# Patient Record
Sex: Female | Born: 1950 | Race: White | Hispanic: No | Marital: Married | Smoking: Never smoker
Health system: Southern US, Community
[De-identification: ages and names within clinical notes are randomized; demographics above are authoritative.]

## PROBLEM LIST (undated history)

## (undated) DIAGNOSIS — Z803 Family history of malignant neoplasm of breast: Secondary | ICD-10-CM

## (undated) DIAGNOSIS — Z8042 Family history of malignant neoplasm of prostate: Secondary | ICD-10-CM

## (undated) DIAGNOSIS — Z8041 Family history of malignant neoplasm of ovary: Secondary | ICD-10-CM

## (undated) DIAGNOSIS — R509 Fever, unspecified: Secondary | ICD-10-CM

## (undated) DIAGNOSIS — R911 Solitary pulmonary nodule: Secondary | ICD-10-CM

## (undated) DIAGNOSIS — C9110 Chronic lymphocytic leukemia of B-cell type not having achieved remission: Secondary | ICD-10-CM

## (undated) DIAGNOSIS — E213 Hyperparathyroidism, unspecified: Secondary | ICD-10-CM

## (undated) DIAGNOSIS — E039 Hypothyroidism, unspecified: Secondary | ICD-10-CM

## (undated) HISTORY — DX: Family history of malignant neoplasm of ovary: Z80.41

## (undated) HISTORY — DX: Family history of malignant neoplasm of breast: Z80.3

## (undated) HISTORY — PX: KNEE ARTHROSCOPY: SHX127

## (undated) HISTORY — PX: BREAST BIOPSY: SHX20

## (undated) HISTORY — PX: THYROIDECTOMY: SHX17

## (undated) HISTORY — DX: Chronic lymphocytic leukemia of B-cell type not having achieved remission: C91.10

## (undated) HISTORY — DX: Hypothyroidism, unspecified: E03.9

## (undated) HISTORY — DX: Hyperparathyroidism, unspecified: E21.3

## (undated) HISTORY — DX: Solitary pulmonary nodule: R91.1

## (undated) HISTORY — DX: Fever, unspecified: R50.9

## (undated) HISTORY — DX: Hypercalcemia: E83.52

## (undated) HISTORY — DX: Family history of malignant neoplasm of prostate: Z80.42

---

## 1998-01-20 ENCOUNTER — Ambulatory Visit (HOSPITAL_COMMUNITY): Admission: RE | Admit: 1998-01-20 | Discharge: 1998-01-20 | Payer: Self-pay | Admitting: Surgery

## 1998-03-02 ENCOUNTER — Ambulatory Visit (HOSPITAL_COMMUNITY): Admission: RE | Admit: 1998-03-02 | Discharge: 1998-03-02 | Payer: Self-pay | Admitting: Obstetrics and Gynecology

## 1998-03-25 ENCOUNTER — Ambulatory Visit (HOSPITAL_COMMUNITY): Admission: RE | Admit: 1998-03-25 | Discharge: 1998-03-25 | Payer: Self-pay | Admitting: Obstetrics and Gynecology

## 1998-03-30 ENCOUNTER — Ambulatory Visit (HOSPITAL_COMMUNITY): Admission: RE | Admit: 1998-03-30 | Discharge: 1998-03-30 | Payer: Self-pay | Admitting: Obstetrics and Gynecology

## 1998-04-01 ENCOUNTER — Other Ambulatory Visit: Admission: RE | Admit: 1998-04-01 | Discharge: 1998-04-01 | Payer: Self-pay | Admitting: Obstetrics and Gynecology

## 1998-12-01 ENCOUNTER — Ambulatory Visit (HOSPITAL_COMMUNITY): Admission: RE | Admit: 1998-12-01 | Discharge: 1998-12-01 | Payer: Self-pay | Admitting: Obstetrics and Gynecology

## 1998-12-01 ENCOUNTER — Encounter: Payer: Self-pay | Admitting: Obstetrics and Gynecology

## 1999-05-23 ENCOUNTER — Other Ambulatory Visit: Admission: RE | Admit: 1999-05-23 | Discharge: 1999-05-23 | Payer: Self-pay | Admitting: Obstetrics and Gynecology

## 1999-06-19 ENCOUNTER — Ambulatory Visit (HOSPITAL_COMMUNITY): Admission: RE | Admit: 1999-06-19 | Discharge: 1999-06-19 | Payer: Self-pay | Admitting: Obstetrics and Gynecology

## 1999-06-19 ENCOUNTER — Encounter: Payer: Self-pay | Admitting: Obstetrics and Gynecology

## 1999-09-05 ENCOUNTER — Other Ambulatory Visit: Admission: RE | Admit: 1999-09-05 | Discharge: 1999-09-05 | Payer: Self-pay | Admitting: Obstetrics and Gynecology

## 1999-09-05 ENCOUNTER — Encounter (INDEPENDENT_AMBULATORY_CARE_PROVIDER_SITE_OTHER): Payer: Self-pay | Admitting: Specialist

## 1999-09-12 ENCOUNTER — Encounter (INDEPENDENT_AMBULATORY_CARE_PROVIDER_SITE_OTHER): Payer: Self-pay

## 1999-09-12 ENCOUNTER — Ambulatory Visit (HOSPITAL_COMMUNITY): Admission: RE | Admit: 1999-09-12 | Discharge: 1999-09-12 | Payer: Self-pay | Admitting: Obstetrics and Gynecology

## 2000-05-23 ENCOUNTER — Other Ambulatory Visit: Admission: RE | Admit: 2000-05-23 | Discharge: 2000-05-23 | Payer: Self-pay | Admitting: Obstetrics and Gynecology

## 2000-09-18 ENCOUNTER — Encounter: Payer: Self-pay | Admitting: Surgery

## 2000-09-18 ENCOUNTER — Observation Stay (HOSPITAL_COMMUNITY): Admission: RE | Admit: 2000-09-18 | Discharge: 2000-09-19 | Payer: Self-pay | Admitting: Surgery

## 2000-09-18 ENCOUNTER — Encounter (INDEPENDENT_AMBULATORY_CARE_PROVIDER_SITE_OTHER): Payer: Self-pay | Admitting: Specialist

## 2000-11-29 ENCOUNTER — Encounter: Payer: Self-pay | Admitting: Obstetrics and Gynecology

## 2000-11-29 ENCOUNTER — Ambulatory Visit (HOSPITAL_COMMUNITY): Admission: RE | Admit: 2000-11-29 | Discharge: 2000-11-29 | Payer: Self-pay | Admitting: Obstetrics and Gynecology

## 2001-08-26 ENCOUNTER — Other Ambulatory Visit: Admission: RE | Admit: 2001-08-26 | Discharge: 2001-08-26 | Payer: Self-pay | Admitting: Obstetrics and Gynecology

## 2002-03-16 ENCOUNTER — Ambulatory Visit (HOSPITAL_COMMUNITY): Admission: RE | Admit: 2002-03-16 | Discharge: 2002-03-16 | Payer: Self-pay | Admitting: Obstetrics and Gynecology

## 2002-03-16 ENCOUNTER — Encounter: Payer: Self-pay | Admitting: Obstetrics and Gynecology

## 2004-02-16 ENCOUNTER — Ambulatory Visit (HOSPITAL_COMMUNITY): Admission: RE | Admit: 2004-02-16 | Discharge: 2004-02-16 | Payer: Self-pay | Admitting: Obstetrics and Gynecology

## 2004-10-16 ENCOUNTER — Ambulatory Visit (HOSPITAL_COMMUNITY): Admission: RE | Admit: 2004-10-16 | Discharge: 2004-10-16 | Payer: Self-pay | Admitting: Gastroenterology

## 2004-10-16 ENCOUNTER — Encounter (INDEPENDENT_AMBULATORY_CARE_PROVIDER_SITE_OTHER): Payer: Self-pay | Admitting: *Deleted

## 2005-04-19 ENCOUNTER — Ambulatory Visit (HOSPITAL_COMMUNITY): Admission: RE | Admit: 2005-04-19 | Discharge: 2005-04-19 | Payer: Self-pay | Admitting: Obstetrics and Gynecology

## 2006-04-23 ENCOUNTER — Ambulatory Visit (HOSPITAL_COMMUNITY): Admission: RE | Admit: 2006-04-23 | Discharge: 2006-04-23 | Payer: Self-pay | Admitting: Obstetrics and Gynecology

## 2006-04-30 ENCOUNTER — Encounter: Admission: RE | Admit: 2006-04-30 | Discharge: 2006-04-30 | Payer: Self-pay | Admitting: Obstetrics and Gynecology

## 2007-05-09 ENCOUNTER — Encounter: Admission: RE | Admit: 2007-05-09 | Discharge: 2007-05-09 | Payer: Self-pay | Admitting: Obstetrics and Gynecology

## 2008-05-10 ENCOUNTER — Encounter: Admission: RE | Admit: 2008-05-10 | Discharge: 2008-05-10 | Payer: Self-pay | Admitting: Obstetrics and Gynecology

## 2008-12-22 ENCOUNTER — Ambulatory Visit: Payer: Self-pay | Admitting: Oncology

## 2008-12-30 ENCOUNTER — Other Ambulatory Visit: Admission: RE | Admit: 2008-12-30 | Discharge: 2008-12-30 | Payer: Self-pay | Admitting: Oncology

## 2008-12-30 ENCOUNTER — Encounter: Payer: Self-pay | Admitting: Oncology

## 2008-12-30 LAB — COMPREHENSIVE METABOLIC PANEL
ALT: 38 U/L — ABNORMAL HIGH (ref 0–35)
CO2: 31 mEq/L (ref 19–32)
Calcium: 9.8 mg/dL (ref 8.4–10.5)
Chloride: 105 mEq/L (ref 96–112)
Creatinine, Ser: 0.6 mg/dL (ref 0.40–1.20)
Glucose, Bld: 102 mg/dL — ABNORMAL HIGH (ref 70–99)
Sodium: 140 mEq/L (ref 135–145)
Total Protein: 6.6 g/dL (ref 6.0–8.3)

## 2008-12-30 LAB — CBC WITH DIFFERENTIAL/PLATELET
Basophils Absolute: 0.1 10*3/uL (ref 0.0–0.1)
EOS%: 2.3 % (ref 0.0–7.0)
HCT: 40.8 % (ref 34.8–46.6)
HGB: 13.7 g/dL (ref 11.6–15.9)
LYMPH%: 56.6 % — ABNORMAL HIGH (ref 14.0–49.7)
MCH: 29.7 pg (ref 25.1–34.0)
MCV: 88.3 fL (ref 79.5–101.0)
MONO%: 3.6 % (ref 0.0–14.0)
NEUT%: 37 % — ABNORMAL LOW (ref 38.4–76.8)
Platelets: 233 10*3/uL (ref 145–400)

## 2008-12-30 LAB — LACTATE DEHYDROGENASE: LDH: 139 U/L (ref 94–250)

## 2008-12-30 LAB — MORPHOLOGY: PLT EST: ADEQUATE

## 2009-01-05 ENCOUNTER — Other Ambulatory Visit: Admission: RE | Admit: 2009-01-05 | Discharge: 2009-01-05 | Payer: Self-pay | Admitting: Oncology

## 2009-01-05 ENCOUNTER — Ambulatory Visit (HOSPITAL_COMMUNITY): Admission: RE | Admit: 2009-01-05 | Discharge: 2009-01-05 | Payer: Self-pay | Admitting: Oncology

## 2009-01-05 ENCOUNTER — Encounter: Payer: Self-pay | Admitting: Oncology

## 2009-01-05 LAB — FLOW CYTOMETRY

## 2009-01-20 LAB — CBC WITH DIFFERENTIAL/PLATELET
BASO%: 0.4 % (ref 0.0–2.0)
EOS%: 1.7 % (ref 0.0–7.0)
MCH: 29.5 pg (ref 25.1–34.0)
MCHC: 33.6 g/dL (ref 31.5–36.0)
RDW: 13.2 % (ref 11.2–14.5)
lymph#: 8.7 10*3/uL — ABNORMAL HIGH (ref 0.9–3.3)

## 2009-01-20 LAB — LACTATE DEHYDROGENASE: LDH: 154 U/L (ref 94–250)

## 2009-02-14 ENCOUNTER — Ambulatory Visit: Payer: Self-pay | Admitting: Oncology

## 2009-02-16 LAB — CBC WITH DIFFERENTIAL/PLATELET
Basophils Absolute: 0 10*3/uL (ref 0.0–0.1)
EOS%: 1.6 % (ref 0.0–7.0)
Eosinophils Absolute: 0.2 10*3/uL (ref 0.0–0.5)
HCT: 39.7 % (ref 34.8–46.6)
HGB: 13.5 g/dL (ref 11.6–15.9)
MCH: 29.6 pg (ref 25.1–34.0)
MONO#: 0.5 10*3/uL (ref 0.1–0.9)
NEUT#: 5.5 10*3/uL (ref 1.5–6.5)
NEUT%: 37.7 % — ABNORMAL LOW (ref 38.4–76.8)
lymph#: 8.4 10*3/uL — ABNORMAL HIGH (ref 0.9–3.3)

## 2009-03-22 ENCOUNTER — Ambulatory Visit: Payer: Self-pay | Admitting: Oncology

## 2009-03-24 LAB — COMPREHENSIVE METABOLIC PANEL
ALT: 32 U/L (ref 0–35)
CO2: 27 mEq/L (ref 19–32)
Creatinine, Ser: 0.65 mg/dL (ref 0.40–1.20)
Total Bilirubin: 0.3 mg/dL (ref 0.3–1.2)

## 2009-03-24 LAB — LACTATE DEHYDROGENASE: LDH: 176 U/L (ref 94–250)

## 2009-03-24 LAB — CBC WITH DIFFERENTIAL/PLATELET
Basophils Absolute: 0 10*3/uL (ref 0.0–0.1)
Eosinophils Absolute: 0.1 10*3/uL (ref 0.0–0.5)
HCT: 41.3 % (ref 34.8–46.6)
HGB: 13.9 g/dL (ref 11.6–15.9)
LYMPH%: 65.8 % — ABNORMAL HIGH (ref 14.0–49.7)
MCV: 87.9 fL (ref 79.5–101.0)
MONO#: 0.3 10*3/uL (ref 0.1–0.9)
MONO%: 2.8 % (ref 0.0–14.0)
NEUT#: 3 10*3/uL (ref 1.5–6.5)
NEUT%: 29.9 % — ABNORMAL LOW (ref 38.4–76.8)
Platelets: 206 10*3/uL (ref 145–400)
WBC: 9.9 10*3/uL (ref 3.9–10.3)

## 2009-03-24 LAB — CHCC SMEAR

## 2009-05-23 ENCOUNTER — Ambulatory Visit: Payer: Self-pay | Admitting: Oncology

## 2009-05-25 LAB — CBC WITH DIFFERENTIAL/PLATELET
Basophils Absolute: 0 10*3/uL (ref 0.0–0.1)
Eosinophils Absolute: 0.1 10*3/uL (ref 0.0–0.5)
HGB: 13.7 g/dL (ref 11.6–15.9)
MCV: 88.5 fL (ref 79.5–101.0)
MONO#: 0.5 10*3/uL (ref 0.1–0.9)
MONO%: 3 % (ref 0.0–14.0)
NEUT#: 5 10*3/uL (ref 1.5–6.5)
RBC: 4.56 10*6/uL (ref 3.70–5.45)
RDW: 13.6 % (ref 11.2–14.5)
WBC: 15.2 10*3/uL — ABNORMAL HIGH (ref 3.9–10.3)
lymph#: 9.6 10*3/uL — ABNORMAL HIGH (ref 0.9–3.3)

## 2009-06-07 ENCOUNTER — Encounter: Admission: RE | Admit: 2009-06-07 | Discharge: 2009-06-07 | Payer: Self-pay | Admitting: Obstetrics and Gynecology

## 2009-08-02 ENCOUNTER — Ambulatory Visit: Payer: Self-pay | Admitting: Oncology

## 2009-08-04 LAB — CBC WITH DIFFERENTIAL/PLATELET
Basophils Absolute: 0 10*3/uL (ref 0.0–0.1)
Eosinophils Absolute: 0.2 10*3/uL (ref 0.0–0.5)
HCT: 42.9 % (ref 34.8–46.6)
HGB: 14.5 g/dL (ref 11.6–15.9)
MCH: 30.4 pg (ref 25.1–34.0)
MONO#: 0.5 10*3/uL (ref 0.1–0.9)
NEUT%: 30.3 % — ABNORMAL LOW (ref 38.4–76.8)
WBC: 16.6 10*3/uL — ABNORMAL HIGH (ref 3.9–10.3)
lymph#: 10.9 10*3/uL — ABNORMAL HIGH (ref 0.9–3.3)

## 2009-08-04 LAB — COMPREHENSIVE METABOLIC PANEL
AST: 26 U/L (ref 0–37)
BUN: 13 mg/dL (ref 6–23)
Calcium: 10.5 mg/dL (ref 8.4–10.5)
Chloride: 105 mEq/L (ref 96–112)
Creatinine, Ser: 0.64 mg/dL (ref 0.40–1.20)
Glucose, Bld: 61 mg/dL — ABNORMAL LOW (ref 70–99)

## 2009-08-04 LAB — MORPHOLOGY: RBC Comments: NORMAL

## 2009-09-22 ENCOUNTER — Ambulatory Visit: Payer: Self-pay | Admitting: Oncology

## 2009-09-27 LAB — CBC WITH DIFFERENTIAL/PLATELET
BASO%: 0.1 % (ref 0.0–2.0)
Basophils Absolute: 0 10*3/uL (ref 0.0–0.1)
LYMPH%: 62.1 % — ABNORMAL HIGH (ref 14.0–49.7)
MCHC: 33.8 g/dL (ref 31.5–36.0)
MONO#: 0.6 10*3/uL (ref 0.1–0.9)
NEUT#: 6.8 10*3/uL — ABNORMAL HIGH (ref 1.5–6.5)
NEUT%: 33.9 % — ABNORMAL LOW (ref 38.4–76.8)
Platelets: 247 10*3/uL (ref 145–400)
WBC: 20.2 10*3/uL — ABNORMAL HIGH (ref 3.9–10.3)
lymph#: 12.5 10*3/uL — ABNORMAL HIGH (ref 0.9–3.3)

## 2009-11-28 ENCOUNTER — Ambulatory Visit: Payer: Self-pay | Admitting: Oncology

## 2009-11-30 LAB — CBC WITH DIFFERENTIAL/PLATELET
Basophils Absolute: 0 10*3/uL (ref 0.0–0.1)
EOS%: 1.3 % (ref 0.0–7.0)
HGB: 13.6 g/dL (ref 11.6–15.9)
LYMPH%: 66 % — ABNORMAL HIGH (ref 14.0–49.7)
MCH: 30.5 pg (ref 25.1–34.0)
MCHC: 33.9 g/dL (ref 31.5–36.0)
MCV: 89.8 fL (ref 79.5–101.0)
MONO#: 0.4 10*3/uL (ref 0.1–0.9)
RBC: 4.45 10*6/uL (ref 3.70–5.45)
lymph#: 12.6 10*3/uL — ABNORMAL HIGH (ref 0.9–3.3)

## 2009-11-30 LAB — COMPREHENSIVE METABOLIC PANEL
ALT: 27 U/L (ref 0–35)
BUN: 14 mg/dL (ref 6–23)
Calcium: 10.1 mg/dL (ref 8.4–10.5)
Chloride: 106 mEq/L (ref 96–112)
Creatinine, Ser: 0.76 mg/dL (ref 0.40–1.20)
Potassium: 4 mEq/L (ref 3.5–5.3)

## 2009-11-30 LAB — LACTATE DEHYDROGENASE: LDH: 149 U/L (ref 94–250)

## 2010-01-27 ENCOUNTER — Ambulatory Visit: Payer: Self-pay | Admitting: Oncology

## 2010-01-30 LAB — CBC WITH DIFFERENTIAL/PLATELET
Eosinophils Absolute: 0.4 10*3/uL (ref 0.0–0.5)
HCT: 41.6 % (ref 34.8–46.6)
MCHC: 33.6 g/dL (ref 31.5–36.0)
MONO#: 0.3 10*3/uL (ref 0.1–0.9)
NEUT%: 26.9 % — ABNORMAL LOW (ref 38.4–76.8)
RBC: 4.61 10*6/uL (ref 3.70–5.45)
RDW: 13.3 % (ref 11.2–14.5)
WBC: 19.1 10*3/uL — ABNORMAL HIGH (ref 3.9–10.3)
lymph#: 13.3 10*3/uL — ABNORMAL HIGH (ref 0.9–3.3)

## 2010-03-24 ENCOUNTER — Ambulatory Visit: Payer: Self-pay | Admitting: Oncology

## 2010-03-29 LAB — CBC WITH DIFFERENTIAL/PLATELET
BASO%: 0.3 % (ref 0.0–2.0)
HCT: 43.3 % (ref 34.8–46.6)
HGB: 14.6 g/dL (ref 11.6–15.9)
LYMPH%: 68.1 % — ABNORMAL HIGH (ref 14.0–49.7)
MCH: 30.3 pg (ref 25.1–34.0)
MCHC: 33.8 g/dL (ref 31.5–36.0)
MONO#: 0.3 10*3/uL (ref 0.1–0.9)
MONO%: 1.4 % (ref 0.0–14.0)
RBC: 4.84 10*6/uL (ref 3.70–5.45)

## 2010-03-29 LAB — COMPREHENSIVE METABOLIC PANEL
ALT: 36 U/L — ABNORMAL HIGH (ref 0–35)
AST: 35 U/L (ref 0–37)
Albumin: 4.3 g/dL (ref 3.5–5.2)
Calcium: 10.6 mg/dL — ABNORMAL HIGH (ref 8.4–10.5)
Creatinine, Ser: 0.71 mg/dL (ref 0.40–1.20)
Glucose, Bld: 87 mg/dL (ref 70–99)
Potassium: 4.3 mEq/L (ref 3.5–5.3)
Sodium: 139 mEq/L (ref 135–145)

## 2010-06-08 ENCOUNTER — Encounter: Admission: RE | Admit: 2010-06-08 | Discharge: 2010-06-08 | Payer: Self-pay | Admitting: Obstetrics and Gynecology

## 2010-06-26 ENCOUNTER — Ambulatory Visit: Payer: Self-pay | Admitting: Oncology

## 2010-06-28 LAB — LACTATE DEHYDROGENASE: LDH: 158 U/L (ref 94–250)

## 2010-06-28 LAB — CBC WITH DIFFERENTIAL/PLATELET
BASO%: 0.3 % (ref 0.0–2.0)
Eosinophils Absolute: 0.1 10*3/uL (ref 0.0–0.5)
MCH: 30.8 pg (ref 25.1–34.0)
MCHC: 34.7 g/dL (ref 31.5–36.0)
MCV: 88.8 fL (ref 79.5–101.0)
MONO%: 2.1 % (ref 0.0–14.0)
NEUT#: 6.9 10*3/uL — ABNORMAL HIGH (ref 1.5–6.5)
NEUT%: 28.1 % — ABNORMAL LOW (ref 38.4–76.8)
Platelets: 278 10*3/uL (ref 145–400)
RBC: 4.78 10*6/uL (ref 3.70–5.45)
RDW: 13.5 % (ref 11.2–14.5)
WBC: 24.6 10*3/uL — ABNORMAL HIGH (ref 3.9–10.3)
lymph#: 17 10*3/uL — ABNORMAL HIGH (ref 0.9–3.3)

## 2010-06-28 LAB — COMPREHENSIVE METABOLIC PANEL
Alkaline Phosphatase: 122 U/L — ABNORMAL HIGH (ref 39–117)
BUN: 11 mg/dL (ref 6–23)
Calcium: 10.5 mg/dL (ref 8.4–10.5)
Chloride: 104 mEq/L (ref 96–112)
Glucose, Bld: 90 mg/dL (ref 70–99)
Potassium: 4.2 mEq/L (ref 3.5–5.3)
Sodium: 139 mEq/L (ref 135–145)
Total Protein: 6.4 g/dL (ref 6.0–8.3)

## 2010-07-06 LAB — VITAMIN D 25 HYDROXY (VIT D DEFICIENCY, FRACTURES): Vit D, 25-Hydroxy: 30 ng/mL (ref 30–89)

## 2010-09-21 ENCOUNTER — Ambulatory Visit: Payer: Self-pay | Admitting: Oncology

## 2010-09-27 LAB — CBC WITH DIFFERENTIAL/PLATELET
BASO%: 0.2 % (ref 0.0–2.0)
Basophils Absolute: 0.1 10*3/uL (ref 0.0–0.1)
EOS%: 0.8 % (ref 0.0–7.0)
Eosinophils Absolute: 0.2 10*3/uL (ref 0.0–0.5)
HCT: 43.7 % (ref 34.8–46.6)
HGB: 14.7 g/dL (ref 11.6–15.9)
LYMPH%: 71.7 % — ABNORMAL HIGH (ref 14.0–49.7)
MCH: 29.8 pg (ref 25.1–34.0)
MCHC: 33.6 g/dL (ref 31.5–36.0)
MCV: 88.6 fL (ref 79.5–101.0)
MONO#: 0.4 10*3/uL (ref 0.1–0.9)
MONO%: 1.7 % (ref 0.0–14.0)
NEUT#: 6.3 10*3/uL (ref 1.5–6.5)
NEUT%: 25.6 % — ABNORMAL LOW (ref 38.4–76.8)
Platelets: 244 10*3/uL (ref 145–400)
RBC: 4.93 10*6/uL (ref 3.70–5.45)
RDW: 13.4 % (ref 11.2–14.5)
WBC: 24.6 10*3/uL — ABNORMAL HIGH (ref 3.9–10.3)
lymph#: 17.6 10*3/uL — ABNORMAL HIGH (ref 0.9–3.3)

## 2010-09-27 LAB — LACTATE DEHYDROGENASE: LDH: 150 U/L (ref 94–250)

## 2010-09-27 LAB — COMPREHENSIVE METABOLIC PANEL
ALT: 42 U/L — ABNORMAL HIGH (ref 0–35)
AST: 27 U/L (ref 0–37)
Albumin: 4.4 g/dL (ref 3.5–5.2)
Alkaline Phosphatase: 108 U/L (ref 39–117)
BUN: 12 mg/dL (ref 6–23)
CO2: 25 mEq/L (ref 19–32)
Calcium: 9.9 mg/dL (ref 8.4–10.5)
Chloride: 103 mEq/L (ref 96–112)
Creatinine, Ser: 0.66 mg/dL (ref 0.40–1.20)
Glucose, Bld: 112 mg/dL — ABNORMAL HIGH (ref 70–99)
Potassium: 4.3 mEq/L (ref 3.5–5.3)
Sodium: 139 mEq/L (ref 135–145)
Total Bilirubin: 0.3 mg/dL (ref 0.3–1.2)
Total Protein: 7 g/dL (ref 6.0–8.3)

## 2010-09-27 LAB — TECHNOLOGIST REVIEW

## 2010-10-15 ENCOUNTER — Encounter: Payer: Self-pay | Admitting: Obstetrics and Gynecology

## 2010-10-16 ENCOUNTER — Encounter: Payer: Self-pay | Admitting: Obstetrics and Gynecology

## 2010-12-27 ENCOUNTER — Encounter (HOSPITAL_BASED_OUTPATIENT_CLINIC_OR_DEPARTMENT_OTHER): Payer: Managed Care, Other (non HMO) | Admitting: Oncology

## 2010-12-27 ENCOUNTER — Other Ambulatory Visit: Payer: Self-pay | Admitting: Oncology

## 2010-12-27 DIAGNOSIS — C9110 Chronic lymphocytic leukemia of B-cell type not having achieved remission: Secondary | ICD-10-CM

## 2010-12-27 LAB — COMPREHENSIVE METABOLIC PANEL
ALT: 26 U/L (ref 0–35)
AST: 24 U/L (ref 0–37)
Albumin: 4.2 g/dL (ref 3.5–5.2)
Chloride: 105 mEq/L (ref 96–112)
Creatinine, Ser: 0.83 mg/dL (ref 0.40–1.20)
Glucose, Bld: 127 mg/dL — ABNORMAL HIGH (ref 70–99)

## 2010-12-27 LAB — CBC WITH DIFFERENTIAL/PLATELET
BASO%: 0.2 % (ref 0.0–2.0)
Basophils Absolute: 0.1 10*3/uL (ref 0.0–0.1)
EOS%: 0.8 % (ref 0.0–7.0)
HCT: 41 % (ref 34.8–46.6)
MCH: 30.1 pg (ref 25.1–34.0)
MONO%: 1.7 % (ref 0.0–14.0)
NEUT#: 6.6 10*3/uL — ABNORMAL HIGH (ref 1.5–6.5)
NEUT%: 27.9 % — ABNORMAL LOW (ref 38.4–76.8)
RDW: 13.5 % (ref 11.2–14.5)

## 2010-12-27 LAB — LACTATE DEHYDROGENASE: LDH: 148 U/L (ref 94–250)

## 2011-01-03 LAB — DIFFERENTIAL
Basophils Relative: 0 % (ref 0–1)
Eosinophils Relative: 1 % (ref 0–5)
Lymphocytes Relative: 53 % — ABNORMAL HIGH (ref 12–46)
Lymphs Abs: 8.1 10*3/uL — ABNORMAL HIGH (ref 0.7–4.0)
Monocytes Relative: 3 % (ref 3–12)
Neutrophils Relative %: 43 % (ref 43–77)

## 2011-01-03 LAB — TISSUE HYBRIDIZATION (BONE MARROW)-NCBH

## 2011-01-03 LAB — CBC: WBC: 15.4 10*3/uL — ABNORMAL HIGH (ref 4.0–10.5)

## 2011-02-09 NOTE — Op Note (Signed)
[name], Kaitlin Barnes                 ACCOUNT NO.:  [id number]   MEDICAL RECORD NO.:  [id number]          PATIENT TYPE:  AMB   LOCATION:  ENDO                         FACILITY:  [facility]   PHYSICIAN:  Petra Kuba, M.D.    DATE OF BIRTH:  March 23, 1951   DATE OF PROCEDURE:  10/16/2004  DATE OF DISCHARGE:                                 OPERATIVE REPORT   PROCEDURE:  Colonoscopy.   INDICATION:  Screening.   Consent was signed after the risks, benefits, methods, and options were  thoroughly discussed in the office.   MEDICINE USED:  Demerol 100 mg, Versed 10 mg.   DESCRIPTION OF PROCEDURE:  Rectal inspection was pertinent for external  hemorrhoids.  Digital exam was negative.  The pediatric video adjustable  colonoscope was inserted and with abdominal pressure and rolling her on her  back was able to be advanced to the cecum.  On insertion in the mid  transverse, a small polyp was seen and was hot biopsied x1.  No other  abnormalities were seen as we advanced to the cecum which was identified by  the appendiceal orifice and the ileocecal valve.  The scope was slowly  withdrawn.  The prep was adequate, there was some liquid stool that required  washing and suctioning.  There was a tiny polyp in the proximal ascending  which was cold biopsied x2 and put in the same container.  The scope was  slowly withdrawn and no other abnormalities were seen.  As we withdrew back  to the polyp that was hot biopsied no obvious residual polypoid tissue was  seen.  The scope was slowly withdrawn and in the mid descending another  tiny, small polyp was seen and was hot biopsied x1 and put in a separate  container.  The scope was further withdrawn, no additional findings were  seen.  As we slowly withdrew back to the rectum, anorectal pull-through and  retroflexion confirmed some small hemorrhoids.  The scope was  straightened  and readvanced to the left side of the colon, air was suctioned and the  scope  removed.  The patient tolerated the procedure well and there were no  obvious immediate complications.   ENDOSCOPIC DIAGNOSES:  1.  Internal and external hemorrhoids.  2.  Three tiny, small polyps, cold biopsied in the ascending, hot biopsied      in the transverse and descending.  3.  Otherwise within normal limits to the rectum.   PLAN:  Await pathology, probably recheck colon in 5 years, happy to see back  p.r.n., otherwise return care to Dr. Tenny Craw with customary healthcare  maintenance to include yearly rectals and guaiacs.                                               ______________________________  Petra Kuba, M.D.    MEM/MEDQ  D:  10/16/2004  T:  10/16/2004  Job:  [id number]   cc:   C.  Duane Lope, M.D.  [address]  [place]  [place] [address]  Fax: [phone]

## 2011-04-11 ENCOUNTER — Other Ambulatory Visit: Payer: Self-pay | Admitting: Oncology

## 2011-04-11 ENCOUNTER — Encounter (HOSPITAL_BASED_OUTPATIENT_CLINIC_OR_DEPARTMENT_OTHER): Payer: BC Managed Care – PPO | Admitting: Oncology

## 2011-04-11 DIAGNOSIS — C9110 Chronic lymphocytic leukemia of B-cell type not having achieved remission: Secondary | ICD-10-CM

## 2011-04-11 LAB — COMPREHENSIVE METABOLIC PANEL
ALT: 29 U/L (ref 0–35)
CO2: 26 mEq/L (ref 19–32)
Creatinine, Ser: 0.68 mg/dL (ref 0.50–1.10)
Total Bilirubin: 0.3 mg/dL (ref 0.3–1.2)

## 2011-04-11 LAB — CBC WITH DIFFERENTIAL/PLATELET
BASO%: 0.2 % (ref 0.0–2.0)
HCT: 40.6 % (ref 34.8–46.6)
LYMPH%: 70.5 % — ABNORMAL HIGH (ref 14.0–49.7)
MCH: 30.6 pg (ref 25.1–34.0)
MCHC: 34 g/dL (ref 31.5–36.0)
MCV: 89.9 fL (ref 79.5–101.0)
MONO#: 0.2 10*3/uL (ref 0.1–0.9)
NEUT%: 27.7 % — ABNORMAL LOW (ref 38.4–76.8)
Platelets: 231 10*3/uL (ref 145–400)
WBC: 24 10*3/uL — ABNORMAL HIGH (ref 3.9–10.3)

## 2011-04-11 LAB — LACTATE DEHYDROGENASE: LDH: 153 U/L (ref 94–250)

## 2011-05-10 ENCOUNTER — Other Ambulatory Visit: Payer: Self-pay | Admitting: Obstetrics and Gynecology

## 2011-05-10 DIAGNOSIS — Z1231 Encounter for screening mammogram for malignant neoplasm of breast: Secondary | ICD-10-CM

## 2011-06-12 ENCOUNTER — Ambulatory Visit: Payer: BC Managed Care – PPO

## 2011-06-15 ENCOUNTER — Ambulatory Visit
Admission: RE | Admit: 2011-06-15 | Discharge: 2011-06-15 | Disposition: A | Discharge status: Home / Self Care | Payer: BC Managed Care – PPO | Source: Ambulatory Visit | Attending: Obstetrics and Gynecology | Admitting: Obstetrics and Gynecology

## 2011-06-15 DIAGNOSIS — Z1231 Encounter for screening mammogram for malignant neoplasm of breast: Secondary | ICD-10-CM

## 2011-07-09 ENCOUNTER — Other Ambulatory Visit: Payer: Self-pay | Admitting: Oncology

## 2011-07-09 ENCOUNTER — Encounter (HOSPITAL_BASED_OUTPATIENT_CLINIC_OR_DEPARTMENT_OTHER): Payer: BC Managed Care – PPO | Admitting: Oncology

## 2011-07-09 DIAGNOSIS — D689 Coagulation defect, unspecified: Secondary | ICD-10-CM

## 2011-07-09 DIAGNOSIS — C9110 Chronic lymphocytic leukemia of B-cell type not having achieved remission: Secondary | ICD-10-CM

## 2011-07-09 LAB — CBC WITH DIFFERENTIAL/PLATELET
BASO%: 0.3 % (ref 0.0–2.0)
EOS%: 0.7 % (ref 0.0–7.0)
Eosinophils Absolute: 0.2 10*3/uL (ref 0.0–0.5)
LYMPH%: 72.5 % — ABNORMAL HIGH (ref 14.0–49.7)
MCH: 30.7 pg (ref 25.1–34.0)
MCHC: 34.1 g/dL (ref 31.5–36.0)
MCV: 90 fL (ref 79.5–101.0)
MONO%: 1.3 % (ref 0.0–14.0)
NEUT#: 7.1 10*3/uL — ABNORMAL HIGH (ref 1.5–6.5)
RBC: 4.67 10*6/uL (ref 3.70–5.45)
RDW: 13.5 % (ref 11.2–14.5)

## 2011-07-26 ENCOUNTER — Other Ambulatory Visit: Payer: Self-pay | Admitting: Obstetrics and Gynecology

## 2011-09-08 ENCOUNTER — Telehealth: Payer: Self-pay | Admitting: Oncology

## 2011-09-08 NOTE — Telephone Encounter (Signed)
lmonvm adviisng the pt of her jan 2013 appts and to pick up the rest of her appts at that time

## 2011-10-06 ENCOUNTER — Encounter: Payer: Self-pay | Admitting: Oncology

## 2011-10-08 ENCOUNTER — Telehealth: Payer: Self-pay | Admitting: Oncology

## 2011-10-08 ENCOUNTER — Other Ambulatory Visit: Payer: Self-pay | Admitting: Oncology

## 2011-10-08 ENCOUNTER — Other Ambulatory Visit: Payer: BC Managed Care – PPO | Admitting: Lab

## 2011-10-08 ENCOUNTER — Ambulatory Visit (HOSPITAL_BASED_OUTPATIENT_CLINIC_OR_DEPARTMENT_OTHER): Payer: BC Managed Care – PPO | Admitting: Oncology

## 2011-10-08 VITALS — BP 129/73 | HR 79 | Temp 97.5°F | Ht 63.0 in | Wt 178.1 lb

## 2011-10-08 DIAGNOSIS — E039 Hypothyroidism, unspecified: Secondary | ICD-10-CM

## 2011-10-08 DIAGNOSIS — C9110 Chronic lymphocytic leukemia of B-cell type not having achieved remission: Secondary | ICD-10-CM

## 2011-10-08 DIAGNOSIS — D689 Coagulation defect, unspecified: Secondary | ICD-10-CM

## 2011-10-08 LAB — CBC WITH DIFFERENTIAL/PLATELET
BASO%: 0.2 % (ref 0.0–2.0)
Eosinophils Absolute: 0.2 10*3/uL (ref 0.0–0.5)
MCHC: 33.8 g/dL (ref 31.5–36.0)
MONO#: 0.6 10*3/uL (ref 0.1–0.9)
NEUT#: 7.9 10*3/uL — ABNORMAL HIGH (ref 1.5–6.5)
RBC: 4.48 10*6/uL (ref 3.70–5.45)
RDW: 13.5 % (ref 11.2–14.5)
WBC: 27 10*3/uL — ABNORMAL HIGH (ref 3.9–10.3)

## 2011-10-08 LAB — COMPREHENSIVE METABOLIC PANEL
ALT: 39 U/L — ABNORMAL HIGH (ref 0–35)
AST: 35 U/L (ref 0–37)
CO2: 29 mEq/L (ref 19–32)
Calcium: 10 mg/dL (ref 8.4–10.5)
Chloride: 107 mEq/L (ref 96–112)
Creatinine, Ser: 0.87 mg/dL (ref 0.50–1.10)
Sodium: 143 mEq/L (ref 135–145)
Total Bilirubin: 0.3 mg/dL (ref 0.3–1.2)
Total Protein: 6.1 g/dL (ref 6.0–8.3)

## 2011-10-08 LAB — CHCC SMEAR

## 2011-10-08 LAB — LACTATE DEHYDROGENASE: LDH: 151 U/L (ref 94–250)

## 2011-10-08 NOTE — Telephone Encounter (Signed)
appt made for 5/13 for lab and 9/13 for lab and md   aom prinred

## 2011-10-09 NOTE — Progress Notes (Signed)
[facility] OFFICE PROGRESS NOTE  Cc:  [name], MD, MD  DIAGNOSIS: Stage 0 chronic lymphocytic leukemia.  CURRENT THERAPY:  watchful observation.  INTERVAL HISTORY: JALISE ZAWISTOWSKI 61 y.o. female returns for regular follow up.  She reports feeling well.  She has been in retirement.  She spends a lot of time volunteering with her church.  She has chronic fatigue that predated the diagnosis of CLL; she is able to be independent of all activities of daily living.  She denies anorexia, weight loss, night sweat, palpable node swelling, CP, SOB, DOE, palpitation, abdominal pain, nausea/vomiting, hematemasis, hemoptysis, melena, hematochezia, hematuria, vaginal bleeding, skin rash, joint pain, joint swelling, diarrhea, constipation, heat/cold intolerance, depression.   MEDICAL HISTORY: Past Medical History  Diagnosis Date  . CLL (chronic lymphoblastic leukemia)   . Hypothyroidism   . Hypercalcemia   . Hyperparathyroidism, unspecified     SURGICAL HISTORY: No past surgical history on file.  MEDICATIONS: Current Outpatient Prescriptions  Medication Sig Dispense Refill  . alendronate (FOSAMAX) 35 MG tablet Take 35 mg by mouth every 7 (seven) days. Take with a full glass of water on an empty stomach.      Marland Kitchen aspirin 81 MG tablet Take 160 mg by mouth daily.      . cholecalciferol (VITAMIN D) 1000 UNITS tablet Take 1,000 Units by mouth daily.      . ferrous sulfate dried (SLOW FE) 160 (50 FE) MG TBCR Take 160 mg by mouth daily.      Marland Kitchen levothyroxine (SYNTHROID, LEVOTHROID) 88 MCG tablet Take 88 mcg by mouth daily.      . Multiple Vitamin (MULTIVITAMIN) tablet Take 1 tablet by mouth daily.        ALLERGIES:   has no known allergies.  REVIEW OF SYSTEMS:  The rest of the 14-point review of system was negative.   Filed Vitals:   10/08/11 1448  BP: 129/73  Pulse: 79  Temp: 97.5 F (36.4 C)   Wt Readings from Last 3 Encounters:  10/08/11 178 lb 1.6 oz (80.786 kg)    ECOG Performance status: 0  PHYSICAL EXAMINATION:  General:  well-nourished in no acute distress.  Eyes:  no scleral icterus.  ENT:  There were no oropharyngeal lesions.  Neck was without thyromegaly.  Lymphatics:  Negative cervical, supraclavicular or axillary adenopathy.  Respiratory: lungs were clear bilaterally without wheezing or crackles.  Cardiovascular:  Regular rate and rhythm, S1/S2, without murmur, rub or gallop.  There was no pedal edema.  GI:  abdomen was soft, flat, nontender, nondistended, without organomegaly.  Muscoloskeletal:  no spinal tenderness of palpation of vertebral spine.  Skin exam was without echymosis, petichae.  Neuro exam was nonfocal.  Patient was able to get on and off exam table without assistance.  Gait was normal.  Patient was alerted and oriented.  Attention was good.   Language was appropriate.  Mood was normal without depression.  Speech was not pressured.  Thought content was not tangential.      LABORATORY/RADIOLOGY DATA:  Lab Results  Component Value Date   WBC 27.0* 10/08/2011   HGB 13.8 10/08/2011   HCT 40.8 10/08/2011   PLT 230 10/08/2011   GLUCOSE 75 10/08/2011   ALT 39* 10/08/2011   AST 35 10/08/2011   NA 143 10/08/2011   K 4.2 10/08/2011   CL 107 10/08/2011   CREATININE 0.87 10/08/2011   BUN 17 10/08/2011   CO2 29 10/08/2011     ASSESSMENT AND PLAN:  1. CLL Stage 0.  I discussed with Ms. Junio that per clinical history, physical exam, laboratory tests, she has still stage 0 CLL.  She does not have  lymphadenopathy, splenomegaly, anemia, thrombocytopenia.  Her fatigue is not B symptom to indicate treatment at this time for CLL.  I recommended again watchful observation.  2. Hypothyroidism.  She is on levothyroxine  per Dr. Sharl Ma her PCP.  Supposedly these are being titrated. 3. History of hyper calcium with elevated PTH.  I deferred to Dr. Sharl Ma for treatment.  She is on Fosamax.  I am unsure whether she has osteoporosis or not.  4. Primary care:   She is one year overdue for her age-appropriate colon cancer screening.  She claims that she is up-to-date with her mammogram and she is overdue for her Pap smear.  Would encourage her to discuss with Dr. Tenny Craw for arrangement.    5. Followup:  She has an appointment with Eunice Blase, PA-C in six months and with me in one year.   She has appointments for lab only in 3 months and 9 months.

## 2012-01-07 ENCOUNTER — Other Ambulatory Visit: Payer: BC Managed Care – PPO | Admitting: Lab

## 2012-02-05 ENCOUNTER — Other Ambulatory Visit (HOSPITAL_BASED_OUTPATIENT_CLINIC_OR_DEPARTMENT_OTHER): Payer: BC Managed Care – PPO | Admitting: Lab

## 2012-02-05 DIAGNOSIS — C9110 Chronic lymphocytic leukemia of B-cell type not having achieved remission: Secondary | ICD-10-CM

## 2012-02-05 LAB — CBC WITH DIFFERENTIAL/PLATELET
BASO%: 0.2 % (ref 0.0–2.0)
Basophils Absolute: 0 10*3/uL (ref 0.0–0.1)
EOS%: 0.8 % (ref 0.0–7.0)
HCT: 41.5 % (ref 34.8–46.6)
HGB: 13.5 g/dL (ref 11.6–15.9)
MCH: 29.8 pg (ref 25.1–34.0)
MCHC: 32.5 g/dL (ref 31.5–36.0)
MCV: 91.9 fL (ref 79.5–101.0)
MONO%: 1.8 % (ref 0.0–14.0)
NEUT%: 22.8 % — ABNORMAL LOW (ref 38.4–76.8)
lymph#: 20.5 10*3/uL — ABNORMAL HIGH (ref 0.9–3.3)

## 2012-02-12 ENCOUNTER — Telehealth: Payer: Self-pay

## 2012-02-12 NOTE — Telephone Encounter (Signed)
Message copied by Kallie Locks on Tue Feb 12, 2012  3:08 PM ------      Message from: Jethro Bolus T      Created: Tue Feb 05, 2012  3:02 PM       Please call pt.  Her leukocytosis is stable (this is from CLL).  As she has had not symptoms, we have been observing her.  Thanks.

## 2012-04-07 ENCOUNTER — Ambulatory Visit: Payer: BC Managed Care – PPO | Admitting: Oncology

## 2012-04-07 ENCOUNTER — Other Ambulatory Visit: Payer: BC Managed Care – PPO

## 2012-05-21 ENCOUNTER — Other Ambulatory Visit: Payer: Self-pay | Admitting: Obstetrics and Gynecology

## 2012-05-21 DIAGNOSIS — Z1231 Encounter for screening mammogram for malignant neoplasm of breast: Secondary | ICD-10-CM

## 2012-06-12 ENCOUNTER — Encounter: Payer: Self-pay | Admitting: Oncology

## 2012-06-12 ENCOUNTER — Ambulatory Visit (HOSPITAL_BASED_OUTPATIENT_CLINIC_OR_DEPARTMENT_OTHER): Payer: BC Managed Care – PPO | Admitting: Oncology

## 2012-06-12 ENCOUNTER — Other Ambulatory Visit (HOSPITAL_BASED_OUTPATIENT_CLINIC_OR_DEPARTMENT_OTHER): Payer: BC Managed Care – PPO | Admitting: Lab

## 2012-06-12 ENCOUNTER — Telehealth: Payer: Self-pay | Admitting: Oncology

## 2012-06-12 VITALS — BP 144/80 | HR 83 | Temp 97.8°F | Resp 20 | Ht 63.0 in | Wt 168.4 lb

## 2012-06-12 DIAGNOSIS — C9110 Chronic lymphocytic leukemia of B-cell type not having achieved remission: Secondary | ICD-10-CM

## 2012-06-12 DIAGNOSIS — R5381 Other malaise: Secondary | ICD-10-CM

## 2012-06-12 HISTORY — DX: Chronic lymphocytic leukemia of B-cell type not having achieved remission: C91.10

## 2012-06-12 LAB — CBC WITH DIFFERENTIAL/PLATELET
BASO%: 0.2 % (ref 0.0–2.0)
EOS%: 0.6 % (ref 0.0–7.0)
HCT: 42 % (ref 34.8–46.6)
LYMPH%: 77.6 % — ABNORMAL HIGH (ref 14.0–49.7)
MCH: 30.3 pg (ref 25.1–34.0)
MCHC: 33.2 g/dL (ref 31.5–36.0)
MCV: 91.4 fL (ref 79.5–101.0)
MONO%: 1.5 % (ref 0.0–14.0)
NEUT%: 20.1 % — ABNORMAL LOW (ref 38.4–76.8)
Platelets: 209 10*3/uL (ref 145–400)
RBC: 4.6 10*6/uL (ref 3.70–5.45)

## 2012-06-12 LAB — COMPREHENSIVE METABOLIC PANEL (CC13)
ALT: 49 U/L (ref 0–55)
AST: 28 U/L (ref 5–34)
Alkaline Phosphatase: 112 U/L (ref 40–150)
CO2: 29 mEq/L (ref 22–29)
Creatinine: 0.9 mg/dL (ref 0.6–1.1)
Sodium: 142 mEq/L (ref 136–145)
Total Bilirubin: 0.3 mg/dL (ref 0.20–1.20)

## 2012-06-12 LAB — LACTATE DEHYDROGENASE (CC13): LDH: 171 U/L (ref 125–220)

## 2012-06-12 NOTE — Progress Notes (Signed)
[facility]  Telephone:([phone] Fax:([phone]   OFFICE PROGRESS NOTE   Cc:  [name], MD  DIAGNOSIS: Stage 0 chronic lymphocytic leukemia.   CURRENT THERAPY: watchful observation.  INTERVAL HISTORY: Kaitlin Barnes 61 y.o. female returns for regular follow up.  She again reports fatigue.  However, she is still able to perform all chores around the house and activities of daily living.  She has been volunteering a lot of her time at her church. She has been trying to lose weight by diet and exercise and has lost weight successfully.    Patient denies fever, anorexia, weight loss, headache, visual changes, confusion, drenching night sweats, palpable lymph node swelling, mucositis, odynophagia, dysphagia, nausea vomiting, jaundice, chest pain, palpitation, shortness of breath, dyspnea on exertion, productive cough, gum bleeding, epistaxis, hematemesis, hemoptysis, abdominal pain, abdominal swelling, early satiety, melena, hematochezia, hematuria, skin rash, spontaneous bleeding, joint swelling, joint pain, heat or cold intolerance, bowel bladder incontinence, back pain, focal motor weakness, paresthesia, depression, suicidal or homicidal ideation, feeling hopelessness.   Past Medical History  Diagnosis Date  . CLL (chronic lymphoblastic leukemia)   . Hypothyroidism   . Hypercalcemia   . Hyperparathyroidism, unspecified   . CLL (chronic lymphocytic leukemia) 06/12/2012    No past surgical history on file.  Current Outpatient Prescriptions  Medication Sig Dispense Refill  . alendronate (FOSAMAX) 35 MG tablet Take 35 mg by mouth every 7 (seven) days. Take with a full glass of water on an empty stomach.      Marland Kitchen aspirin 81 MG tablet Take 160 mg by mouth daily.      . cholecalciferol (VITAMIN D) 1000 UNITS tablet Take 1,000 Units by mouth daily.      . ferrous sulfate dried (SLOW FE) 160 (50 FE) MG TBCR Take 160 mg by mouth daily.      Marland Kitchen levothyroxine  (SYNTHROID, LEVOTHROID) 88 MCG tablet Take 88 mcg by mouth daily.      . Multiple Vitamin (MULTIVITAMIN) tablet Take 1 tablet by mouth daily.        ALLERGIES:   has no known allergies.  REVIEW OF SYSTEMS:  The rest of the 14-point review of system was negative.   Filed Vitals:   06/12/12 1514  BP: 144/80  Pulse: 83  Temp: 97.8 F (36.6 C)  Resp: 20   Wt Readings from Last 3 Encounters:  06/12/12 168 lb 6.4 oz (76.386 kg)  10/08/11 178 lb 1.6 oz (80.786 kg)   ECOG Performance status: 1  PHYSICAL EXAMINATION:   General:  well-nourished woman, in no acute distress.  Eyes:  no scleral icterus.  ENT:  There were no oropharyngeal lesions.  Neck was without thyromegaly.  Lymphatics:  Negative cervical, supraclavicular or axillary adenopathy.  Respiratory: lungs were clear bilaterally without wheezing or crackles.  Cardiovascular:  Regular rate and rhythm, S1/S2, without murmur, rub or gallop.  There was no pedal edema.  GI:  abdomen was soft, flat, nontender, nondistended, without organomegaly.  Muscoloskeletal:  no spinal tenderness of palpation of vertebral spine.  Skin exam was without echymosis, petichae.  Neuro exam was nonfocal.  Patient was able to get on and off exam table without assistance.  Gait was normal.  Patient was alerted and oriented.  Attention was good.   Language was appropriate.  Mood was normal without depression.  Speech was not pressured.  Thought content was not tangential.         LABORATORY/RADIOLOGY DATA:  Lab Results  Component Value Date   WBC 32.1* 06/12/2012   HGB 14.0 06/12/2012   HCT 42.0 06/12/2012   PLT 209 06/12/2012   GLUCOSE 102* 06/12/2012   ALKPHOS 112 06/12/2012   ALT 49 06/12/2012   AST 28 06/12/2012   NA 142 06/12/2012   K 4.2 06/12/2012   CL 106 06/12/2012   CREATININE 0.9 06/12/2012   BUN 16.0 06/12/2012   CO2 29 06/12/2012    ASSESSMENT AND PLAN:   1. CLL Stage 0.  Again, there is no indication for treatment.  Her lymphocytes is now  higher than before.  However, her doubling time is 3 years.  She has mild fatigue; but she is very active.  This mild level of fatigue alone without cytopenia, recurrent infection, bulky adenopathy does not warrant treatment with chemo yet.  She agreed to continue to observe.   2. Hypothyroidism. She is on levothyroxine per Dr. Sharl Ma.  3. History of hypercalcemia with elevated PTH. I deferred to Dr. Sharl Ma for treatment. She is on Fosamax. I am unsure whether she has osteoporosis or not.  4. Age appropriate cancer screening: mammogram next month.  Colonoscopy is not due yet per her report.  She has had multiple neg PAPsmear and no longer needs them per her report.  5. Followup: lab only appointment in 6 months; and return visit in 1 year.     The length of time of the face-to-face encounter was 15  minutes. More than 50% of time was spent counseling and coordination of care.

## 2012-06-12 NOTE — Telephone Encounter (Signed)
Gave pt appt calendar for lab on March 2014

## 2012-06-12 NOTE — Patient Instructions (Signed)
1.  CLL - stage 0. Slight elevation in lymphocytes counts but still no indication for treatment.  There is no severe constitutional symptoms, recurrent infection, large node swelling.   2.  Follow up:  Lab only in about 6 months. Return visit in about 1 year.

## 2012-06-17 ENCOUNTER — Ambulatory Visit
Admission: RE | Admit: 2012-06-17 | Discharge: 2012-06-17 | Disposition: A | Discharge status: Home / Self Care | Payer: BC Managed Care – PPO | Source: Ambulatory Visit | Attending: Obstetrics and Gynecology | Admitting: Obstetrics and Gynecology

## 2012-06-17 DIAGNOSIS — Z1231 Encounter for screening mammogram for malignant neoplasm of breast: Secondary | ICD-10-CM

## 2012-06-18 ENCOUNTER — Other Ambulatory Visit: Payer: Self-pay | Admitting: Obstetrics and Gynecology

## 2012-06-18 DIAGNOSIS — N63 Unspecified lump in unspecified breast: Secondary | ICD-10-CM

## 2012-07-15 ENCOUNTER — Ambulatory Visit
Admission: RE | Admit: 2012-07-15 | Discharge: 2012-07-15 | Disposition: A | Discharge status: Home / Self Care | Payer: BC Managed Care – PPO | Source: Ambulatory Visit | Attending: Obstetrics and Gynecology | Admitting: Obstetrics and Gynecology

## 2012-07-15 DIAGNOSIS — N63 Unspecified lump in unspecified breast: Secondary | ICD-10-CM

## 2012-11-08 ENCOUNTER — Other Ambulatory Visit: Payer: Self-pay

## 2012-12-11 ENCOUNTER — Other Ambulatory Visit (HOSPITAL_BASED_OUTPATIENT_CLINIC_OR_DEPARTMENT_OTHER): Payer: BC Managed Care – PPO | Admitting: Lab

## 2012-12-11 DIAGNOSIS — C9110 Chronic lymphocytic leukemia of B-cell type not having achieved remission: Secondary | ICD-10-CM

## 2012-12-11 LAB — CBC WITH DIFFERENTIAL/PLATELET
Basophils Absolute: 0.1 10*3/uL (ref 0.0–0.1)
Eosinophils Absolute: 0.2 10*3/uL (ref 0.0–0.5)
HCT: 42.8 % (ref 34.8–46.6)
HGB: 14.2 g/dL (ref 11.6–15.9)
MCV: 90 fL (ref 79.5–101.0)
MONO%: 1.8 % (ref 0.0–14.0)
NEUT#: 6.2 10*3/uL (ref 1.5–6.5)
NEUT%: 19.3 % — ABNORMAL LOW (ref 38.4–76.8)
RDW: 13 % (ref 11.2–14.5)
lymph#: 25.2 10*3/uL — ABNORMAL HIGH (ref 0.9–3.3)

## 2012-12-11 LAB — COMPREHENSIVE METABOLIC PANEL (CC13)
Albumin: 3.7 g/dL (ref 3.5–5.0)
BUN: 16 mg/dL (ref 7.0–26.0)
Calcium: 10.7 mg/dL — ABNORMAL HIGH (ref 8.4–10.4)
Chloride: 104 mEq/L (ref 98–107)
Glucose: 94 mg/dl (ref 70–99)
Potassium: 4.3 mEq/L (ref 3.5–5.1)

## 2012-12-11 LAB — TECHNOLOGIST REVIEW

## 2013-06-09 ENCOUNTER — Telehealth: Payer: Self-pay | Admitting: Hematology and Oncology

## 2013-06-09 NOTE — Telephone Encounter (Signed)
Moved 9/18 appt to 10/23 lb/NG. S/w pt re new d/for 10/23 @ 11:30am.

## 2013-06-11 ENCOUNTER — Ambulatory Visit: Payer: BC Managed Care – PPO | Admitting: Oncology

## 2013-06-11 ENCOUNTER — Other Ambulatory Visit: Payer: BC Managed Care – PPO | Admitting: Lab

## 2013-06-29 ENCOUNTER — Other Ambulatory Visit: Payer: Self-pay

## 2013-06-29 DIAGNOSIS — Z1231 Encounter for screening mammogram for malignant neoplasm of breast: Secondary | ICD-10-CM

## 2013-07-15 ENCOUNTER — Other Ambulatory Visit: Payer: Self-pay | Admitting: Hematology and Oncology

## 2013-07-15 DIAGNOSIS — C9110 Chronic lymphocytic leukemia of B-cell type not having achieved remission: Secondary | ICD-10-CM

## 2013-07-16 ENCOUNTER — Encounter: Payer: Self-pay | Admitting: Hematology and Oncology

## 2013-07-16 ENCOUNTER — Telehealth: Payer: Self-pay | Admitting: *Deleted

## 2013-07-16 ENCOUNTER — Other Ambulatory Visit (HOSPITAL_BASED_OUTPATIENT_CLINIC_OR_DEPARTMENT_OTHER): Payer: BC Managed Care – PPO | Admitting: Lab

## 2013-07-16 ENCOUNTER — Ambulatory Visit (HOSPITAL_BASED_OUTPATIENT_CLINIC_OR_DEPARTMENT_OTHER): Payer: BC Managed Care – PPO | Admitting: Hematology and Oncology

## 2013-07-16 VITALS — BP 158/88 | HR 70 | Temp 97.2°F | Resp 20 | Ht 63.0 in | Wt 175.6 lb

## 2013-07-16 DIAGNOSIS — C9110 Chronic lymphocytic leukemia of B-cell type not having achieved remission: Secondary | ICD-10-CM

## 2013-07-16 DIAGNOSIS — R911 Solitary pulmonary nodule: Secondary | ICD-10-CM

## 2013-07-16 HISTORY — DX: Solitary pulmonary nodule: R91.1

## 2013-07-16 LAB — CBC WITH DIFFERENTIAL/PLATELET
BASO%: 0.1 % (ref 0.0–2.0)
Basophils Absolute: 0 10*3/uL (ref 0.0–0.1)
EOS%: 0.6 % (ref 0.0–7.0)
HCT: 42.4 % (ref 34.8–46.6)
HGB: 13.9 g/dL (ref 11.6–15.9)
MCH: 29.6 pg (ref 25.1–34.0)
MONO#: 0.5 10*3/uL (ref 0.1–0.9)
NEUT%: 17.3 % — ABNORMAL LOW (ref 38.4–76.8)
RBC: 4.7 10*6/uL (ref 3.70–5.45)
RDW: 13.1 % (ref 11.2–14.5)
WBC: 29.7 10*3/uL — ABNORMAL HIGH (ref 3.9–10.3)
lymph#: 23.8 10*3/uL — ABNORMAL HIGH (ref 0.9–3.3)

## 2013-07-16 LAB — COMPREHENSIVE METABOLIC PANEL (CC13)
ALT: 41 U/L (ref 0–55)
AST: 29 U/L (ref 5–34)
Albumin: 3.7 g/dL (ref 3.5–5.0)
Anion Gap: 7 mEq/L (ref 3–11)
BUN: 15.9 mg/dL (ref 7.0–26.0)
CO2: 26 mEq/L (ref 22–29)
Calcium: 10.7 mg/dL — ABNORMAL HIGH (ref 8.4–10.4)
Chloride: 107 mEq/L (ref 98–109)
Potassium: 4.4 mEq/L (ref 3.5–5.1)
Sodium: 141 mEq/L (ref 136–145)

## 2013-07-16 NOTE — Progress Notes (Signed)
[facility] OFFICE PROGRESS NOTE  Patient Care Team: Duane Lope, MD as PCP - General (Family Medicine) Talmage Coin, MD as Attending Physician (Internal Medicine)  DIAGNOSIS: Stage 0 CLL  SUMMARY OF ONCOLOGIC HISTORY: This is a very pleasant 62 year old lady was diagnosed with CLL since 2010. She was noted to have chronic leukocytosis. In 2010, peripheral blood flow cytometry confirmed the diagnosis of CLL. CT scan of the chest, abdomen, and pelvis show no evidence of lymphadenopathy. She was given a stage 0 and please on observation  INTERVAL HISTORY: Kaitlin Barnes 62 y.o. female returns for further followup.  She denies any recent fever, chills, night sweats or abnormal weight loss The patient denies any recent signs or symptoms of bleeding such as spontaneous epistaxis, hematuria or hematochezia. No history of recurrent infection. For her screening programs are up-to-date apart from Pap smear. She has not had a Pap smear for 3-4 years.  I have reviewed the past medical history, past surgical history, social history and family history with the patient and they are unchanged from previous note.  ALLERGIES:  has No Known Allergies.  MEDICATIONS:  Current Outpatient Prescriptions  Medication Sig Dispense Refill  . alendronate (FOSAMAX) 70 MG tablet Take 70 mg by mouth every 7 (seven) days. Take with a full glass of water on an empty stomach.      Marland Kitchen aspirin 81 MG tablet Take 160 mg by mouth daily.      . cholecalciferol (VITAMIN D) 1000 UNITS tablet Take 1,000 Units by mouth daily.      . ferrous sulfate dried (SLOW FE) 160 (50 FE) MG TBCR Take 160 mg by mouth daily.      Marland Kitchen levothyroxine (SYNTHROID, LEVOTHROID) 88 MCG tablet Take 88 mcg by mouth daily.      . Multiple Vitamin (MULTIVITAMIN) tablet Take 1 tablet by mouth daily.       No current facility-administered medications for this visit.    REVIEW OF SYSTEMS:   Constitutional: Denies fevers, chills or abnormal  weight loss Eyes: Denies blurriness of vision Ears, nose, mouth, throat, and face: Denies mucositis or sore throat Respiratory: Denies cough, dyspnea or wheezes Cardiovascular: Denies palpitation, chest discomfort or lower extremity swelling Gastrointestinal:  Denies nausea, heartburn or change in bowel habits Skin: Denies abnormal skin rashes Lymphatics: Denies new lymphadenopathy or easy bruising Neurological:Denies numbness, tingling or new weaknesses Behavioral/Psych: Mood is stable, no new changes  All other systems were reviewed with the patient and are negative.  PHYSICAL EXAMINATION: ECOG PERFORMANCE STATUS: 0 - Asymptomatic  Filed Vitals:   07/16/13 1200  BP: 158/88  Pulse: 70  Temp: 97.2 F (36.2 C)  Resp: 20   Filed Weights   07/16/13 1200  Weight: 175 lb 9.6 oz (79.652 kg)    GENERAL:alert, no distress and comfortable SKIN: skin color, texture, turgor are normal, no rashes or significant lesions EYES: normal, Conjunctiva are pink and non-injected, sclera clear OROPHARYNX:no exudate, no erythema and lips, buccal mucosa, and tongue normal  NECK: supple, thyroid normal size, non-tender, without nodularity LYMPH:  no palpable lymphadenopathy in the cervical, axillary or inguinal LUNGS: clear to auscultation and percussion with normal breathing effort HEART: regular rate & rhythm and no murmurs and no lower extremity edema ABDOMEN:abdomen soft, non-tender and normal bowel sounds Musculoskeletal:no cyanosis of digits and no clubbing  NEURO: alert & oriented x 3 with fluent speech, no focal motor/sensory deficits  LABORATORY DATA:  I have reviewed the data as listed  Component Value Date/Time   NA 141 07/16/2013 1124   NA 143 10/08/2011 1412   K 4.4 07/16/2013 1124   K 4.2 10/08/2011 1412   CL 104 12/11/2012 1408   CL 107 10/08/2011 1412   CO2 26 07/16/2013 1124   CO2 29 10/08/2011 1412   GLUCOSE 68* 07/16/2013 1124   GLUCOSE 94 12/11/2012 1408   GLUCOSE 75  10/08/2011 1412   BUN 15.9 07/16/2013 1124   BUN 17 10/08/2011 1412   CREATININE 0.7 07/16/2013 1124   CREATININE 0.87 10/08/2011 1412   CALCIUM 10.7* 07/16/2013 1124   CALCIUM 10.0 10/08/2011 1412   CALCIUM 10.3 06/28/2010 1345   PROT 6.8 07/16/2013 1124   PROT 6.1 10/08/2011 1412   ALBUMIN 3.7 07/16/2013 1124   ALBUMIN 4.0 10/08/2011 1412   AST 29 07/16/2013 1124   AST 35 10/08/2011 1412   ALT 41 07/16/2013 1124   ALT 39* 10/08/2011 1412   ALKPHOS 104 07/16/2013 1124   ALKPHOS 91 10/08/2011 1412   BILITOT 0.30 07/16/2013 1124   BILITOT 0.3 10/08/2011 1412    No results found for this basename: SPEP, UPEP,  kappa and lambda light chains    Lab Results  Component Value Date   WBC 29.7* 07/16/2013   NEUTROABS 5.1 07/16/2013   HGB 13.9 07/16/2013   HCT 42.4 07/16/2013   MCV 90.2 07/16/2013   PLT 220 07/16/2013      Chemistry      Component Value Date/Time   NA 141 07/16/2013 1124   NA 143 10/08/2011 1412   K 4.4 07/16/2013 1124   K 4.2 10/08/2011 1412   CL 104 12/11/2012 1408   CL 107 10/08/2011 1412   CO2 26 07/16/2013 1124   CO2 29 10/08/2011 1412   BUN 15.9 07/16/2013 1124   BUN 17 10/08/2011 1412   CREATININE 0.7 07/16/2013 1124   CREATININE 0.87 10/08/2011 1412      Component Value Date/Time   CALCIUM 10.7* 07/16/2013 1124   CALCIUM 10.0 10/08/2011 1412   CALCIUM 10.3 06/28/2010 1345   ALKPHOS 104 07/16/2013 1124   ALKPHOS 91 10/08/2011 1412   AST 29 07/16/2013 1124   AST 35 10/08/2011 1412   ALT 41 07/16/2013 1124   ALT 39* 10/08/2011 1412   BILITOT 0.30 07/16/2013 1124   BILITOT 0.3 10/08/2011 1412       RADIOGRAPHIC STUDIES: I reviewed her imaging study from 2010 which show one lung nodule  ASSESSMENT:  #1 CLL #2 lung nodule  PLAN:  #1 CLL The patient had no evidence of disease progression. We will continue observation, history, physical examination, and blood work once a year. We discussed about the natural history of CLL. I educated the patient's signs  and symptoms to watch out for for disease progression such as unexplained weight loss, lymphadenopathy, or unexplained night sweats. #2 lung nodule The patient is concerned about difficulties obtaining insurance in the future due to her diagnosis of CLL. Her prior lung CT scans show one lung nodule. She decide to have this followup to make sure it has not grown. I would order a CT scan of the chest for evaluation and discuss result with the patient over the phone #3 history of anemia The patient is not anemic. She has been taking iron supplement for years. I've asked her to stop taking iron supplements. #4 preventive care The patient is due for influenza vaccination. She went to have that given by her primary care physician. Her mammogram is scheduled for  tomorrow.  Orders Placed This Encounter  Procedures  . CT Chest Wo Contrast    Standing Status: Future     Number of Occurrences:      Standing Expiration Date: 07/16/2014    Order Specific Question:  Reason for exam:    Answer:  lung nodule, r/o progression    Order Specific Question:  Preferred imaging location?    Answer:  [facility]  . Comprehensive metabolic panel    Standing Status: Future     Number of Occurrences:      Standing Expiration Date: 07/16/2014  . CBC with Differential    Standing Status: Future     Number of Occurrences:      Standing Expiration Date: 04/07/2014  . Lactate dehydrogenase    Standing Status: Future     Number of Occurrences:      Standing Expiration Date: 07/16/2014  . IgG, IgA, IgM    Standing Status: Future     Number of Occurrences:      Standing Expiration Date: 07/16/2014   All questions were answered. The patient knows to call the clinic with any problems, questions or concerns. No barriers to learning was detected. I spent 25 minutes counseling the patient face to face. The total time spent in the appointment was 40 minutes and more than 50% was on counseling and review of test  results     [facility], Kaitlin Fernicola, MD 07/16/2013 12:31 PM

## 2013-07-16 NOTE — Telephone Encounter (Signed)
appts made and printed. Pt is aware that cs will call w/ her CT appt...td

## 2013-07-17 ENCOUNTER — Ambulatory Visit
Admission: RE | Admit: 2013-07-17 | Discharge: 2013-07-17 | Disposition: A | Discharge status: Home / Self Care | Payer: BC Managed Care – PPO | Source: Ambulatory Visit

## 2013-07-17 DIAGNOSIS — Z1231 Encounter for screening mammogram for malignant neoplasm of breast: Secondary | ICD-10-CM

## 2013-07-17 LAB — IGG, IGA, IGM: IgG (Immunoglobin G), Serum: 797 mg/dL (ref 690–1700)

## 2013-07-21 ENCOUNTER — Ambulatory Visit (HOSPITAL_COMMUNITY): Payer: BC Managed Care – PPO

## 2013-07-21 ENCOUNTER — Encounter (HOSPITAL_COMMUNITY): Payer: Self-pay

## 2013-07-21 ENCOUNTER — Ambulatory Visit (HOSPITAL_COMMUNITY)
Admission: RE | Admit: 2013-07-21 | Discharge: 2013-07-21 | Disposition: A | Discharge status: Home / Self Care | Payer: Managed Care, Other (non HMO) | Source: Ambulatory Visit | Attending: Hematology and Oncology | Admitting: Hematology and Oncology

## 2013-07-21 DIAGNOSIS — K449 Diaphragmatic hernia without obstruction or gangrene: Secondary | ICD-10-CM | POA: Insufficient documentation

## 2013-07-21 DIAGNOSIS — R911 Solitary pulmonary nodule: Secondary | ICD-10-CM

## 2013-07-21 DIAGNOSIS — C9110 Chronic lymphocytic leukemia of B-cell type not having achieved remission: Secondary | ICD-10-CM | POA: Insufficient documentation

## 2013-07-21 DIAGNOSIS — M5124 Other intervertebral disc displacement, thoracic region: Secondary | ICD-10-CM | POA: Diagnosis not present

## 2013-07-21 DIAGNOSIS — R918 Other nonspecific abnormal finding of lung field: Secondary | ICD-10-CM | POA: Diagnosis present

## 2013-07-30 ENCOUNTER — Other Ambulatory Visit: Payer: Self-pay

## 2013-09-02 ENCOUNTER — Encounter (HOSPITAL_COMMUNITY): Payer: Self-pay

## 2013-09-02 ENCOUNTER — Other Ambulatory Visit (HOSPITAL_COMMUNITY): Payer: Self-pay | Admitting: Internal Medicine

## 2013-09-02 ENCOUNTER — Ambulatory Visit (HOSPITAL_COMMUNITY)
Admission: RE | Admit: 2013-09-02 | Discharge: 2013-09-02 | Disposition: A | Discharge status: Home / Self Care | Payer: Managed Care, Other (non HMO) | Source: Ambulatory Visit | Attending: Internal Medicine | Admitting: Internal Medicine

## 2013-09-02 DIAGNOSIS — E210 Primary hyperparathyroidism: Secondary | ICD-10-CM | POA: Diagnosis not present

## 2013-09-02 DIAGNOSIS — M899 Disorder of bone, unspecified: Secondary | ICD-10-CM | POA: Insufficient documentation

## 2013-09-02 DIAGNOSIS — K219 Gastro-esophageal reflux disease without esophagitis: Secondary | ICD-10-CM | POA: Diagnosis not present

## 2013-09-02 MED ORDER — SODIUM CHLORIDE 0.9 % IV SOLN
Freq: Once | INTRAVENOUS | Status: AC
  Administered 2013-09-02: 15:00:00 via INTRAVENOUS

## 2013-09-02 MED ORDER — ZOLEDRONIC ACID 5 MG/100ML IV SOLN
5.0000 mg | Freq: Once | INTRAVENOUS | Status: AC
  Administered 2013-09-02: 5 mg via INTRAVENOUS
  Filled 2013-09-02: qty 100

## 2013-09-08 ENCOUNTER — Telehealth: Payer: Self-pay | Admitting: *Deleted

## 2013-09-08 NOTE — Telephone Encounter (Signed)
Pt called requesting results of CT scan done 07/21/13.  Pt stated she would like for Dr. Bertis Ruddy to give pt results instead of sending through MyChart.  Pt stated she could not retrieve any results from MyChart at all. Pt's  Phone    [phone].

## 2014-01-14 ENCOUNTER — Other Ambulatory Visit (HOSPITAL_BASED_OUTPATIENT_CLINIC_OR_DEPARTMENT_OTHER): Payer: BC Managed Care – PPO

## 2014-01-14 DIAGNOSIS — C9110 Chronic lymphocytic leukemia of B-cell type not having achieved remission: Secondary | ICD-10-CM

## 2014-01-14 LAB — COMPREHENSIVE METABOLIC PANEL (CC13)
ALK PHOS: 100 U/L (ref 40–150)
ALT: 45 U/L (ref 0–55)
AST: 29 U/L (ref 5–34)
Albumin: 4 g/dL (ref 3.5–5.0)
Anion Gap: 8 mEq/L (ref 3–11)
BUN: 12.8 mg/dL (ref 7.0–26.0)
CO2: 26 mEq/L (ref 22–29)
Calcium: 10.5 mg/dL — ABNORMAL HIGH (ref 8.4–10.4)
Chloride: 109 mEq/L (ref 98–109)
Creatinine: 0.7 mg/dL (ref 0.6–1.1)
GLUCOSE: 82 mg/dL (ref 70–140)
POTASSIUM: 4.4 meq/L (ref 3.5–5.1)
SODIUM: 142 meq/L (ref 136–145)
TOTAL PROTEIN: 6.9 g/dL (ref 6.4–8.3)
Total Bilirubin: 0.34 mg/dL (ref 0.20–1.20)

## 2014-01-14 LAB — CBC WITH DIFFERENTIAL/PLATELET
BASO%: 0.1 % (ref 0.0–2.0)
Basophils Absolute: 0 10*3/uL (ref 0.0–0.1)
EOS%: 0.6 % (ref 0.0–7.0)
Eosinophils Absolute: 0.2 10*3/uL (ref 0.0–0.5)
HEMATOCRIT: 44.2 % (ref 34.8–46.6)
HGB: 14.5 g/dL (ref 11.6–15.9)
LYMPH%: 80.5 % — AB (ref 14.0–49.7)
MCH: 29.7 pg (ref 25.1–34.0)
MCHC: 32.9 g/dL (ref 31.5–36.0)
MCV: 90.4 fL (ref 79.5–101.0)
MONO#: 0.5 10*3/uL (ref 0.1–0.9)
MONO%: 1.6 % (ref 0.0–14.0)
NEUT#: 5.4 10*3/uL (ref 1.5–6.5)
NEUT%: 17.2 % — AB (ref 38.4–76.8)
PLATELETS: 228 10*3/uL (ref 145–400)
RBC: 4.89 10*6/uL (ref 3.70–5.45)
RDW: 13.4 % (ref 11.2–14.5)
WBC: 31.1 10*3/uL — AB (ref 3.9–10.3)
lymph#: 25 10*3/uL — ABNORMAL HIGH (ref 0.9–3.3)

## 2014-01-14 LAB — LACTATE DEHYDROGENASE (CC13): LDH: 158 U/L (ref 125–245)

## 2014-01-16 LAB — IGG, IGA, IGM
IGM, SERUM: 35 mg/dL — AB (ref 52–322)
IgA: 119 mg/dL (ref 69–380)
IgG (Immunoglobin G), Serum: 801 mg/dL (ref 690–1700)

## 2014-06-22 ENCOUNTER — Other Ambulatory Visit: Payer: Self-pay

## 2014-06-22 DIAGNOSIS — Z1231 Encounter for screening mammogram for malignant neoplasm of breast: Secondary | ICD-10-CM

## 2014-07-19 ENCOUNTER — Other Ambulatory Visit: Payer: Self-pay

## 2014-07-19 ENCOUNTER — Ambulatory Visit
Admission: RE | Admit: 2014-07-19 | Discharge: 2014-07-19 | Disposition: A | Discharge status: Home / Self Care | Payer: Medicare PPO | Source: Ambulatory Visit

## 2014-07-19 DIAGNOSIS — Z1231 Encounter for screening mammogram for malignant neoplasm of breast: Secondary | ICD-10-CM

## 2014-07-20 ENCOUNTER — Other Ambulatory Visit (HOSPITAL_BASED_OUTPATIENT_CLINIC_OR_DEPARTMENT_OTHER): Payer: Medicare PPO

## 2014-07-20 ENCOUNTER — Ambulatory Visit (HOSPITAL_BASED_OUTPATIENT_CLINIC_OR_DEPARTMENT_OTHER): Payer: Medicare PPO | Admitting: Hematology and Oncology

## 2014-07-20 ENCOUNTER — Other Ambulatory Visit (HOSPITAL_COMMUNITY)
Admission: RE | Admit: 2014-07-20 | Discharge: 2014-07-20 | Disposition: A | Discharge status: Home / Self Care | Payer: Medicare PPO | Source: Ambulatory Visit | Attending: Hematology and Oncology | Admitting: Hematology and Oncology

## 2014-07-20 ENCOUNTER — Other Ambulatory Visit: Payer: Self-pay | Admitting: Hematology and Oncology

## 2014-07-20 ENCOUNTER — Telehealth: Payer: Self-pay | Admitting: Hematology and Oncology

## 2014-07-20 ENCOUNTER — Encounter: Payer: Self-pay | Admitting: Hematology and Oncology

## 2014-07-20 VITALS — BP 150/59 | HR 85 | Temp 98.3°F | Resp 18 | Ht 63.0 in | Wt 178.4 lb

## 2014-07-20 DIAGNOSIS — C9110 Chronic lymphocytic leukemia of B-cell type not having achieved remission: Secondary | ICD-10-CM | POA: Insufficient documentation

## 2014-07-20 DIAGNOSIS — R928 Other abnormal and inconclusive findings on diagnostic imaging of breast: Secondary | ICD-10-CM | POA: Insufficient documentation

## 2014-07-20 LAB — COMPREHENSIVE METABOLIC PANEL (CC13)
ALT: 52 U/L (ref 0–55)
ANION GAP: 8 meq/L (ref 3–11)
AST: 28 U/L (ref 5–34)
Albumin: 3.9 g/dL (ref 3.5–5.0)
Alkaline Phosphatase: 106 U/L (ref 40–150)
BUN: 13.7 mg/dL (ref 7.0–26.0)
CALCIUM: 10.5 mg/dL — AB (ref 8.4–10.4)
CHLORIDE: 107 meq/L (ref 98–109)
CO2: 27 meq/L (ref 22–29)
CREATININE: 0.8 mg/dL (ref 0.6–1.1)
Glucose: 103 mg/dl (ref 70–140)
Potassium: 4 mEq/L (ref 3.5–5.1)
Sodium: 142 mEq/L (ref 136–145)
Total Bilirubin: 0.33 mg/dL (ref 0.20–1.20)
Total Protein: 6.6 g/dL (ref 6.4–8.3)

## 2014-07-20 LAB — CBC WITH DIFFERENTIAL/PLATELET
BASO%: 0.2 % (ref 0.0–2.0)
BASOS ABS: 0.1 10*3/uL (ref 0.0–0.1)
EOS%: 0.4 % (ref 0.0–7.0)
Eosinophils Absolute: 0.1 10*3/uL (ref 0.0–0.5)
HEMATOCRIT: 43.5 % (ref 34.8–46.6)
HEMOGLOBIN: 13.9 g/dL (ref 11.6–15.9)
LYMPH#: 26.6 10*3/uL — AB (ref 0.9–3.3)
LYMPH%: 80.6 % — ABNORMAL HIGH (ref 14.0–49.7)
MCH: 29.1 pg (ref 25.1–34.0)
MCHC: 31.8 g/dL (ref 31.5–36.0)
MCV: 91.4 fL (ref 79.5–101.0)
MONO#: 0.5 10*3/uL (ref 0.1–0.9)
MONO%: 1.4 % (ref 0.0–14.0)
NEUT#: 5.8 10*3/uL (ref 1.5–6.5)
NEUT%: 17.4 % — AB (ref 38.4–76.8)
Platelets: 244 10*3/uL (ref 145–400)
RBC: 4.76 10*6/uL (ref 3.70–5.45)
RDW: 13.6 % (ref 11.2–14.5)
WBC: 33.1 10*3/uL — AB (ref 3.9–10.3)

## 2014-07-20 LAB — TECHNOLOGIST REVIEW

## 2014-07-20 LAB — LACTATE DEHYDROGENASE (CC13): LDH: 177 U/L (ref 125–245)

## 2014-07-20 NOTE — Assessment & Plan Note (Signed)
Clinically, she has no signs of disease progression. I recommend yearly visit with history, physical examination and blood work.

## 2014-07-20 NOTE — Progress Notes (Signed)
[facility] OFFICE PROGRESS NOTE  Patient Care Team: Melinda Crutch, MD as PCP - General (Family Medicine) Delrae Rend, MD as Attending Physician (Internal Medicine)  SUMMARY OF ONCOLOGIC HISTORY: This is a very pleasant lady was diagnosed with CLL since 2010. She was noted to have chronic leukocytosis. In 2010, peripheral blood flow cytometry confirmed the diagnosis of CLL. CT scan of the chest, abdomen, and pelvis show no evidence of lymphadenopathy. She was given a stage 0 and placed on observation  INTERVAL HISTORY: Please see below for problem oriented charting. She feels well. Denies recent infection. No new lymphadenopathy.  REVIEW OF SYSTEMS:   Constitutional: Denies fevers, chills or abnormal weight loss Eyes: Denies blurriness of vision Ears, nose, mouth, throat, and face: Denies mucositis or sore throat Respiratory: Denies cough, dyspnea or wheezes Cardiovascular: Denies palpitation, chest discomfort or lower extremity swelling Gastrointestinal:  Denies nausea, heartburn or change in bowel habits Skin: Denies abnormal skin rashes Lymphatics: Denies new lymphadenopathy or easy bruising Neurological:Denies numbness, tingling or new weaknesses Behavioral/Psych: Mood is stable, no new changes  All other systems were reviewed with the patient and are negative.  I have reviewed the past medical history, past surgical history, social history and family history with the patient and they are unchanged from previous note.  ALLERGIES:  has No Known Allergies.  MEDICATIONS:  Current Outpatient Prescriptions  Medication Sig Dispense Refill  . alendronate (FOSAMAX) 70 MG tablet Take 70 mg by mouth every 7 (seven) days. Take with a full glass of water on an empty stomach.      Marland Kitchen aspirin 81 MG tablet Take 160 mg by mouth daily.      . cholecalciferol (VITAMIN D) 1000 UNITS tablet Take 1,000 Units by mouth daily.      Marland Kitchen levothyroxine (SYNTHROID, LEVOTHROID) 88 MCG tablet  Take 88 mcg by mouth daily.      . Multiple Vitamin (MULTIVITAMIN) tablet Take 1 tablet by mouth daily.      Marland Kitchen omeprazole (PRILOSEC) 20 MG capsule Take 20 mg by mouth daily.       No current facility-administered medications for this visit.    PHYSICAL EXAMINATION: ECOG PERFORMANCE STATUS: 0 - Asymptomatic  Filed Vitals:   07/20/14 1146  BP: 150/59  Pulse: 85  Temp: 98.3 F (36.8 C)  Resp: 18   Filed Weights   07/20/14 1146  Weight: 178 lb 6.4 oz (80.922 kg)    GENERAL:alert, no distress and comfortable SKIN: skin color, texture, turgor are normal, no rashes or significant lesions EYES: normal, Conjunctiva are pink and non-injected, sclera clear OROPHARYNX:no exudate, no erythema and lips, buccal mucosa, and tongue normal  NECK: supple, thyroid normal size, non-tender, without nodularity LYMPH:  no palpable lymphadenopathy in the cervical, axillary or inguinal LUNGS: clear to auscultation and percussion with normal breathing effort HEART: regular rate & rhythm and no murmurs and no lower extremity edema ABDOMEN:abdomen soft, non-tender and normal bowel sounds Musculoskeletal:no cyanosis of digits and no clubbing  NEURO: alert & oriented x 3 with fluent speech, no focal motor/sensory deficits  LABORATORY DATA:  I have reviewed the data as listed    Component Value Date/Time   NA 142 07/20/2014 1135   NA 143 10/08/2011 1412   K 4.0 07/20/2014 1135   K 4.2 10/08/2011 1412   CL 104 12/11/2012 1408   CL 107 10/08/2011 1412   CO2 27 07/20/2014 1135   CO2 29 10/08/2011 1412   GLUCOSE 103 07/20/2014 1135  GLUCOSE 94 12/11/2012 1408   GLUCOSE 75 10/08/2011 1412   BUN 13.7 07/20/2014 1135   BUN 17 10/08/2011 1412   CREATININE 0.8 07/20/2014 1135   CREATININE 0.87 10/08/2011 1412   CALCIUM 10.5* 07/20/2014 1135   CALCIUM 10.0 10/08/2011 1412   CALCIUM 10.3 06/28/2010 1345   PROT 6.6 07/20/2014 1135   PROT 6.1 10/08/2011 1412   ALBUMIN 3.9 07/20/2014 1135   ALBUMIN 4.0 10/08/2011  1412   AST 28 07/20/2014 1135   AST 35 10/08/2011 1412   ALT 52 07/20/2014 1135   ALT 39* 10/08/2011 1412   ALKPHOS 106 07/20/2014 1135   ALKPHOS 91 10/08/2011 1412   BILITOT 0.33 07/20/2014 1135   BILITOT 0.3 10/08/2011 1412    No results found for this basename: SPEP,  UPEP,   kappa and lambda light chains    Lab Results  Component Value Date   WBC 33.1* 07/20/2014   NEUTROABS 5.8 07/20/2014   HGB 13.9 07/20/2014   HCT 43.5 07/20/2014   MCV 91.4 07/20/2014   PLT 244 07/20/2014      Chemistry      Component Value Date/Time   NA 142 07/20/2014 1135   NA 143 10/08/2011 1412   K 4.0 07/20/2014 1135   K 4.2 10/08/2011 1412   CL 104 12/11/2012 1408   CL 107 10/08/2011 1412   CO2 27 07/20/2014 1135   CO2 29 10/08/2011 1412   BUN 13.7 07/20/2014 1135   BUN 17 10/08/2011 1412   CREATININE 0.8 07/20/2014 1135   CREATININE 0.87 10/08/2011 1412      Component Value Date/Time   CALCIUM 10.5* 07/20/2014 1135   CALCIUM 10.0 10/08/2011 1412   CALCIUM 10.3 06/28/2010 1345   ALKPHOS 106 07/20/2014 1135   ALKPHOS 91 10/08/2011 1412   AST 28 07/20/2014 1135   AST 35 10/08/2011 1412   ALT 52 07/20/2014 1135   ALT 39* 10/08/2011 1412   BILITOT 0.33 07/20/2014 1135   BILITOT 0.3 10/08/2011 1412       RADIOGRAPHIC STUDIES: I have personally reviewed the radiological images as listed and agreed with the findings in the report. Mm Screening Breast Tomo Bilateral  07/20/2014   CLINICAL DATA:  Screening.  EXAM: DIGITAL SCREENING BILATERAL MAMMOGRAM WITH 3D TOMO WITH CAD  COMPARISON:  Previous exam(s)  ACR Breast Density Category c: The breast tissue is heterogeneously dense, which may obscure small masses.  FINDINGS: In the right breast, a possible mass warrants further evaluation with ultrasound. In the left breast, no findings suspicious for malignancy. Images were processed with CAD.  IMPRESSION: Further evaluation is suggested for possible mass in the right breast.  RECOMMENDATION: Ultrasound of  the right breast. (Code:[id number])  The patient will be contacted regarding the findings, and additional imaging will be scheduled.  BI-RADS CATEGORY  0: Incomplete. Need additional imaging evaluation and/or prior mammograms for comparison.   Electronically Signed   By: Lovey Newcomer M.D.   On: 07/20/2014 13:11     ASSESSMENT & PLAN:  CLL (chronic lymphocytic leukemia) Clinically, she has no signs of disease progression. I recommend yearly visit with history, physical examination and blood work.  Abnormal mammogram She denies any recent abnormal breast examination, palpable mass, abnormal breast appearance or nipple changes She will proceed with additional workup as directed by the imaging center..    Orders Placed This Encounter  Procedures  . Comprehensive metabolic panel    Standing Status: Future     Number of Occurrences:  Standing Expiration Date: 08/24/2015  . CBC with Differential    Standing Status: Future     Number of Occurrences:      Standing Expiration Date: 08/24/2015  . Lactate dehydrogenase    Standing Status: Future     Number of Occurrences:      Standing Expiration Date: 08/24/2015   All questions were answered. The patient knows to call the clinic with any problems, questions or concerns. No barriers to learning was detected. I spent 15 minutes counseling the patient face to face. The total time spent in the appointment was 20 minutes and more than 50% was on counseling and review of test results     [facility], [place], MD 07/20/2014 4:46 PM

## 2014-07-20 NOTE — Assessment & Plan Note (Signed)
She denies any recent abnormal breast examination, palpable mass, abnormal breast appearance or nipple changes She will proceed with additional workup as directed by the imaging center.Marland Kitchen

## 2014-07-20 NOTE — Telephone Encounter (Signed)
lm with pt husband and advised on OCT 2016 appt....ok and aware

## 2014-07-21 ENCOUNTER — Other Ambulatory Visit: Payer: Self-pay | Admitting: Hematology and Oncology

## 2014-07-21 DIAGNOSIS — R928 Other abnormal and inconclusive findings on diagnostic imaging of breast: Secondary | ICD-10-CM

## 2014-07-27 LAB — TISSUE HYBRIDIZATION TO NCBH

## 2014-07-28 ENCOUNTER — Other Ambulatory Visit: Payer: Medicare PPO

## 2014-07-29 ENCOUNTER — Ambulatory Visit
Admission: RE | Admit: 2014-07-29 | Discharge: 2014-07-29 | Disposition: A | Discharge status: Home / Self Care | Payer: Medicare PPO | Source: Ambulatory Visit | Attending: Hematology and Oncology | Admitting: Hematology and Oncology

## 2014-07-29 DIAGNOSIS — R928 Other abnormal and inconclusive findings on diagnostic imaging of breast: Secondary | ICD-10-CM

## 2014-09-01 LAB — FISH, PERIPHERAL BLOOD

## 2015-01-06 ENCOUNTER — Other Ambulatory Visit: Payer: Self-pay | Admitting: Internal Medicine

## 2015-01-07 ENCOUNTER — Other Ambulatory Visit: Payer: Self-pay | Admitting: Internal Medicine

## 2015-02-07 ENCOUNTER — Other Ambulatory Visit: Payer: Self-pay | Admitting: Hematology and Oncology

## 2015-02-07 DIAGNOSIS — N63 Unspecified lump in unspecified breast: Secondary | ICD-10-CM

## 2015-02-11 ENCOUNTER — Other Ambulatory Visit: Payer: Self-pay | Admitting: Hematology and Oncology

## 2015-02-11 ENCOUNTER — Other Ambulatory Visit: Payer: Self-pay

## 2015-02-11 DIAGNOSIS — N63 Unspecified lump in unspecified breast: Secondary | ICD-10-CM

## 2015-02-15 ENCOUNTER — Ambulatory Visit
Admission: RE | Admit: 2015-02-15 | Discharge: 2015-02-15 | Disposition: A | Discharge status: Home / Self Care | Payer: PPO | Source: Ambulatory Visit | Attending: Hematology and Oncology | Admitting: Hematology and Oncology

## 2015-02-15 DIAGNOSIS — N63 Unspecified lump in unspecified breast: Secondary | ICD-10-CM

## 2015-07-21 ENCOUNTER — Telehealth: Payer: Self-pay | Admitting: Hematology and Oncology

## 2015-07-21 ENCOUNTER — Ambulatory Visit (HOSPITAL_BASED_OUTPATIENT_CLINIC_OR_DEPARTMENT_OTHER): Payer: PPO | Admitting: Hematology and Oncology

## 2015-07-21 ENCOUNTER — Encounter: Payer: Self-pay | Admitting: Hematology and Oncology

## 2015-07-21 ENCOUNTER — Other Ambulatory Visit (HOSPITAL_BASED_OUTPATIENT_CLINIC_OR_DEPARTMENT_OTHER): Payer: PPO

## 2015-07-21 DIAGNOSIS — C9110 Chronic lymphocytic leukemia of B-cell type not having achieved remission: Secondary | ICD-10-CM

## 2015-07-21 LAB — COMPREHENSIVE METABOLIC PANEL (CC13)
ALT: 30 U/L (ref 0–55)
AST: 22 U/L (ref 5–34)
Albumin: 3.9 g/dL (ref 3.5–5.0)
Alkaline Phosphatase: 97 U/L (ref 40–150)
Anion Gap: 5 mEq/L (ref 3–11)
BILIRUBIN TOTAL: 0.31 mg/dL (ref 0.20–1.20)
BUN: 13.7 mg/dL (ref 7.0–26.0)
CO2: 28 meq/L (ref 22–29)
Calcium: 10.5 mg/dL — ABNORMAL HIGH (ref 8.4–10.4)
Chloride: 109 mEq/L (ref 98–109)
Creatinine: 0.7 mg/dL (ref 0.6–1.1)
EGFR: 87 mL/min/{1.73_m2} — ABNORMAL LOW (ref >90)
GLUCOSE: 96 mg/dL (ref 70–140)
Potassium: 4.6 mEq/L (ref 3.5–5.1)
SODIUM: 141 meq/L (ref 136–145)
TOTAL PROTEIN: 6.5 g/dL (ref 6.4–8.3)

## 2015-07-21 LAB — CBC WITH DIFFERENTIAL/PLATELET
BASO%: 0.1 % (ref 0.0–2.0)
BASOS ABS: 0 10*3/uL (ref 0.0–0.1)
EOS%: 0.6 % (ref 0.0–7.0)
Eosinophils Absolute: 0.2 10*3/uL (ref 0.0–0.5)
HCT: 43.2 % (ref 34.8–46.6)
HGB: 13.9 g/dL (ref 11.6–15.9)
LYMPH%: 83.1 % — AB (ref 14.0–49.7)
MCH: 29.9 pg (ref 25.1–34.0)
MCHC: 32.2 g/dL (ref 31.5–36.0)
MCV: 92.9 fL (ref 79.5–101.0)
MONO#: 0.5 10*3/uL (ref 0.1–0.9)
MONO%: 1.4 % (ref 0.0–14.0)
NEUT#: 5 10*3/uL (ref 1.5–6.5)
NEUT%: 14.8 % — AB (ref 38.4–76.8)
Platelets: 237 10*3/uL (ref 145–400)
RBC: 4.65 10*6/uL (ref 3.70–5.45)
RDW: 13.8 % (ref 11.2–14.5)
WBC: 33.9 10*3/uL — ABNORMAL HIGH (ref 3.9–10.3)
lymph#: 28.2 10*3/uL — ABNORMAL HIGH (ref 0.9–3.3)

## 2015-07-21 LAB — LACTATE DEHYDROGENASE (CC13): LDH: 162 U/L (ref 125–245)

## 2015-07-21 LAB — TECHNOLOGIST REVIEW

## 2015-07-21 NOTE — Assessment & Plan Note (Signed)
Clinically, she has no signs of disease progression. I recommend yearly visit with history, physical examination and blood work.

## 2015-07-21 NOTE — Progress Notes (Signed)
[facility] OFFICE PROGRESS NOTE  Patient Care Team: Lona Kettle, MD as PCP - General (Family Medicine) Delrae Rend, MD as Attending Physician (Internal Medicine)  SUMMARY OF ONCOLOGIC HISTORY:   CLL (chronic lymphocytic leukemia) ([facility])   06/12/2012 Initial Diagnosis CLL (chronic lymphocytic leukemia)   07/20/2014 Pathology Results FISH was normal    INTERVAL HISTORY: Please see below for problem oriented charting. She returns for further follow-up. She denies new lymphadenopathy. No recent infection. The patient is undergoing a lot of stress as her husband was recently diagnosed with lymphoma and developed reaction to treatment.  her blood pressure is elevated today which she think is attributed to stress. She declined influenza vaccination today because of fear that she might get into trouble.  REVIEW OF SYSTEMS:   Constitutional: Denies fevers, chills or abnormal weight loss Eyes: Denies blurriness of vision Ears, nose, mouth, throat, and face: Denies mucositis or sore throat Respiratory: Denies cough, dyspnea or wheezes Cardiovascular: Denies palpitation, chest discomfort or lower extremity swelling Gastrointestinal:  Denies nausea, heartburn or change in bowel habits Skin: Denies abnormal skin rashes Lymphatics: Denies new lymphadenopathy or easy bruising Neurological:Denies numbness, tingling or new weaknesses Behavioral/Psych: Mood is stable, no new changes  All other systems were reviewed with the patient and are negative.  I have reviewed the past medical history, past surgical history, social history and family history with the patient and they are unchanged from previous note.  ALLERGIES:  has No Known Allergies.  MEDICATIONS:  Current Outpatient Prescriptions  Medication Sig Dispense Refill  . aspirin 81 MG tablet Take 160 mg by mouth daily.    . cholecalciferol (VITAMIN D) 1000 UNITS tablet Take 1,000 Units by mouth daily.    Marland Kitchen levothyroxine  (SYNTHROID, LEVOTHROID) 88 MCG tablet Take 88 mcg by mouth daily.    . Multiple Vitamin (MULTIVITAMIN) tablet Take 1 tablet by mouth daily.    Marland Kitchen omeprazole (PRILOSEC) 20 MG capsule Take 20 mg by mouth every other day.      No current facility-administered medications for this visit.    PHYSICAL EXAMINATION: ECOG PERFORMANCE STATUS: 0 - Asymptomatic GENERAL:alert, no distress and comfortable SKIN: skin color, texture, turgor are normal, no rashes or significant lesions EYES: normal, Conjunctiva are pink and non-injected, sclera clear OROPHARYNX:no exudate, no erythema and lips, buccal mucosa, and tongue normal  NECK: supple, thyroid normal size, non-tender, without nodularity LYMPH:  no palpable lymphadenopathy in the cervical, axillary or inguinal LUNGS: clear to auscultation and percussion with normal breathing effort HEART: regular rate & rhythm and no murmurs and no lower extremity edema ABDOMEN:abdomen soft, non-tender and normal bowel sounds Musculoskeletal:no cyanosis of digits and no clubbing  NEURO: alert & oriented x 3 with fluent speech, no focal motor/sensory deficits  LABORATORY DATA:  I have reviewed the data as listed    Component Value Date/Time   NA 142 07/20/2014 1135   NA 143 10/08/2011 1412   K 4.0 07/20/2014 1135   K 4.2 10/08/2011 1412   CL 104 12/11/2012 1408   CL 107 10/08/2011 1412   CO2 27 07/20/2014 1135   CO2 29 10/08/2011 1412   GLUCOSE 103 07/20/2014 1135   GLUCOSE 94 12/11/2012 1408   GLUCOSE 75 10/08/2011 1412   BUN 13.7 07/20/2014 1135   BUN 17 10/08/2011 1412   CREATININE 0.8 07/20/2014 1135   CREATININE 0.87 10/08/2011 1412   CALCIUM 10.5* 07/20/2014 1135   CALCIUM 10.0 10/08/2011 1412   CALCIUM 10.3 06/28/2010 1345  PROT 6.6 07/20/2014 1135   PROT 6.1 10/08/2011 1412   ALBUMIN 3.9 07/20/2014 1135   ALBUMIN 4.0 10/08/2011 1412   AST 28 07/20/2014 1135   AST 35 10/08/2011 1412   ALT 52 07/20/2014 1135   ALT 39* 10/08/2011 1412    ALKPHOS 106 07/20/2014 1135   ALKPHOS 91 10/08/2011 1412   BILITOT 0.33 07/20/2014 1135   BILITOT 0.3 10/08/2011 1412    No results found for: SPEP, UPEP  Lab Results  Component Value Date   WBC 33.9* 07/21/2015   NEUTROABS 5.0 07/21/2015   HGB 13.9 07/21/2015   HCT 43.2 07/21/2015   MCV 92.9 07/21/2015   PLT 237 07/21/2015      Chemistry      Component Value Date/Time   NA 142 07/20/2014 1135   NA 143 10/08/2011 1412   K 4.0 07/20/2014 1135   K 4.2 10/08/2011 1412   CL 104 12/11/2012 1408   CL 107 10/08/2011 1412   CO2 27 07/20/2014 1135   CO2 29 10/08/2011 1412   BUN 13.7 07/20/2014 1135   BUN 17 10/08/2011 1412   CREATININE 0.8 07/20/2014 1135   CREATININE 0.87 10/08/2011 1412      Component Value Date/Time   CALCIUM 10.5* 07/20/2014 1135   CALCIUM 10.0 10/08/2011 1412   CALCIUM 10.3 06/28/2010 1345   ALKPHOS 106 07/20/2014 1135   ALKPHOS 91 10/08/2011 1412   AST 28 07/20/2014 1135   AST 35 10/08/2011 1412   ALT 52 07/20/2014 1135   ALT 39* 10/08/2011 1412   BILITOT 0.33 07/20/2014 1135   BILITOT 0.3 10/08/2011 1412     ASSESSMENT & PLAN:  CLL (chronic lymphocytic leukemia) Clinically, she has no signs of disease progression. I recommend yearly visit with history, physical examination and blood work.     Orders Placed This Encounter  Procedures  . CBC with Differential/Platelet    Standing Status: Future     Number of Occurrences:      Standing Expiration Date: 08/24/2016  . Comprehensive metabolic panel    Standing Status: Future     Number of Occurrences:      Standing Expiration Date: 08/24/2016  . Lactate dehydrogenase    Standing Status: Future     Number of Occurrences:      Standing Expiration Date: 08/24/2016   All questions were answered. The patient knows to call the clinic with any problems, questions or concerns. No barriers to learning was detected. I spent 15 minutes counseling the patient face to face. The total time spent in the  appointment was 20 minutes and more than 50% was on counseling and review of test results     [facility], [place], MD 07/21/2015 11:01 AM

## 2015-07-21 NOTE — Telephone Encounter (Signed)
Gave and pritned appt sched and avs for pt for OCT 2017

## 2015-08-02 ENCOUNTER — Other Ambulatory Visit: Payer: Self-pay | Admitting: Hematology and Oncology

## 2015-08-02 ENCOUNTER — Other Ambulatory Visit: Payer: Self-pay

## 2015-08-02 DIAGNOSIS — N63 Unspecified lump in unspecified breast: Secondary | ICD-10-CM

## 2015-08-22 ENCOUNTER — Ambulatory Visit
Admission: RE | Admit: 2015-08-22 | Discharge: 2015-08-22 | Disposition: A | Discharge status: Home / Self Care | Payer: PPO | Source: Ambulatory Visit | Attending: Hematology and Oncology | Admitting: Hematology and Oncology

## 2015-08-22 DIAGNOSIS — N63 Unspecified lump in unspecified breast: Secondary | ICD-10-CM

## 2015-10-27 DIAGNOSIS — E210 Primary hyperparathyroidism: Secondary | ICD-10-CM | POA: Diagnosis not present

## 2015-10-27 DIAGNOSIS — E039 Hypothyroidism, unspecified: Secondary | ICD-10-CM | POA: Diagnosis not present

## 2015-10-27 DIAGNOSIS — M8580 Other specified disorders of bone density and structure, unspecified site: Secondary | ICD-10-CM | POA: Diagnosis not present

## 2015-10-31 DIAGNOSIS — M8580 Other specified disorders of bone density and structure, unspecified site: Secondary | ICD-10-CM | POA: Diagnosis not present

## 2015-10-31 DIAGNOSIS — E039 Hypothyroidism, unspecified: Secondary | ICD-10-CM | POA: Diagnosis not present

## 2015-10-31 DIAGNOSIS — E210 Primary hyperparathyroidism: Secondary | ICD-10-CM | POA: Diagnosis not present

## 2015-12-01 DIAGNOSIS — F430 Acute stress reaction: Secondary | ICD-10-CM | POA: Diagnosis not present

## 2015-12-01 DIAGNOSIS — R030 Elevated blood-pressure reading, without diagnosis of hypertension: Secondary | ICD-10-CM | POA: Diagnosis not present

## 2015-12-15 DIAGNOSIS — M8589 Other specified disorders of bone density and structure, multiple sites: Secondary | ICD-10-CM | POA: Diagnosis not present

## 2015-12-15 DIAGNOSIS — E210 Primary hyperparathyroidism: Secondary | ICD-10-CM | POA: Diagnosis not present

## 2015-12-15 DIAGNOSIS — M859 Disorder of bone density and structure, unspecified: Secondary | ICD-10-CM | POA: Diagnosis not present

## 2015-12-16 DIAGNOSIS — H2513 Age-related nuclear cataract, bilateral: Secondary | ICD-10-CM | POA: Diagnosis not present

## 2016-03-26 DIAGNOSIS — R5383 Other fatigue: Secondary | ICD-10-CM | POA: Diagnosis not present

## 2016-03-26 DIAGNOSIS — C9110 Chronic lymphocytic leukemia of B-cell type not having achieved remission: Secondary | ICD-10-CM | POA: Diagnosis not present

## 2016-03-26 DIAGNOSIS — E039 Hypothyroidism, unspecified: Secondary | ICD-10-CM | POA: Diagnosis not present

## 2016-03-28 ENCOUNTER — Telehealth: Payer: Self-pay | Admitting: *Deleted

## 2016-03-28 ENCOUNTER — Encounter: Payer: Self-pay | Admitting: Hematology and Oncology

## 2016-03-28 ENCOUNTER — Telehealth: Payer: Self-pay | Admitting: Hematology and Oncology

## 2016-03-28 ENCOUNTER — Other Ambulatory Visit: Payer: Self-pay | Admitting: Hematology and Oncology

## 2016-03-28 DIAGNOSIS — C9110 Chronic lymphocytic leukemia of B-cell type not having achieved remission: Secondary | ICD-10-CM

## 2016-03-28 DIAGNOSIS — R509 Fever, unspecified: Secondary | ICD-10-CM

## 2016-03-28 HISTORY — DX: Fever, unspecified: R50.9

## 2016-03-28 NOTE — Telephone Encounter (Signed)
Pt notified to go for CXR prior to lab/MD. Verbalized understanding

## 2016-03-28 NOTE — Telephone Encounter (Signed)
Dr Radene Ou would like to speak with you about Arcie. She can be reached at [phone]

## 2016-03-28 NOTE — Telephone Encounter (Signed)
-----   Message from [name], MD sent at 03/28/2016  1:08 PM EDT ----- Regarding: fever I placed orders for CXR and urine test on the same day I see her For now, just take Aleve as needed Please remind her to go to [facility] at 12 pm first before coming her for labs and see me

## 2016-03-28 NOTE — Telephone Encounter (Signed)
S.w. Pt and gv appt for 7.10..the patient ok and aware

## 2016-04-02 ENCOUNTER — Other Ambulatory Visit (HOSPITAL_BASED_OUTPATIENT_CLINIC_OR_DEPARTMENT_OTHER): Payer: PPO

## 2016-04-02 ENCOUNTER — Ambulatory Visit (HOSPITAL_COMMUNITY)
Admission: RE | Admit: 2016-04-02 | Discharge: 2016-04-02 | Disposition: A | Discharge status: Home / Self Care | Payer: PPO | Source: Ambulatory Visit | Attending: Hematology and Oncology | Admitting: Hematology and Oncology

## 2016-04-02 ENCOUNTER — Encounter: Payer: Self-pay | Admitting: Hematology and Oncology

## 2016-04-02 ENCOUNTER — Ambulatory Visit (HOSPITAL_BASED_OUTPATIENT_CLINIC_OR_DEPARTMENT_OTHER)
Admission: RE | Admit: 2016-04-02 | Discharge: 2016-04-02 | Disposition: A | Discharge status: Home / Self Care | Payer: PPO | Source: Ambulatory Visit | Attending: Hematology and Oncology | Admitting: Hematology and Oncology

## 2016-04-02 ENCOUNTER — Ambulatory Visit (HOSPITAL_BASED_OUTPATIENT_CLINIC_OR_DEPARTMENT_OTHER): Payer: PPO | Admitting: Hematology and Oncology

## 2016-04-02 VITALS — BP 125/54 | HR 106 | Temp 99.2°F | Resp 18 | Wt 179.8 lb

## 2016-04-02 DIAGNOSIS — I8002 Phlebitis and thrombophlebitis of superficial vessels of left lower extremity: Secondary | ICD-10-CM | POA: Diagnosis not present

## 2016-04-02 DIAGNOSIS — M7989 Other specified soft tissue disorders: Secondary | ICD-10-CM | POA: Insufficient documentation

## 2016-04-02 DIAGNOSIS — C9110 Chronic lymphocytic leukemia of B-cell type not having achieved remission: Secondary | ICD-10-CM | POA: Diagnosis present

## 2016-04-02 DIAGNOSIS — R748 Abnormal levels of other serum enzymes: Secondary | ICD-10-CM

## 2016-04-02 DIAGNOSIS — J40 Bronchitis, not specified as acute or chronic: Secondary | ICD-10-CM | POA: Diagnosis not present

## 2016-04-02 DIAGNOSIS — M79662 Pain in left lower leg: Secondary | ICD-10-CM | POA: Diagnosis not present

## 2016-04-02 DIAGNOSIS — R509 Fever, unspecified: Secondary | ICD-10-CM

## 2016-04-02 LAB — COMPREHENSIVE METABOLIC PANEL
ALT: 95 U/L — AB (ref 0–55)
AST: 63 U/L — ABNORMAL HIGH (ref 5–34)
Albumin: 3.3 g/dL — ABNORMAL LOW (ref 3.5–5.0)
Alkaline Phosphatase: 210 U/L — ABNORMAL HIGH (ref 40–150)
Anion Gap: 7 mEq/L (ref 3–11)
BUN: 12.4 mg/dL (ref 7.0–26.0)
CALCIUM: 9.8 mg/dL (ref 8.4–10.4)
CHLORIDE: 105 meq/L (ref 98–109)
CO2: 28 mEq/L (ref 22–29)
Creatinine: 0.8 mg/dL (ref 0.6–1.1)
EGFR: 76 mL/min/{1.73_m2} — ABNORMAL LOW (ref >90)
Glucose: 119 mg/dl (ref 70–140)
Potassium: 4 mEq/L (ref 3.5–5.1)
Sodium: 140 mEq/L (ref 136–145)
TOTAL PROTEIN: 6.5 g/dL (ref 6.4–8.3)
Total Bilirubin: 0.55 mg/dL (ref 0.20–1.20)

## 2016-04-02 LAB — URINALYSIS, MICROSCOPIC - CHCC
BILIRUBIN (URINE): NEGATIVE
BLOOD: NEGATIVE
Glucose: NEGATIVE mg/dL
KETONES: NEGATIVE mg/dL
LEUKOCYTE ESTERASE: NEGATIVE
NITRITE: NEGATIVE
Protein: NEGATIVE mg/dL
RBC / HPF: NEGATIVE (ref 0–2)
Specific Gravity, Urine: 1.015 (ref 1.003–1.035)
Urobilinogen, UR: 1 mg/dL (ref 0.2–1)
WBC, UA: NEGATIVE (ref 0–2)
pH: 6.5 (ref 4.6–8.0)

## 2016-04-02 LAB — CBC WITH DIFFERENTIAL/PLATELET
BASO%: 0.6 % (ref 0.0–2.0)
Basophils Absolute: 0.1 10*3/uL (ref 0.0–0.1)
EOS%: 0.7 % (ref 0.0–7.0)
Eosinophils Absolute: 0.1 10*3/uL (ref 0.0–0.5)
HEMATOCRIT: 39.2 % (ref 34.8–46.6)
HEMOGLOBIN: 12.8 g/dL (ref 11.6–15.9)
LYMPH#: 9.4 10*3/uL — AB (ref 0.9–3.3)
LYMPH%: 78.2 % — ABNORMAL HIGH (ref 14.0–49.7)
MCH: 29.1 pg (ref 25.1–34.0)
MCHC: 32.7 g/dL (ref 31.5–36.0)
MCV: 89.1 fL (ref 79.5–101.0)
MONO#: 0.4 10*3/uL (ref 0.1–0.9)
MONO%: 3.4 % (ref 0.0–14.0)
NEUT%: 17.1 % — ABNORMAL LOW (ref 38.4–76.8)
NEUTROS ABS: 2.1 10*3/uL (ref 1.5–6.5)
Platelets: 165 10*3/uL (ref 145–400)
RBC: 4.4 10*6/uL (ref 3.70–5.45)
RDW: 14 % (ref 11.2–14.5)
WBC: 12.1 10*3/uL — AB (ref 3.9–10.3)

## 2016-04-02 LAB — TECHNOLOGIST REVIEW

## 2016-04-02 LAB — LACTATE DEHYDROGENASE: LDH: 394 U/L — ABNORMAL HIGH (ref 125–245)

## 2016-04-02 NOTE — Progress Notes (Signed)
[facility] OFFICE PROGRESS NOTE  Patient Care Team: Lona Kettle, MD as PCP - General (Family Medicine) Delrae Rend, MD as Attending Physician (Internal Medicine)  SUMMARY OF ONCOLOGIC HISTORY:   CLL (chronic lymphocytic leukemia) ([place])   06/12/2012 Initial Diagnosis CLL (chronic lymphocytic leukemia)   07/20/2014 Pathology Results FISH was normal    INTERVAL HISTORY: Please see below for problem oriented charting. She is seen sooner than anticipated appointment due to recent acute febrile illness. The patient [place] to the beach several weeks ago and started to feel unwell with new onset of fever between 100-100.8 with associated night sweats.  She has reduced appetite and diarrhea and intermittent left leg swelling and pain. She has been afebrile for the last 48 hours. Her night sweats has improved and the diarrhea had resolved. Appetite is slowly improving. She denies recent trauma to her leg. She denies any cough, chest pain or shortness of breath. She denies recent sick contacts. No new lymphadenopathy. I received a phone call from her primary care doctor's office.  Blood work there dated 03/26/2016 show normal TSH, white blood cell count of 12.5, hemoglobin of 13.6 and platelet count of 151. Liver enzymes were abnormal with alkaline phosphatase of 172, AST of 45 and ALT of 79. Renal function tests within normal limits  REVIEW OF SYSTEMS:   Eyes: Denies blurriness of vision Ears, nose, mouth, throat, and face: Denies mucositis or sore throat Respiratory: Denies cough, dyspnea or wheezes Cardiovascular: Denies palpitation, chest discomfort Skin: Denies abnormal skin rashes Lymphatics: Denies new lymphadenopathy or easy bruising Neurological:Denies numbness, tingling or new weaknesses Behavioral/Psych: Mood is stable, no new changes  All other systems were reviewed with the patient and are negative.  I have reviewed the past medical history, past surgical  history, social history and family history with the patient and they are unchanged from previous note.  ALLERGIES:  has No Known Allergies.  MEDICATIONS:  Current Outpatient Prescriptions  Medication Sig Dispense Refill  . aspirin 81 MG tablet Take 160 mg by mouth daily.    . cholecalciferol (VITAMIN D) 1000 UNITS tablet Take 1,000 Units by mouth daily.    Marland Kitchen levothyroxine (SYNTHROID, LEVOTHROID) 88 MCG tablet Take 88 mcg by mouth daily.    . Multiple Vitamin (MULTIVITAMIN) tablet Take 1 tablet by mouth daily.    Marland Kitchen omeprazole (PRILOSEC) 20 MG capsule Take 20 mg by mouth every other day.      No current facility-administered medications for this visit.    PHYSICAL EXAMINATION: ECOG PERFORMANCE STATUS: 1 - Symptomatic but completely ambulatory  Filed Vitals:   04/02/16 1235  BP: 125/54  Pulse: 106  Temp: 99.2 F (37.3 C)  Resp: 18   Filed Weights   04/02/16 1235  Weight: 179 lb 12.8 oz (81.557 kg)    GENERAL:alert, no distress and comfortable SKIN: skin color, texture, turgor are normal, no rashes or significant lesions EYES: normal, Conjunctiva are pink and non-injected, sclera clear OROPHARYNX:no exudate, no erythema and lips, buccal mucosa, and tongue normal  NECK: supple, thyroid normal size, non-tender, without nodularity LYMPH:  no palpable lymphadenopathy in the cervical, axillary or inguinal LUNGS: clear to auscultation and percussion with normal breathing effort HEART: regular rate & rhythm and no murmurs With mild left lower lower extremity edema ABDOMEN:abdomen soft, non-tender and normal bowel sounds Musculoskeletal:no cyanosis of digits and no clubbing  NEURO: alert & oriented x 3 with fluent speech, no focal motor/sensory deficits  LABORATORY DATA:  I have reviewed the  data as listed    Component Value Date/Time   NA 140 04/02/2016 1222   NA 143 10/08/2011 1412   K 4.0 04/02/2016 1222   K 4.2 10/08/2011 1412   CL 104 12/11/2012 1408   CL 107 10/08/2011  1412   CO2 28 04/02/2016 1222   CO2 29 10/08/2011 1412   GLUCOSE 119 04/02/2016 1222   GLUCOSE 94 12/11/2012 1408   GLUCOSE 75 10/08/2011 1412   BUN 12.4 04/02/2016 1222   BUN 17 10/08/2011 1412   CREATININE 0.8 04/02/2016 1222   CREATININE 0.87 10/08/2011 1412   CALCIUM 9.8 04/02/2016 1222   CALCIUM 10.0 10/08/2011 1412   CALCIUM 10.3 06/28/2010 1345   PROT 6.5 04/02/2016 1222   PROT 6.1 10/08/2011 1412   ALBUMIN 3.3* 04/02/2016 1222   ALBUMIN 4.0 10/08/2011 1412   AST 63* 04/02/2016 1222   AST 35 10/08/2011 1412   ALT 95* 04/02/2016 1222   ALT 39* 10/08/2011 1412   ALKPHOS 210* 04/02/2016 1222   ALKPHOS 91 10/08/2011 1412   BILITOT 0.55 04/02/2016 1222   BILITOT 0.3 10/08/2011 1412    No results found for: SPEP, UPEP  Lab Results  Component Value Date   WBC 12.1* 04/02/2016   NEUTROABS 2.1 04/02/2016   HGB 12.8 04/02/2016   HCT 39.2 04/02/2016   MCV 89.1 04/02/2016   PLT 165 04/02/2016      Chemistry      Component Value Date/Time   NA 140 04/02/2016 1222   NA 143 10/08/2011 1412   K 4.0 04/02/2016 1222   K 4.2 10/08/2011 1412   CL 104 12/11/2012 1408   CL 107 10/08/2011 1412   CO2 28 04/02/2016 1222   CO2 29 10/08/2011 1412   BUN 12.4 04/02/2016 1222   BUN 17 10/08/2011 1412   CREATININE 0.8 04/02/2016 1222   CREATININE 0.87 10/08/2011 1412      Component Value Date/Time   CALCIUM 9.8 04/02/2016 1222   CALCIUM 10.0 10/08/2011 1412   CALCIUM 10.3 06/28/2010 1345   ALKPHOS 210* 04/02/2016 1222   ALKPHOS 91 10/08/2011 1412   AST 63* 04/02/2016 1222   AST 35 10/08/2011 1412   ALT 95* 04/02/2016 1222   ALT 39* 10/08/2011 1412   BILITOT 0.55 04/02/2016 1222   BILITOT 0.3 10/08/2011 1412       RADIOGRAPHIC STUDIES: I have personally reviewed the radiological images as listed and agreed with the findings in the report. Dg Chest 2 View  04/02/2016  CLINICAL DATA:  Chronic lymphocytic leukemia ; no current chest complaints. EXAM: CHEST  2 VIEW  COMPARISON:  CT scan of the chest dated July 21, 2013 FINDINGS: The lungs are adequately inflated. There is no focal infiltrate. The interstitial markings are coarse. The heart and pulmonary vascularity are normal. The mediastinum is normal in width. There is no pleural effusion. There is degenerative disc space narrowing at multiple thoracic levels without compression fracture. IMPRESSION: Mild chronic bronchitic changes. No evidence of pneumonia, CHF, lymphadenopathy, or other acute cardiopulmonary abnormality. Electronically Signed   By: David  Martinique M.D.   On: 04/02/2016 12:13     ASSESSMENT & PLAN:  CLL (chronic lymphocytic leukemia) Surprisingly, her white blood cell count is much reduced compared to baseline. She does not need treatment for this for now. We will observe.  Elevated liver enzymes She has elevated liver enzymes of unknown etiology. Her liver enzymes are a little worse compared to the blood work drawn at her primary care doctor's office  last week. I suspect it could be viral in nature. She is quite asymptomatic. We discussed the risk and benefit of ordering CT imaging. For now, she opted for conservative management by hydration, extra rest and to stay abstinent from alcohol intake. I plan to see her back again in [redacted] weeks along with repeat blood work. If her LFTs improve in the future, I might cancel those blood work. If not, we would also consider imaging study along with screening tests for viral hepatitis  Pain in left lower leg Stat US venous Doppler shows superficial thrombophlebitis. I recommended increase hydration and to switch 81 mg aspirin to 325 mg aspirin. I will reassess her leg again in 2 weeks  Fever Cause is unknown but could be due to acute viral infection, along with evidence of elevated liver enzymes It has resolved over the last 48 hours. Recommend observation only.   Orders Placed This Encounter  Procedures  . CBC with Differential/Platelet     Standing Status: Future     Number of Occurrences:      Standing Expiration Date: 05/07/2017  . Comprehensive metabolic panel    Standing Status: Future     Number of Occurrences:      Standing Expiration Date: 05/07/2017  . Hepatitis C antibody    Standing Status: Future     Number of Occurrences:      Standing Expiration Date: 05/07/2017  . Hepatitis B surface antibody    Standing Status: Future     Number of Occurrences:      Standing Expiration Date: 05/07/2017  . Hepatitis B core antibody, IgM    Standing Status: Future     Number of Occurrences:      Standing Expiration Date: 05/07/2017  . Hepatitis B surface antigen    Standing Status: Future     Number of Occurrences:      Standing Expiration Date: 05/07/2017  . Lactate dehydrogenase (LDH)    Standing Status: Future     Number of Occurrences:      Standing Expiration Date: 05/07/2017   All questions were answered. The patient knows to call the clinic with any problems, questions or concerns. No barriers to learning was detected. I spent 25 minutes counseling the patient face to face. The total time spent in the appointment was 30 minutes and more than 50% was on counseling and review of test results     [facility], [place], MD 04/02/2016 2:49 PM

## 2016-04-02 NOTE — Assessment & Plan Note (Signed)
She has elevated liver enzymes of unknown etiology. Her liver enzymes are a little worse compared to the blood work drawn at her primary care doctor's office last week. I suspect it could be viral in nature. She is quite asymptomatic. We discussed the risk and benefit of ordering CT imaging. For now, she opted for conservative management by hydration, extra rest and to stay abstinent from alcohol intake. I plan to see her back again in [redacted] weeks along with repeat blood work. If her LFTs improve in the future, I might cancel those blood work. If not, we would also consider imaging study along with screening tests for viral hepatitis

## 2016-04-02 NOTE — Progress Notes (Signed)
VASCULAR LAB PRELIMINARY  PRELIMINARY  PRELIMINARY  PRELIMINARY  Left lower extremity venous duplex has been completed.     Left:  No evidence of DVT. 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   No evidence of Baker's cyst.  Called Dr. Alvy Bimler with the results@2 :30 pm.  Janifer Adie, RVT, RDMS 04/02/2016, 2:33 PM

## 2016-04-02 NOTE — Assessment & Plan Note (Signed)
Stat US venous Doppler shows superficial thrombophlebitis. I recommended increase hydration and to switch 81 mg aspirin to 325 mg aspirin. I will reassess her leg again in 2 weeks

## 2016-04-02 NOTE — Assessment & Plan Note (Signed)
Surprisingly, her white blood cell count is much reduced compared to baseline. She does not need treatment for this for now. We will observe.

## 2016-04-02 NOTE — Assessment & Plan Note (Signed)
Cause is unknown but could be due to acute viral infection, along with evidence of elevated liver enzymes It has resolved over the last 48 hours. Recommend observation only.

## 2016-04-03 LAB — URINE CULTURE

## 2016-04-18 ENCOUNTER — Other Ambulatory Visit (HOSPITAL_BASED_OUTPATIENT_CLINIC_OR_DEPARTMENT_OTHER): Payer: PPO

## 2016-04-18 ENCOUNTER — Ambulatory Visit (HOSPITAL_BASED_OUTPATIENT_CLINIC_OR_DEPARTMENT_OTHER): Payer: PPO | Admitting: Hematology and Oncology

## 2016-04-18 ENCOUNTER — Encounter: Payer: Self-pay | Admitting: Hematology and Oncology

## 2016-04-18 DIAGNOSIS — R509 Fever, unspecified: Secondary | ICD-10-CM

## 2016-04-18 DIAGNOSIS — R748 Abnormal levels of other serum enzymes: Secondary | ICD-10-CM

## 2016-04-18 DIAGNOSIS — C9110 Chronic lymphocytic leukemia of B-cell type not having achieved remission: Secondary | ICD-10-CM

## 2016-04-18 DIAGNOSIS — M79662 Pain in left lower leg: Secondary | ICD-10-CM | POA: Diagnosis not present

## 2016-04-18 LAB — CBC WITH DIFFERENTIAL/PLATELET
BASO%: 0.7 % (ref 0.0–2.0)
Basophils Absolute: 0.1 10*3/uL (ref 0.0–0.1)
EOS ABS: 0.1 10*3/uL (ref 0.0–0.5)
EOS%: 0.7 % (ref 0.0–7.0)
HCT: 39.4 % (ref 34.8–46.6)
HGB: 13 g/dL (ref 11.6–15.9)
LYMPH#: 12.1 10*3/uL — AB (ref 0.9–3.3)
LYMPH%: 80 % — ABNORMAL HIGH (ref 14.0–49.7)
MCH: 29.3 pg (ref 25.1–34.0)
MCHC: 33 g/dL (ref 31.5–36.0)
MCV: 88.9 fL (ref 79.5–101.0)
MONO#: 0.6 10*3/uL (ref 0.1–0.9)
MONO%: 3.8 % (ref 0.0–14.0)
NEUT%: 14.8 % — ABNORMAL LOW (ref 38.4–76.8)
NEUTROS ABS: 2.3 10*3/uL (ref 1.5–6.5)
Platelets: 173 10*3/uL (ref 145–400)
RBC: 4.43 10*6/uL (ref 3.70–5.45)
RDW: 14.6 % — ABNORMAL HIGH (ref 11.2–14.5)
WBC: 15.1 10*3/uL — AB (ref 3.9–10.3)

## 2016-04-18 LAB — COMPREHENSIVE METABOLIC PANEL
ALBUMIN: 3.6 g/dL (ref 3.5–5.0)
ALK PHOS: 151 U/L — AB (ref 40–150)
ALT: 53 U/L (ref 0–55)
ANION GAP: 8 meq/L (ref 3–11)
AST: 42 U/L — ABNORMAL HIGH (ref 5–34)
BILIRUBIN TOTAL: 0.47 mg/dL (ref 0.20–1.20)
BUN: 12.8 mg/dL (ref 7.0–26.0)
CO2: 27 mEq/L (ref 22–29)
CREATININE: 0.8 mg/dL (ref 0.6–1.1)
Calcium: 10.1 mg/dL (ref 8.4–10.4)
Chloride: 108 mEq/L (ref 98–109)
EGFR: 81 mL/min/{1.73_m2} — ABNORMAL LOW (ref >90)
Glucose: 80 mg/dl (ref 70–140)
Potassium: 4.5 mEq/L (ref 3.5–5.1)
Sodium: 143 mEq/L (ref 136–145)
TOTAL PROTEIN: 6.9 g/dL (ref 6.4–8.3)

## 2016-04-18 LAB — LACTATE DEHYDROGENASE: LDH: 385 U/L — AB (ref 125–245)

## 2016-04-18 LAB — TECHNOLOGIST REVIEW

## 2016-04-18 NOTE — Progress Notes (Signed)
[place] OFFICE PROGRESS NOTE  Patient Care Team: Lona Kettle, MD as PCP - General (Family Medicine) Delrae Rend, MD as Attending Physician (Internal Medicine)  SUMMARY OF ONCOLOGIC HISTORY:   CLL (chronic lymphocytic leukemia) ([place])   06/12/2012 Initial Diagnosis    CLL (chronic lymphocytic leukemia)     07/20/2014 Pathology Results    FISH was normal      INTERVAL HISTORY: Please see below for problem oriented charting. She returns for further follow-up. Her fever had resolved. Her appetite is stable. Even though she had lost a little weight, overall she has improved since the last time I saw her. She denies abdominal pain.  REVIEW OF SYSTEMS:   Constitutional: Denies fevers, chills or abnormal weight loss Eyes: Denies blurriness of vision Ears, nose, mouth, throat, and face: Denies mucositis or sore throat Respiratory: Denies cough, dyspnea or wheezes Cardiovascular: Denies palpitation, chest discomfort or lower extremity swelling Gastrointestinal:  Denies nausea, heartburn or change in bowel habits Skin: Denies abnormal skin rashes Lymphatics: Denies new lymphadenopathy or easy bruising Neurological:Denies numbness, tingling or new weaknesses Behavioral/Psych: Mood is stable, no new changes  All other systems were reviewed with the patient and are negative.  I have reviewed the past medical history, past surgical history, social history and family history with the patient and they are unchanged from previous note.  ALLERGIES:  has No Known Allergies.  MEDICATIONS:  Current Outpatient Prescriptions  Medication Sig Dispense Refill  . aspirin 81 MG tablet Take 160 mg by mouth daily.    . cholecalciferol (VITAMIN D) 1000 UNITS tablet Take 1,000 Units by mouth daily.    Marland Kitchen levothyroxine (SYNTHROID, LEVOTHROID) 88 MCG tablet Take 88 mcg by mouth daily.    . Multiple Vitamin (MULTIVITAMIN) tablet Take 1 tablet by mouth daily.    Marland Kitchen omeprazole (PRILOSEC)  20 MG capsule Take 20 mg by mouth every other day.      No current facility-administered medications for this visit.     PHYSICAL EXAMINATION: ECOG PERFORMANCE STATUS: 0 - Asymptomatic  Vitals:   04/18/16 1011  BP: (!) 157/86  Pulse: 75  Resp: 18  Temp: 97.9 F (36.6 C)   Filed Weights   04/18/16 1011  Weight: 171 lb 11.2 oz (77.9 kg)    GENERAL:alert, no distress and comfortable SKIN: skin color, texture, turgor are normal, no rashes or significant lesions EYES: normal, Conjunctiva are pink and non-injected, sclera clear OROPHARYNX:no exudate, no erythema and lips, buccal mucosa, and tongue normal  NECK: supple, thyroid normal size, non-tender, without nodularity LYMPH:  no palpable lymphadenopathy in the cervical, axillary or inguinal LUNGS: clear to auscultation and percussion with normal breathing effort HEART: regular rate & rhythm and no murmurs and no lower extremity edema ABDOMEN:abdomen soft, non-tender and normal bowel sounds Musculoskeletal:no cyanosis of digits and no clubbing  NEURO: alert & oriented x 3 with fluent speech, no focal motor/sensory deficits  LABORATORY DATA:  I have reviewed the data as listed    Component Value Date/Time   NA 143 04/18/2016 0948   K 4.5 04/18/2016 0948   CL 104 12/11/2012 1408   CO2 27 04/18/2016 0948   GLUCOSE 80 04/18/2016 0948   GLUCOSE 94 12/11/2012 1408   BUN 12.8 04/18/2016 0948   CREATININE 0.8 04/18/2016 0948   CALCIUM 10.1 04/18/2016 0948   PROT 6.9 04/18/2016 0948   ALBUMIN 3.6 04/18/2016 0948   AST 42 (H) 04/18/2016 0948   ALT 53 04/18/2016 0948   ALKPHOS 151 (  H) 04/18/2016 0948   BILITOT 0.47 04/18/2016 0948    No results found for: SPEP, UPEP  Lab Results  Component Value Date   WBC 15.1 (H) 04/18/2016   NEUTROABS 2.3 04/18/2016   HGB 13.0 04/18/2016   HCT 39.4 04/18/2016   MCV 88.9 04/18/2016   PLT 173 04/18/2016      Chemistry      Component Value Date/Time   NA 143 04/18/2016 0948   K  4.5 04/18/2016 0948   CL 104 12/11/2012 1408   CO2 27 04/18/2016 0948   BUN 12.8 04/18/2016 0948   CREATININE 0.8 04/18/2016 0948      Component Value Date/Time   CALCIUM 10.1 04/18/2016 0948   ALKPHOS 151 (H) 04/18/2016 0948   AST 42 (H) 04/18/2016 0948   ALT 53 04/18/2016 0948   BILITOT 0.47 04/18/2016 0948     ASSESSMENT & PLAN:   CLL (chronic lymphocytic leukemia) Surprisingly, her white blood cell count is much reduced compared to baseline. She does not need treatment for this for now. We will observe.  Elevated liver enzymes She has elevated liver enzymes of unknown etiology. Repeat liver enzymes had improved with conservative management and surveillance. I recommend defer CT scan and I will see her back again in 3 months I recommend she avoid alcohol intake I have drawn hepatitis panel, pending  Fever Cause is unknown but could be due to acute viral infection, along with evidence of elevated liver enzymes It has resolved over the last 48 hours. Recommend observation only.    All questions were answered. The patient knows to call the clinic with any problems, questions or concerns. No barriers to learning was detected. I spent 15 minutes counseling the patient face to face. The total time spent in the appointment was 20 minutes and more than 50% was on counseling and review of test results     [facility], [place], MD 04/19/2016 3:17 PM

## 2016-04-19 LAB — HEPATITIS B SURFACE ANTIBODY,QUALITATIVE: Hep B Surface Ab, Qual: NONREACTIVE

## 2016-04-19 LAB — HEPATITIS B SURFACE ANTIGEN: HEP B S AG: NEGATIVE

## 2016-04-19 LAB — HEPATITIS C ANTIBODY: Hep C Virus Ab: 0.4 s/co ratio (ref 0.0–0.9)

## 2016-04-19 LAB — HEPATITIS B CORE ANTIBODY, IGM: HEP B C IGM: NEGATIVE

## 2016-04-19 NOTE — Assessment & Plan Note (Signed)
Cause is unknown but could be due to acute viral infection, along with evidence of elevated liver enzymes It has resolved over the last 48 hours. Recommend observation only.

## 2016-04-19 NOTE — Assessment & Plan Note (Signed)
She has elevated liver enzymes of unknown etiology. Repeat liver enzymes had improved with conservative management and surveillance. I recommend defer CT scan and I will see her back again in 3 months I recommend she avoid alcohol intake I have drawn hepatitis panel, pending

## 2016-04-19 NOTE — Assessment & Plan Note (Signed)
Surprisingly, her white blood cell count is much reduced compared to baseline. She does not need treatment for this for now. We will observe.

## 2016-07-13 DIAGNOSIS — Z23 Encounter for immunization: Secondary | ICD-10-CM | POA: Diagnosis not present

## 2016-07-17 ENCOUNTER — Other Ambulatory Visit: Payer: Self-pay | Admitting: Hematology and Oncology

## 2016-07-17 DIAGNOSIS — Z1231 Encounter for screening mammogram for malignant neoplasm of breast: Secondary | ICD-10-CM

## 2016-07-19 ENCOUNTER — Other Ambulatory Visit: Payer: Self-pay | Admitting: Hematology and Oncology

## 2016-07-19 ENCOUNTER — Encounter: Payer: Self-pay | Admitting: Hematology and Oncology

## 2016-07-19 ENCOUNTER — Ambulatory Visit (HOSPITAL_BASED_OUTPATIENT_CLINIC_OR_DEPARTMENT_OTHER): Payer: PPO | Admitting: Hematology and Oncology

## 2016-07-19 ENCOUNTER — Telehealth: Payer: Self-pay | Admitting: Hematology and Oncology

## 2016-07-19 ENCOUNTER — Other Ambulatory Visit (HOSPITAL_BASED_OUTPATIENT_CLINIC_OR_DEPARTMENT_OTHER): Payer: PPO

## 2016-07-19 VITALS — BP 128/64 | HR 77 | Temp 98.0°F | Resp 18 | Ht 63.0 in | Wt 168.6 lb

## 2016-07-19 DIAGNOSIS — R748 Abnormal levels of other serum enzymes: Secondary | ICD-10-CM

## 2016-07-19 DIAGNOSIS — C9110 Chronic lymphocytic leukemia of B-cell type not having achieved remission: Secondary | ICD-10-CM | POA: Diagnosis not present

## 2016-07-19 LAB — CBC WITH DIFFERENTIAL/PLATELET
BASO%: 0.1 % (ref 0.0–2.0)
BASOS ABS: 0.1 10*3/uL (ref 0.0–0.1)
EOS%: 0.5 % (ref 0.0–7.0)
Eosinophils Absolute: 0.2 10*3/uL (ref 0.0–0.5)
HCT: 44.3 % (ref 34.8–46.6)
HGB: 14.4 g/dL (ref 11.6–15.9)
LYMPH%: 85.4 % — ABNORMAL HIGH (ref 14.0–49.7)
MCH: 29 pg (ref 25.1–34.0)
MCHC: 32.5 g/dL (ref 31.5–36.0)
MCV: 89.3 fL (ref 79.5–101.0)
MONO#: 0.5 10*3/uL (ref 0.1–0.9)
MONO%: 1.5 % (ref 0.0–14.0)
NEUT#: 4.4 10*3/uL (ref 1.5–6.5)
NEUT%: 12.5 % — AB (ref 38.4–76.8)
Platelets: 207 10*3/uL (ref 145–400)
RBC: 4.96 10*6/uL (ref 3.70–5.45)
RDW: 14.2 % (ref 11.2–14.5)
WBC: 34.9 10*3/uL — ABNORMAL HIGH (ref 3.9–10.3)
lymph#: 29.8 10*3/uL — ABNORMAL HIGH (ref 0.9–3.3)

## 2016-07-19 LAB — COMPREHENSIVE METABOLIC PANEL
ALT: 52 U/L (ref 0–55)
AST: 28 U/L (ref 5–34)
Albumin: 3.8 g/dL (ref 3.5–5.0)
Alkaline Phosphatase: 114 U/L (ref 40–150)
Anion Gap: 6 mEq/L (ref 3–11)
BUN: 11.5 mg/dL (ref 7.0–26.0)
CHLORIDE: 109 meq/L (ref 98–109)
CO2: 28 meq/L (ref 22–29)
Calcium: 10.6 mg/dL — ABNORMAL HIGH (ref 8.4–10.4)
Creatinine: 0.8 mg/dL (ref 0.6–1.1)
EGFR: 82 mL/min/{1.73_m2} — ABNORMAL LOW (ref >90)
GLUCOSE: 115 mg/dL (ref 70–140)
POTASSIUM: 4.6 meq/L (ref 3.5–5.1)
SODIUM: 143 meq/L (ref 136–145)
Total Bilirubin: 0.32 mg/dL (ref 0.20–1.20)
Total Protein: 6.9 g/dL (ref 6.4–8.3)

## 2016-07-19 LAB — LACTATE DEHYDROGENASE: LDH: 165 U/L (ref 125–245)

## 2016-07-19 LAB — TECHNOLOGIST REVIEW

## 2016-07-19 NOTE — Assessment & Plan Note (Signed)
She had extensive workup in the past. She is not symptomatic. Recommend observation only This could be related to mild dehydration

## 2016-07-19 NOTE — Telephone Encounter (Signed)
Appointments scheduled per 10/26 LOS. Patient given AVS report and calendar of future scheduled appointments. °

## 2016-07-19 NOTE — Assessment & Plan Note (Signed)
Her CBC is back to her usual baseline as compared to last year She is not symptomatic She does not need treatment for this for now. We will observe.

## 2016-07-19 NOTE — Assessment & Plan Note (Signed)
This has resolved. Observation only

## 2016-07-19 NOTE — Progress Notes (Signed)
[place] OFFICE PROGRESS NOTE  Patient Care Team: Lawerance Cruel, MD as PCP - General (Family Medicine) Delrae Rend, MD as Attending Physician (Internal Medicine)  SUMMARY OF ONCOLOGIC HISTORY:   CLL (chronic lymphocytic leukemia) ([place])   06/12/2012 Initial Diagnosis    CLL (chronic lymphocytic leukemia)      07/20/2014 Pathology Results    FISH was normal       INTERVAL HISTORY: Please see below for problem oriented charting. She returns for further follow-up. She denies recent illness. She is up-to-date with vaccination. No new lymphadenopathy. Denies recent infection. No anorexia or weight loss  REVIEW OF SYSTEMS:   Constitutional: Denies fevers, chills or abnormal weight loss Eyes: Denies blurriness of vision Ears, nose, mouth, throat, and face: Denies mucositis or sore throat Respiratory: Denies cough, dyspnea or wheezes Cardiovascular: Denies palpitation, chest discomfort or lower extremity swelling Gastrointestinal:  Denies nausea, heartburn or change in bowel habits Skin: Denies abnormal skin rashes Lymphatics: Denies new lymphadenopathy or easy bruising Neurological:Denies numbness, tingling or new weaknesses Behavioral/Psych: Mood is stable, no new changes  All other systems were reviewed with the patient and are negative.  I have reviewed the past medical history, past surgical history, social history and family history with the patient and they are unchanged from previous note.  ALLERGIES:  has No Known Allergies.  MEDICATIONS:  Current Outpatient Prescriptions  Medication Sig Dispense Refill  . aspirin 325 MG tablet Take 325 mg by mouth daily.    Marland Kitchen BIOTIN PO Take by mouth daily.    . cholecalciferol (VITAMIN D) 1000 UNITS tablet Take 1,000 Units by mouth daily.    Marland Kitchen levothyroxine (SYNTHROID, LEVOTHROID) 88 MCG tablet Take 88 mcg by mouth daily.    . Multiple Vitamin (MULTIVITAMIN) tablet Take 1 tablet by mouth daily.    Marland Kitchen  omeprazole (PRILOSEC) 20 MG capsule Take 20 mg by mouth daily.      No current facility-administered medications for this visit.     PHYSICAL EXAMINATION: ECOG PERFORMANCE STATUS: 0 - Asymptomatic  Vitals:   07/19/16 0928  BP: 128/64  Pulse: 77  Resp: 18  Temp: 98 F (36.7 C)   Filed Weights   07/19/16 0928  Weight: 168 lb 9.6 oz (76.5 kg)    GENERAL:alert, no distress and comfortable SKIN: skin color, texture, turgor are normal, no rashes or significant lesions EYES: normal, Conjunctiva are pink and non-injected, sclera clear OROPHARYNX:no exudate, no erythema and lips, buccal mucosa, and tongue normal  NECK: supple, thyroid normal size, non-tender, without nodularity LYMPH:  no palpable lymphadenopathy in the cervical, axillary or inguinal LUNGS: clear to auscultation and percussion with normal breathing effort HEART: regular rate & rhythm and no murmurs and no lower extremity edema ABDOMEN:abdomen soft, non-tender and normal bowel sounds Musculoskeletal:no cyanosis of digits and no clubbing  NEURO: alert & oriented x 3 with fluent speech, no focal motor/sensory deficits  LABORATORY DATA:  I have reviewed the data as listed    Component Value Date/Time   NA 143 07/19/2016 0910   K 4.6 07/19/2016 0910   CL 104 12/11/2012 1408   CO2 28 07/19/2016 0910   GLUCOSE 115 07/19/2016 0910   GLUCOSE 94 12/11/2012 1408   BUN 11.5 07/19/2016 0910   CREATININE 0.8 07/19/2016 0910   CALCIUM 10.6 (H) 07/19/2016 0910   PROT 6.9 07/19/2016 0910   ALBUMIN 3.8 07/19/2016 0910   AST 28 07/19/2016 0910   ALT 52 07/19/2016 0910   ALKPHOS 114 07/19/2016  0910   BILITOT 0.32 07/19/2016 0910    No results found for: SPEP, UPEP  Lab Results  Component Value Date   WBC 34.9 (H) 07/19/2016   NEUTROABS 4.4 07/19/2016   HGB 14.4 07/19/2016   HCT 44.3 07/19/2016   MCV 89.3 07/19/2016   PLT 207 07/19/2016      Chemistry      Component Value Date/Time   NA 143 07/19/2016 0910   K  4.6 07/19/2016 0910   CL 104 12/11/2012 1408   CO2 28 07/19/2016 0910   BUN 11.5 07/19/2016 0910   CREATININE 0.8 07/19/2016 0910      Component Value Date/Time   CALCIUM 10.6 (H) 07/19/2016 0910   ALKPHOS 114 07/19/2016 0910   AST 28 07/19/2016 0910   ALT 52 07/19/2016 0910   BILITOT 0.32 07/19/2016 0910      ASSESSMENT & PLAN:  CLL (chronic lymphocytic leukemia) Her CBC is back to her usual baseline as compared to last year She is not symptomatic She does not need treatment for this for now. We will observe.  Elevated liver enzymes This has resolved. Observation only  Hypercalcemia She had extensive workup in the past. She is not symptomatic. Recommend observation only This could be related to mild dehydration   Orders Placed This Encounter  Procedures  . CBC with Differential/Platelet    Standing Status:   Future    Standing Expiration Date:   08/23/2017   All questions were answered. The patient knows to call the clinic with any problems, questions or concerns. No barriers to learning was detected. I spent 15 minutes counseling the patient face to face. The total time spent in the appointment was 20 minutes and more than 50% was on counseling and review of test results     [name], MD 07/19/2016 2:48 PM

## 2016-08-22 ENCOUNTER — Ambulatory Visit
Admission: RE | Admit: 2016-08-22 | Discharge: 2016-08-22 | Disposition: A | Discharge status: Home / Self Care | Payer: PPO | Source: Ambulatory Visit | Attending: Hematology and Oncology | Admitting: Hematology and Oncology

## 2016-08-22 DIAGNOSIS — Z1231 Encounter for screening mammogram for malignant neoplasm of breast: Secondary | ICD-10-CM | POA: Diagnosis not present

## 2016-08-23 ENCOUNTER — Ambulatory Visit: Payer: PPO

## 2016-11-06 DIAGNOSIS — M8580 Other specified disorders of bone density and structure, unspecified site: Secondary | ICD-10-CM | POA: Diagnosis not present

## 2016-11-06 DIAGNOSIS — E039 Hypothyroidism, unspecified: Secondary | ICD-10-CM | POA: Diagnosis not present

## 2016-11-06 DIAGNOSIS — E210 Primary hyperparathyroidism: Secondary | ICD-10-CM | POA: Diagnosis not present

## 2016-11-08 DIAGNOSIS — M8580 Other specified disorders of bone density and structure, unspecified site: Secondary | ICD-10-CM | POA: Diagnosis not present

## 2016-11-08 DIAGNOSIS — E210 Primary hyperparathyroidism: Secondary | ICD-10-CM | POA: Diagnosis not present

## 2016-11-08 DIAGNOSIS — E039 Hypothyroidism, unspecified: Secondary | ICD-10-CM | POA: Diagnosis not present

## 2017-01-07 DIAGNOSIS — E038 Other specified hypothyroidism: Secondary | ICD-10-CM | POA: Diagnosis not present

## 2017-03-07 DIAGNOSIS — M65312 Trigger thumb, left thumb: Secondary | ICD-10-CM | POA: Diagnosis not present

## 2017-04-13 ENCOUNTER — Telehealth: Payer: Self-pay

## 2017-04-13 NOTE — Telephone Encounter (Signed)
Called and left a message with a new appt as dr Alvy Bimler is out of the office 10/25   Kaitlin Barnes

## 2017-07-18 ENCOUNTER — Ambulatory Visit: Payer: PPO | Admitting: Hematology and Oncology

## 2017-07-18 ENCOUNTER — Other Ambulatory Visit: Payer: PPO

## 2017-07-22 ENCOUNTER — Other Ambulatory Visit (HOSPITAL_BASED_OUTPATIENT_CLINIC_OR_DEPARTMENT_OTHER): Payer: PPO

## 2017-07-22 ENCOUNTER — Ambulatory Visit (HOSPITAL_BASED_OUTPATIENT_CLINIC_OR_DEPARTMENT_OTHER): Payer: PPO | Admitting: Hematology and Oncology

## 2017-07-22 ENCOUNTER — Telehealth: Payer: Self-pay | Admitting: Hematology and Oncology

## 2017-07-22 DIAGNOSIS — C9110 Chronic lymphocytic leukemia of B-cell type not having achieved remission: Secondary | ICD-10-CM

## 2017-07-22 LAB — CBC WITH DIFFERENTIAL/PLATELET
BASO%: 0.3 % (ref 0.0–2.0)
BASOS ABS: 0.1 10*3/uL (ref 0.0–0.1)
EOS ABS: 0.2 10*3/uL (ref 0.0–0.5)
EOS%: 0.5 % (ref 0.0–7.0)
HEMATOCRIT: 42.3 % (ref 34.8–46.6)
HEMOGLOBIN: 13.9 g/dL (ref 11.6–15.9)
LYMPH#: 31.4 10*3/uL — AB (ref 0.9–3.3)
LYMPH%: 84.1 % — ABNORMAL HIGH (ref 14.0–49.7)
MCH: 30.4 pg (ref 25.1–34.0)
MCHC: 33 g/dL (ref 31.5–36.0)
MCV: 92.4 fL (ref 79.5–101.0)
MONO#: 0.7 10*3/uL (ref 0.1–0.9)
MONO%: 1.8 % (ref 0.0–14.0)
NEUT#: 5 10*3/uL (ref 1.5–6.5)
NEUT%: 13.3 % — AB (ref 38.4–76.8)
PLATELETS: 217 10*3/uL (ref 145–400)
RBC: 4.58 10*6/uL (ref 3.70–5.45)
RDW: 13.5 % (ref 11.2–14.5)
WBC: 37.4 10*3/uL — ABNORMAL HIGH (ref 3.9–10.3)

## 2017-07-22 LAB — TECHNOLOGIST REVIEW

## 2017-07-22 NOTE — Telephone Encounter (Signed)
Gave patient avs report and appointments for October 2019.  °

## 2017-07-23 ENCOUNTER — Encounter: Payer: Self-pay | Admitting: Hematology and Oncology

## 2017-07-23 NOTE — Assessment & Plan Note (Signed)
Her CBC is back to her usual baseline as compared to last year, mildly increased without associated anemia or thrombocytopenia She is not symptomatic She does not need treatment for this for now. We will observe.

## 2017-07-23 NOTE — Progress Notes (Signed)
[place] OFFICE PROGRESS NOTE  Patient Care Team: Lawerance Cruel, MD as PCP - General (Family Medicine) Delrae Rend, MD as Attending Physician (Internal Medicine)  SUMMARY OF ONCOLOGIC HISTORY:   CLL (chronic lymphocytic leukemia) ([place])   06/12/2012 Initial Diagnosis    CLL (chronic lymphocytic leukemia)      07/20/2014 Pathology Results    FISH was normal       INTERVAL HISTORY: Please see below for problem oriented charting. She returns for further follow-up Denies recent infection No new lymphadenopathy Her energy level is fair Denies abnormal night sweats, anorexia or weight loss  REVIEW OF SYSTEMS:   Constitutional: Denies fevers, chills or abnormal weight loss Eyes: Denies blurriness of vision Ears, nose, mouth, throat, and face: Denies mucositis or sore throat Respiratory: Denies cough, dyspnea or wheezes Cardiovascular: Denies palpitation, chest discomfort or lower extremity swelling Gastrointestinal:  Denies nausea, heartburn or change in bowel habits Skin: Denies abnormal skin rashes Lymphatics: Denies new lymphadenopathy or easy bruising Neurological:Denies numbness, tingling or new weaknesses Behavioral/Psych: Mood is stable, no new changes  All other systems were reviewed with the patient and are negative.  I have reviewed the past medical history, past surgical history, social history and family history with the patient and they are unchanged from previous note.  ALLERGIES:  has No Known Allergies.  MEDICATIONS:  Current Outpatient Prescriptions  Medication Sig Dispense Refill  . aspirin 325 MG tablet Take 325 mg by mouth daily.    Marland Kitchen BIOTIN PO Take by mouth daily.    . cholecalciferol (VITAMIN D) 1000 UNITS tablet Take 1,000 Units by mouth daily.    Marland Kitchen levothyroxine (SYNTHROID, LEVOTHROID) 88 MCG tablet Take 75 mcg by mouth daily.    . Multiple Vitamin (MULTIVITAMIN) tablet Take 1 tablet by mouth daily.    Marland Kitchen omeprazole (PRILOSEC)  20 MG capsule Take 20 mg by mouth daily.      No current facility-administered medications for this visit.     PHYSICAL EXAMINATION: ECOG PERFORMANCE STATUS: 1 - Symptomatic but completely ambulatory  Vitals:   07/22/17 1002  BP: (!) 142/76  Pulse: 91  Resp: 20  Temp: 98.2 F (36.8 C)  SpO2: 98%   Filed Weights   07/22/17 1002  Weight: 175 lb 11.2 oz (79.7 kg)    GENERAL:alert, no distress and comfortable SKIN: skin color, texture, turgor are normal, no rashes or significant lesions EYES: normal, Conjunctiva are pink and non-injected, sclera clear OROPHARYNX:no exudate, no erythema and lips, buccal mucosa, and tongue normal  NECK: Well-healed surgical scar LYMPH:  no palpable lymphadenopathy in the cervical, axillary or inguinal LUNGS: clear to auscultation and percussion with normal breathing effort HEART: regular rate & rhythm and no murmurs and no lower extremity edema ABDOMEN:abdomen soft, non-tender and normal bowel sounds Musculoskeletal:no cyanosis of digits and no clubbing  NEURO: alert & oriented x 3 with fluent speech, no focal motor/sensory deficits  LABORATORY DATA:  I have reviewed the data as listed    Component Value Date/Time   NA 143 07/19/2016 0910   K 4.6 07/19/2016 0910   CL 104 12/11/2012 1408   CO2 28 07/19/2016 0910   GLUCOSE 115 07/19/2016 0910   GLUCOSE 94 12/11/2012 1408   BUN 11.5 07/19/2016 0910   CREATININE 0.8 07/19/2016 0910   CALCIUM 10.6 (H) 07/19/2016 0910   PROT 6.9 07/19/2016 0910   ALBUMIN 3.8 07/19/2016 0910   AST 28 07/19/2016 0910   ALT 52 07/19/2016 0910   ALKPHOS  114 07/19/2016 0910   BILITOT 0.32 07/19/2016 0910    No results found for: SPEP, UPEP  Lab Results  Component Value Date   WBC 37.4 (H) 07/22/2017   NEUTROABS 5.0 07/22/2017   HGB 13.9 07/22/2017   HCT 42.3 07/22/2017   MCV 92.4 07/22/2017   PLT 217 07/22/2017      Chemistry      Component Value Date/Time   NA 143 07/19/2016 0910   K 4.6  07/19/2016 0910   CL 104 12/11/2012 1408   CO2 28 07/19/2016 0910   BUN 11.5 07/19/2016 0910   CREATININE 0.8 07/19/2016 0910      Component Value Date/Time   CALCIUM 10.6 (H) 07/19/2016 0910   ALKPHOS 114 07/19/2016 0910   AST 28 07/19/2016 0910   ALT 52 07/19/2016 0910   BILITOT 0.32 07/19/2016 0910       ASSESSMENT & PLAN:  CLL (chronic lymphocytic leukemia) Her CBC is back to her usual baseline as compared to last year, mildly increased without associated anemia or thrombocytopenia She is not symptomatic She does not need treatment for this for now. We will observe.   No orders of the defined types were placed in this encounter.  All questions were answered. The patient knows to call the clinic with any problems, questions or concerns. No barriers to learning was detected. I spent 10 minutes counseling the patient face to face. The total time spent in the appointment was 15 minutes and more than 50% was on counseling and review of test results     [name], MD 07/23/2017 7:35 AM

## 2017-07-30 ENCOUNTER — Other Ambulatory Visit: Payer: Self-pay | Admitting: Hematology and Oncology

## 2017-07-30 DIAGNOSIS — Z1231 Encounter for screening mammogram for malignant neoplasm of breast: Secondary | ICD-10-CM

## 2017-08-29 ENCOUNTER — Ambulatory Visit
Admission: RE | Admit: 2017-08-29 | Discharge: 2017-08-29 | Disposition: A | Discharge status: Home / Self Care | Payer: PPO | Source: Ambulatory Visit | Attending: Hematology and Oncology | Admitting: Hematology and Oncology

## 2017-08-29 DIAGNOSIS — Z1231 Encounter for screening mammogram for malignant neoplasm of breast: Secondary | ICD-10-CM

## 2017-11-08 DIAGNOSIS — E039 Hypothyroidism, unspecified: Secondary | ICD-10-CM | POA: Diagnosis not present

## 2017-11-08 DIAGNOSIS — E210 Primary hyperparathyroidism: Secondary | ICD-10-CM | POA: Diagnosis not present

## 2017-11-08 DIAGNOSIS — M810 Age-related osteoporosis without current pathological fracture: Secondary | ICD-10-CM | POA: Diagnosis not present

## 2017-11-12 DIAGNOSIS — M8580 Other specified disorders of bone density and structure, unspecified site: Secondary | ICD-10-CM | POA: Diagnosis not present

## 2017-11-12 DIAGNOSIS — H9190 Unspecified hearing loss, unspecified ear: Secondary | ICD-10-CM | POA: Diagnosis not present

## 2017-11-12 DIAGNOSIS — E210 Primary hyperparathyroidism: Secondary | ICD-10-CM | POA: Diagnosis not present

## 2017-11-12 DIAGNOSIS — E039 Hypothyroidism, unspecified: Secondary | ICD-10-CM | POA: Diagnosis not present

## 2017-11-26 DIAGNOSIS — H903 Sensorineural hearing loss, bilateral: Secondary | ICD-10-CM | POA: Diagnosis not present

## 2017-12-17 DIAGNOSIS — H903 Sensorineural hearing loss, bilateral: Secondary | ICD-10-CM | POA: Diagnosis not present

## 2018-01-02 DIAGNOSIS — Z1211 Encounter for screening for malignant neoplasm of colon: Secondary | ICD-10-CM | POA: Diagnosis not present

## 2018-01-03 DIAGNOSIS — E210 Primary hyperparathyroidism: Secondary | ICD-10-CM | POA: Diagnosis not present

## 2018-01-03 DIAGNOSIS — C9110 Chronic lymphocytic leukemia of B-cell type not having achieved remission: Secondary | ICD-10-CM | POA: Diagnosis not present

## 2018-01-03 DIAGNOSIS — Z0000 Encounter for general adult medical examination without abnormal findings: Secondary | ICD-10-CM | POA: Diagnosis not present

## 2018-01-03 DIAGNOSIS — Z13220 Encounter for screening for lipoid disorders: Secondary | ICD-10-CM | POA: Diagnosis not present

## 2018-01-03 DIAGNOSIS — E039 Hypothyroidism, unspecified: Secondary | ICD-10-CM | POA: Diagnosis not present

## 2018-01-03 DIAGNOSIS — Z23 Encounter for immunization: Secondary | ICD-10-CM | POA: Diagnosis not present

## 2018-01-09 DIAGNOSIS — M8588 Other specified disorders of bone density and structure, other site: Secondary | ICD-10-CM | POA: Diagnosis not present

## 2018-02-11 IMAGING — MG 2D DIGITAL SCREENING BILATERAL MAMMOGRAM WITH CAD AND ADJUNCT TO
9 of 12 series · 9 of 28 positions shown · non-contrast
Comparison: Previous exam(s).

CLINICAL DATA: Screening.

EXAM:
2D DIGITAL SCREENING BILATERAL MAMMOGRAM WITH CAD AND ADJUNCT TOMO

[R MLO]
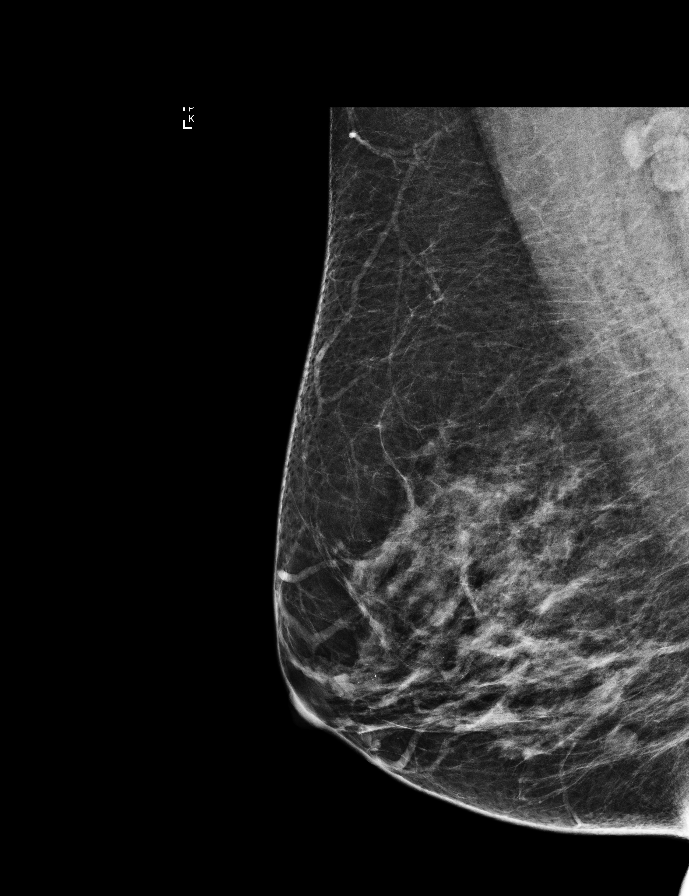

[L CC]
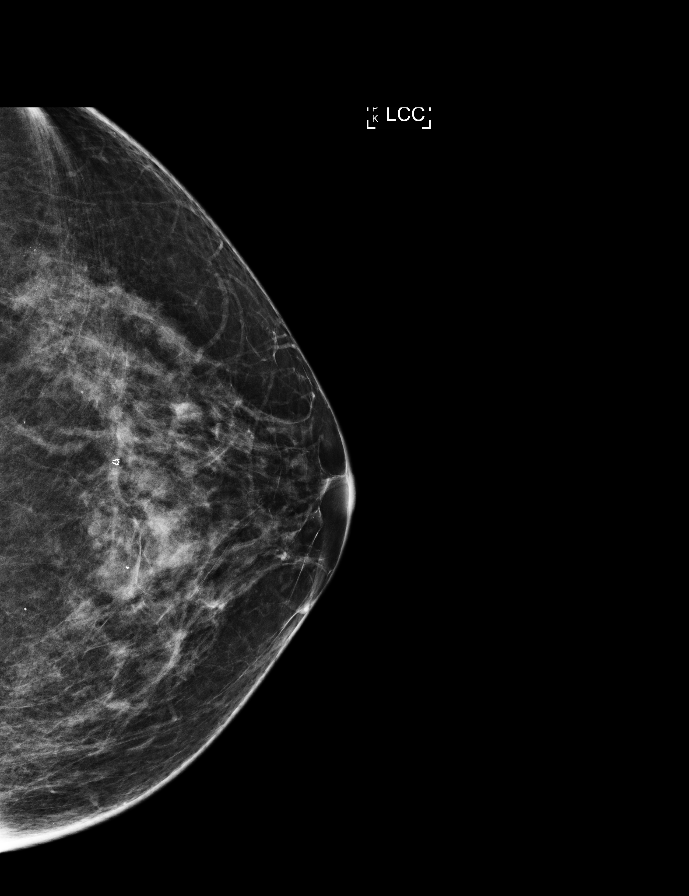

[L MLO]
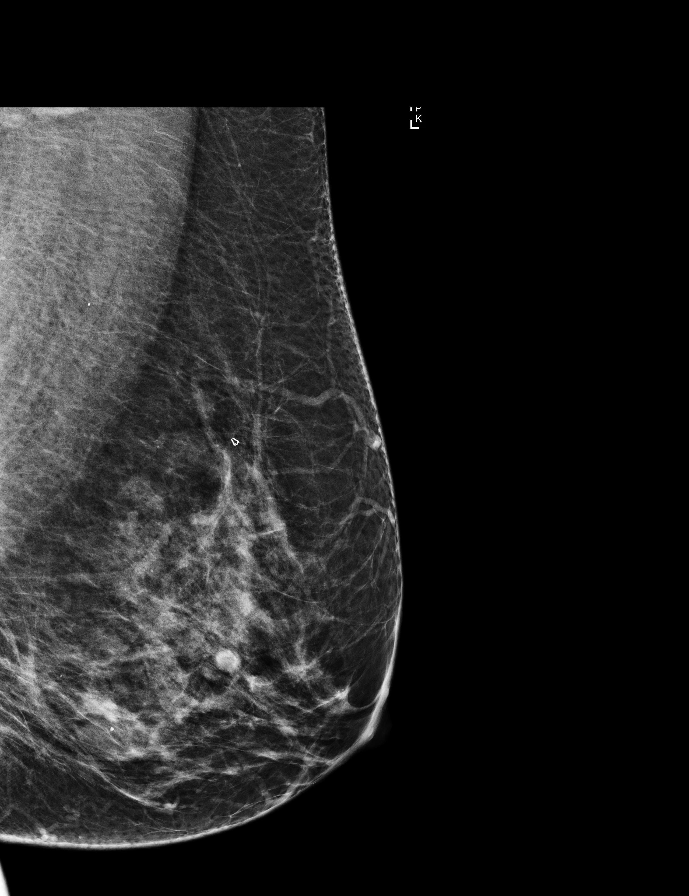

[L MLO synth-2D]
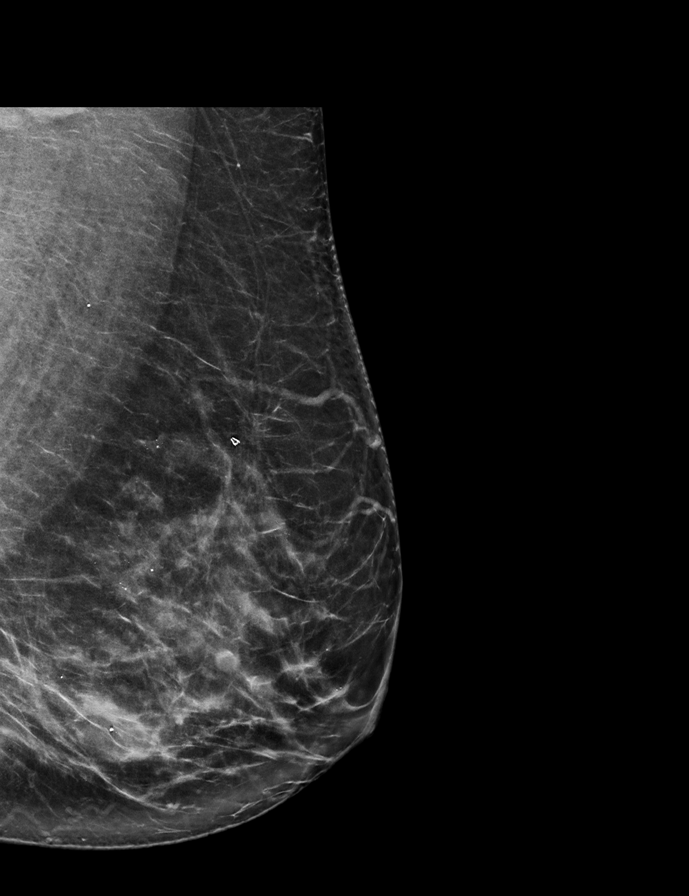

[R CC synth-2D]
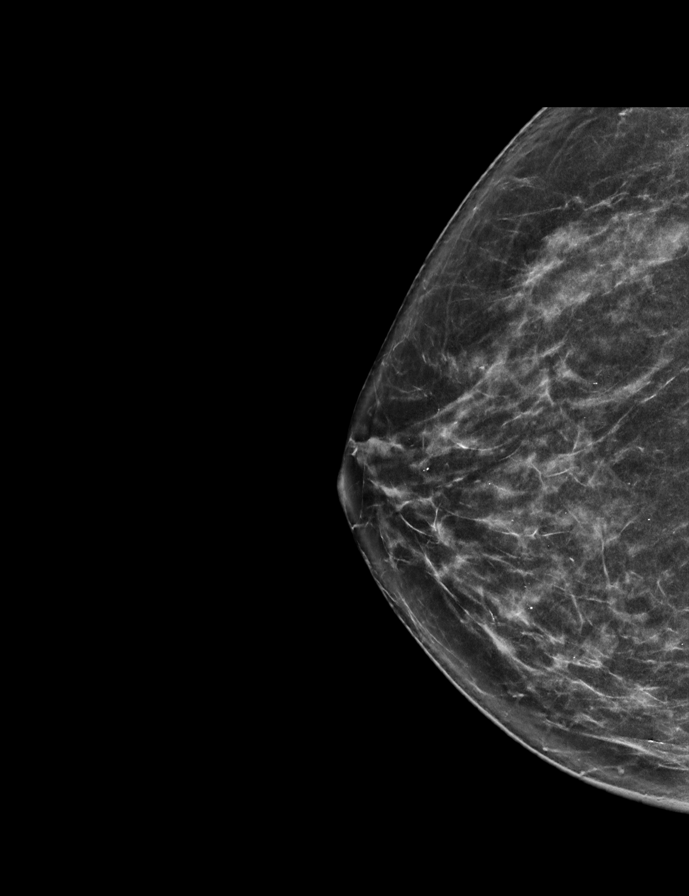

[L CC synth-2D]
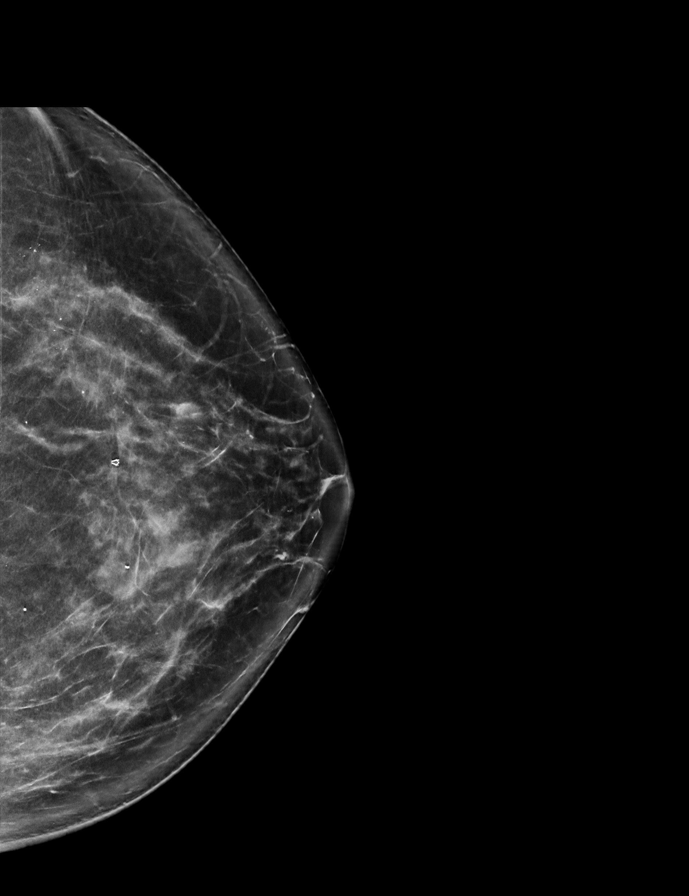

[R MLO synth-2D]
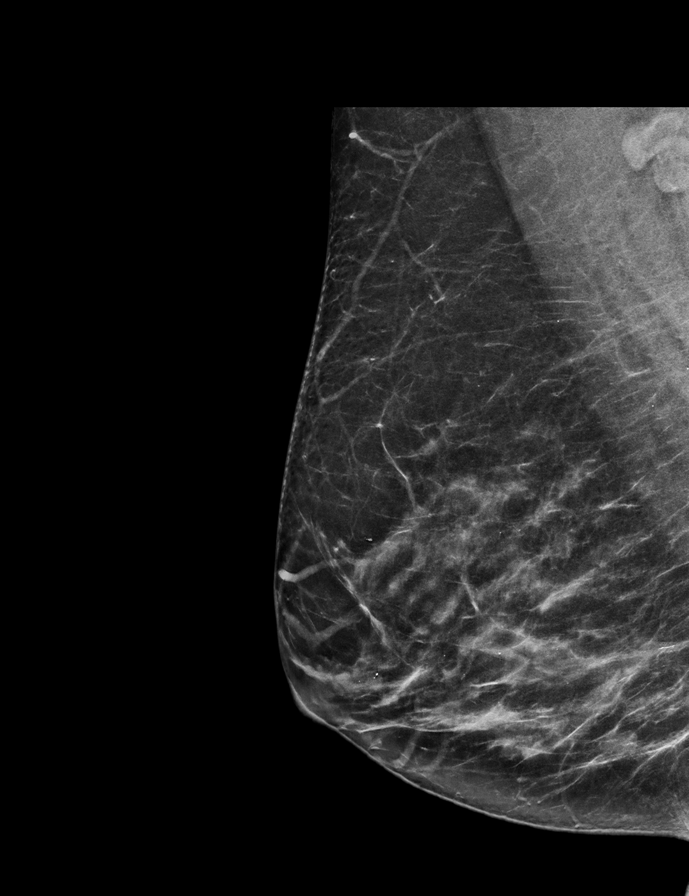

[R CC]
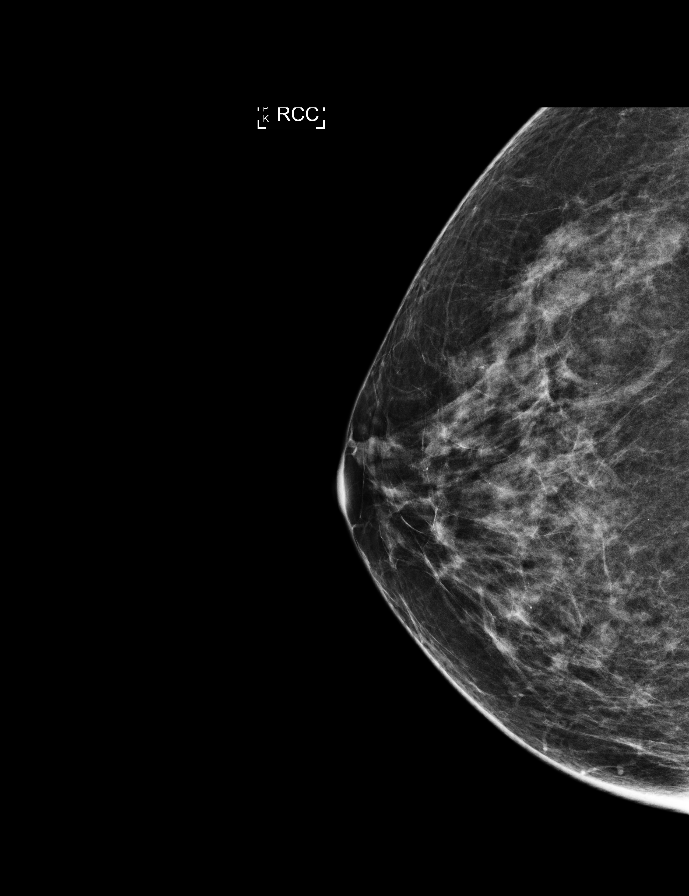

[L CC tomo · tomo slice 37/73.0]
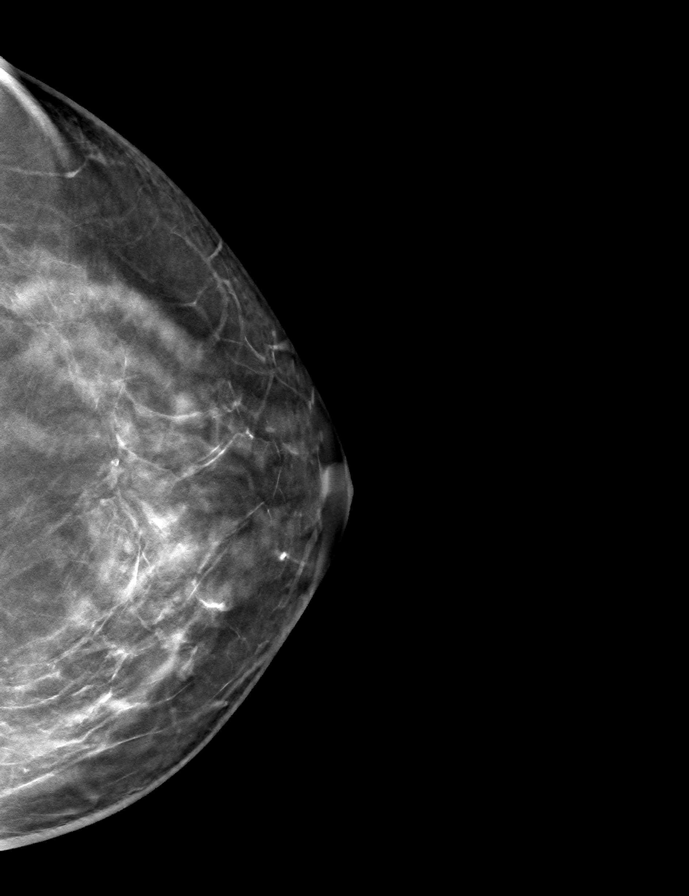

[9 of 28 positions shown; findings below may reference images not displayed]

ACR Breast Density Category c: The breast tissue is heterogeneously
dense, which may obscure small masses.
FINDINGS: There are no findings suspicious for malignancy. Images were
processed with CAD.
IMPRESSION: No mammographic evidence of malignancy. A result letter of this
screening mammogram will be mailed directly to the patient.

RECOMMENDATION:
Screening mammogram in one year. (Code:[id number])

BI-RADS CATEGORY  1: Negative.

## 2018-06-17 DIAGNOSIS — Z23 Encounter for immunization: Secondary | ICD-10-CM | POA: Diagnosis not present

## 2018-07-21 ENCOUNTER — Other Ambulatory Visit: Payer: Self-pay | Admitting: Hematology and Oncology

## 2018-07-21 DIAGNOSIS — C9110 Chronic lymphocytic leukemia of B-cell type not having achieved remission: Secondary | ICD-10-CM

## 2018-07-21 DIAGNOSIS — R748 Abnormal levels of other serum enzymes: Secondary | ICD-10-CM

## 2018-07-22 ENCOUNTER — Encounter: Payer: Self-pay | Admitting: Hematology and Oncology

## 2018-07-22 ENCOUNTER — Telehealth: Payer: Self-pay | Admitting: Hematology and Oncology

## 2018-07-22 ENCOUNTER — Inpatient Hospital Stay: Payer: PPO

## 2018-07-22 ENCOUNTER — Inpatient Hospital Stay: Payer: PPO | Attending: Hematology and Oncology | Admitting: Hematology and Oncology

## 2018-07-22 VITALS — BP 141/65 | HR 74 | Temp 98.3°F | Resp 18 | Ht 63.0 in | Wt 175.0 lb

## 2018-07-22 DIAGNOSIS — R748 Abnormal levels of other serum enzymes: Secondary | ICD-10-CM

## 2018-07-22 DIAGNOSIS — M79662 Pain in left lower leg: Secondary | ICD-10-CM | POA: Insufficient documentation

## 2018-07-22 DIAGNOSIS — C9110 Chronic lymphocytic leukemia of B-cell type not having achieved remission: Secondary | ICD-10-CM | POA: Diagnosis present

## 2018-07-22 DIAGNOSIS — Z8672 Personal history of thrombophlebitis: Secondary | ICD-10-CM | POA: Insufficient documentation

## 2018-07-22 DIAGNOSIS — Z7982 Long term (current) use of aspirin: Secondary | ICD-10-CM | POA: Diagnosis not present

## 2018-07-22 LAB — CBC WITH DIFFERENTIAL/PLATELET
Abs Immature Granulocytes: 0.06 10*3/uL (ref 0.00–0.07)
BASOS PCT: 0 %
Basophils Absolute: 0.1 10*3/uL (ref 0.0–0.1)
EOS ABS: 0.2 10*3/uL (ref 0.0–0.5)
EOS PCT: 0 %
HCT: 41.9 % (ref 36.0–46.0)
Hemoglobin: 13.6 g/dL (ref 12.0–15.0)
Immature Granulocytes: 0 %
Lymphocytes Relative: 88 %
Lymphs Abs: 37.9 10*3/uL — ABNORMAL HIGH (ref 0.7–4.0)
MCH: 30.4 pg (ref 26.0–34.0)
MCHC: 32.5 g/dL (ref 30.0–36.0)
MCV: 93.7 fL (ref 80.0–100.0)
MONO ABS: 0.9 10*3/uL (ref 0.1–1.0)
Monocytes Relative: 2 %
NEUTROS ABS: 4.4 10*3/uL (ref 1.7–7.7)
Neutrophils Relative %: 10 %
Platelets: 213 10*3/uL (ref 150–400)
RBC: 4.47 MIL/uL (ref 3.87–5.11)
RDW: 13.3 % (ref 11.5–15.5)
WBC: 43.5 10*3/uL — AB (ref 4.0–10.5)
nRBC: 0 % (ref 0.0–0.2)

## 2018-07-22 LAB — COMPREHENSIVE METABOLIC PANEL
ALT: 29 U/L (ref 0–44)
ANION GAP: 8 (ref 5–15)
AST: 24 U/L (ref 15–41)
Albumin: 3.9 g/dL (ref 3.5–5.0)
Alkaline Phosphatase: 97 U/L (ref 38–126)
BILIRUBIN TOTAL: 0.4 mg/dL (ref 0.3–1.2)
BUN: 14 mg/dL (ref 8–23)
CO2: 28 mmol/L (ref 22–32)
Calcium: 10.6 mg/dL — ABNORMAL HIGH (ref 8.9–10.3)
Chloride: 108 mmol/L (ref 98–111)
Creatinine, Ser: 0.79 mg/dL (ref 0.44–1.00)
Glucose, Bld: 77 mg/dL (ref 70–99)
POTASSIUM: 4.4 mmol/L (ref 3.5–5.1)
Sodium: 144 mmol/L (ref 135–145)
TOTAL PROTEIN: 6.6 g/dL (ref 6.5–8.1)

## 2018-07-22 LAB — LACTATE DEHYDROGENASE: LDH: 168 U/L (ref 98–192)

## 2018-07-22 NOTE — Assessment & Plan Note (Signed)
She had history of superficial thrombophlebitis which has resolved I recommend reducing aspirin to 81 mg daily

## 2018-07-22 NOTE — Progress Notes (Signed)
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 We will observe. I plan to see her back in a year for further follow-up We discussed the importance of annual influenza vaccination and to have patient is educated to watch out for signs and symptoms of cancer progression  Pain in left lower leg She had history of superficial thrombophlebitis which has resolved I recommend reducing aspirin to 81 mg daily   Orders Placed This Encounter  Procedures  . CBC with Differential/Platelet    Standing Status:   Future    Standing Expiration Date:   08/26/2019    INTERVAL HISTORY: Please see below for problem oriented charting. She returns for CLL follow-up She feels well No lymphadenopathy Denies recent infection, fever or chills Denies abnormal weight loss or night sweats She bruises easily on aspirin  SUMMARY OF ONCOLOGIC HISTORY:   CLL (chronic lymphocytic leukemia) ([place])   06/12/2012 Initial Diagnosis    CLL (chronic lymphocytic leukemia)    07/20/2014 Pathology Results    FISH was normal     This is a very pleasant lady was diagnosed with CLL since 2010. She was noted to have chronic leukocytosis. In 2010, peripheral blood flow cytometry confirmed the diagnosis of CLL. CT scan of the chest, abdomen, and pelvis show no evidence of lymphadenopathy. She was given a stage 0 and placed on observation  REVIEW OF SYSTEMS:   Constitutional: Denies fevers, chills or abnormal weight loss Eyes: Denies blurriness of vision Ears, nose, mouth, throat, and face: Denies mucositis or sore throat Respiratory: Denies cough, dyspnea or wheezes Cardiovascular:  Denies palpitation, chest discomfort or lower extremity swelling Gastrointestinal:  Denies nausea, heartburn or change in bowel habits Skin: Denies abnormal skin rashes Lymphatics: Denies new lymphadenopathy  Neurological:Denies numbness, tingling or new weaknesses Behavioral/Psych: Mood is stable, no new changes  All other systems were reviewed with the patient and are negative.  I have reviewed the past medical history, past surgical history, social history and family history with the patient and they are unchanged from previous note.  ALLERGIES:  has No Known Allergies.  MEDICATIONS:  Current Outpatient Medications  Medication Sig Dispense Refill  . aspirin 325 MG tablet Take 325 mg by mouth daily.    Marland Kitchen BIOTIN PO Take by mouth daily.    . cholecalciferol (VITAMIN D) 1000 UNITS tablet Take 1,000 Units by mouth daily.    Marland Kitchen levothyroxine (SYNTHROID, LEVOTHROID) 88 MCG tablet Take 75 mcg by mouth daily.    . Multiple Vitamin (MULTIVITAMIN) tablet Take 1 tablet by mouth daily.    Marland Kitchen omeprazole (PRILOSEC) 20 MG capsule Take 20 mg by mouth daily.      No current facility-administered medications for this visit.     PHYSICAL EXAMINATION: ECOG PERFORMANCE STATUS: 0 - Asymptomatic  Vitals:   07/22/18 1128  BP: (!) 141/65  Pulse: 74  Resp: 18  Temp: 98.3 F (36.8 C)  SpO2: 98%   Filed Weights   07/22/18 1128  Weight: 175 lb (79.4 kg)    GENERAL:alert, no distress and comfortable SKIN: skin color, texture, turgor are normal, no rashes or significant lesions EYES: normal, Conjunctiva are pink and non-injected, sclera clear OROPHARYNX:no exudate, no erythema  and lips, buccal mucosa, and tongue normal  NECK: supple, thyroid normal size, non-tender, without nodularity LYMPH:  no palpable lymphadenopathy in the cervical, axillary or inguinal LUNGS: clear to auscultation and percussion with normal breathing effort HEART: regular rate & rhythm and no murmurs and no lower extremity  edema ABDOMEN:abdomen soft, non-tender and normal bowel sounds Musculoskeletal:no cyanosis of digits and no clubbing  NEURO: alert & oriented x 3 with fluent speech, no focal motor/sensory deficits  LABORATORY DATA:  I have reviewed the data as listed    Component Value Date/Time   NA 144 07/22/2018 1111   NA 143 07/19/2016 0910   K 4.4 07/22/2018 1111   K 4.6 07/19/2016 0910   CL 108 07/22/2018 1111   CL 104 12/11/2012 1408   CO2 28 07/22/2018 1111   CO2 28 07/19/2016 0910   GLUCOSE 77 07/22/2018 1111   GLUCOSE 115 07/19/2016 0910   GLUCOSE 94 12/11/2012 1408   BUN 14 07/22/2018 1111   BUN 11.5 07/19/2016 0910   CREATININE 0.79 07/22/2018 1111   CREATININE 0.8 07/19/2016 0910   CALCIUM 10.6 (H) 07/22/2018 1111   CALCIUM 10.6 (H) 07/19/2016 0910   PROT 6.6 07/22/2018 1111   PROT 6.9 07/19/2016 0910   ALBUMIN 3.9 07/22/2018 1111   ALBUMIN 3.8 07/19/2016 0910   AST 24 07/22/2018 1111   AST 28 07/19/2016 0910   ALT 29 07/22/2018 1111   ALT 52 07/19/2016 0910   ALKPHOS 97 07/22/2018 1111   ALKPHOS 114 07/19/2016 0910   BILITOT 0.4 07/22/2018 1111   BILITOT 0.32 07/19/2016 0910   GFRNONAA >60 07/22/2018 1111   GFRAA >60 07/22/2018 1111    No results found for: SPEP, UPEP  Lab Results  Component Value Date   WBC 43.5 (H) 07/22/2018   NEUTROABS PENDING 07/22/2018   HGB 13.6 07/22/2018   HCT 41.9 07/22/2018   MCV 93.7 07/22/2018   PLT 213 07/22/2018      Chemistry      Component Value Date/Time   NA 144 07/22/2018 1111   NA 143 07/19/2016 0910   K 4.4 07/22/2018 1111   K 4.6 07/19/2016 0910   CL 108 07/22/2018 1111   CL 104 12/11/2012 1408   CO2 28 07/22/2018 1111   CO2 28 07/19/2016 0910   BUN 14 07/22/2018 1111   BUN 11.5 07/19/2016 0910   CREATININE 0.79 07/22/2018 1111   CREATININE 0.8 07/19/2016 0910      Component Value Date/Time   CALCIUM 10.6 (H) 07/22/2018 1111   CALCIUM 10.6 (H) 07/19/2016 0910   ALKPHOS 97 07/22/2018 1111   ALKPHOS 114  07/19/2016 0910   AST 24 07/22/2018 1111   AST 28 07/19/2016 0910   ALT 29 07/22/2018 1111   ALT 52 07/19/2016 0910   BILITOT 0.4 07/22/2018 1111   BILITOT 0.32 07/19/2016 0910      All questions were answered. The patient knows to call the clinic with any problems, questions or concerns. No barriers to learning was detected.  I spent 10 minutes counseling the patient face to face. The total time spent in the appointment was 15 minutes and more than 50% was on counseling and review of test results  [name], MD 07/22/2018 11:53 AM

## 2018-07-22 NOTE — Assessment & Plan Note (Signed)
Her CBC is slighyly increased without associated anemia or thrombocytopenia She is not symptomatic She does not need treatment for this for now. We will observe. I plan to see her back in a year for further follow-up We discussed the importance of annual influenza vaccination and to have patient is educated to watch out for signs and symptoms of cancer progression

## 2018-07-22 NOTE — Telephone Encounter (Signed)
Gave patient avs and calendar.   °

## 2018-07-23 ENCOUNTER — Other Ambulatory Visit: Payer: Self-pay | Admitting: Hematology and Oncology

## 2018-07-23 DIAGNOSIS — Z1231 Encounter for screening mammogram for malignant neoplasm of breast: Secondary | ICD-10-CM

## 2018-09-25 ENCOUNTER — Ambulatory Visit
Admission: RE | Admit: 2018-09-25 | Discharge: 2018-09-25 | Disposition: A | Discharge status: Home / Self Care | Payer: PPO | Source: Ambulatory Visit | Attending: Hematology and Oncology | Admitting: Hematology and Oncology

## 2018-09-25 DIAGNOSIS — Z1231 Encounter for screening mammogram for malignant neoplasm of breast: Secondary | ICD-10-CM

## 2018-11-20 DIAGNOSIS — M25512 Pain in left shoulder: Secondary | ICD-10-CM | POA: Diagnosis not present

## 2019-05-26 DIAGNOSIS — Z0000 Encounter for general adult medical examination without abnormal findings: Secondary | ICD-10-CM | POA: Diagnosis not present

## 2019-07-28 ENCOUNTER — Other Ambulatory Visit: Payer: Self-pay

## 2019-07-28 ENCOUNTER — Encounter: Payer: Self-pay | Admitting: Hematology and Oncology

## 2019-07-28 ENCOUNTER — Inpatient Hospital Stay (HOSPITAL_BASED_OUTPATIENT_CLINIC_OR_DEPARTMENT_OTHER): Payer: PPO | Admitting: Hematology and Oncology

## 2019-07-28 ENCOUNTER — Inpatient Hospital Stay: Payer: PPO | Attending: Hematology and Oncology

## 2019-07-28 ENCOUNTER — Telehealth: Payer: Self-pay | Admitting: Hematology and Oncology

## 2019-07-28 DIAGNOSIS — R030 Elevated blood-pressure reading, without diagnosis of hypertension: Secondary | ICD-10-CM | POA: Insufficient documentation

## 2019-07-28 DIAGNOSIS — C9110 Chronic lymphocytic leukemia of B-cell type not having achieved remission: Secondary | ICD-10-CM

## 2019-07-28 LAB — CBC WITH DIFFERENTIAL/PLATELET
Abs Immature Granulocytes: 0.06 10*3/uL (ref 0.00–0.07)
Basophils Absolute: 0.2 10*3/uL — ABNORMAL HIGH (ref 0.0–0.1)
Basophils Relative: 0 %
Eosinophils Absolute: 0.2 10*3/uL (ref 0.0–0.5)
Eosinophils Relative: 0 %
HCT: 43.6 % (ref 36.0–46.0)
Hemoglobin: 14.1 g/dL (ref 12.0–15.0)
Immature Granulocytes: 0 %
Lymphocytes Relative: 89 %
Lymphs Abs: 43.1 10*3/uL — ABNORMAL HIGH (ref 0.7–4.0)
MCH: 30.1 pg (ref 26.0–34.0)
MCHC: 32.3 g/dL (ref 30.0–36.0)
MCV: 93.2 fL (ref 80.0–100.0)
Monocytes Absolute: 0.4 10*3/uL (ref 0.1–1.0)
Monocytes Relative: 1 %
Neutro Abs: 5 10*3/uL (ref 1.7–7.7)
Neutrophils Relative %: 10 %
Platelets: 221 10*3/uL (ref 150–400)
RBC: 4.68 MIL/uL (ref 3.87–5.11)
RDW: 13.3 % (ref 11.5–15.5)
WBC: 48.8 10*3/uL — ABNORMAL HIGH (ref 4.0–10.5)
nRBC: 0 % (ref 0.0–0.2)

## 2019-07-28 NOTE — Telephone Encounter (Signed)
Scheduled appt per 11/3 sch message - mailed reminder letter with appt date and time   

## 2019-07-28 NOTE — Assessment & Plan Note (Signed)
Her CBC is slighyly increased without associated anemia or thrombocytopenia She is not symptomatic She does not need treatment for this for now. We will observe. I plan to see her back in a year for further follow-up She is up-to-date with influenza vaccination and screening program

## 2019-07-28 NOTE — Progress Notes (Signed)
[facility] OFFICE PROGRESS NOTE  Patient Care Team: Lawerance Cruel, MD as PCP - General (Family Medicine) Delrae Rend, MD as Attending Physician (Internal Medicine)  ASSESSMENT & PLAN:  CLL (chronic lymphocytic leukemia) Her CBC is slighyly increased without associated anemia or thrombocytopenia She is not symptomatic She does not need treatment for this for now. We will observe. I plan to see her back in a year for further follow-up She is up-to-date with influenza vaccination and screening program  Elevated BP without diagnosis of hypertension Her blood pressure is intermittently elevated She is not symptomatic I suspect it is attributed by whitecoat hypertension/anxiety Recommend observation only We discussed the importance of dietary modification such as exercise more and healthy dietary choices She will continue to follow-up with her primary care doctor for management   No orders of the defined types were placed in this encounter.   INTERVAL HISTORY: Please see below for problem oriented charting. She returns for CLL follow-up She feels well No new lymphadenopathy Denies recent infection, fever or chills  SUMMARY OF ONCOLOGIC HISTORY: Oncology History  CLL (chronic lymphocytic leukemia) ([place])  06/12/2012 Initial Diagnosis   CLL (chronic lymphocytic leukemia)   07/20/2014 Pathology Results   FISH was normal     REVIEW OF SYSTEMS:   Constitutional: Denies fevers, chills or abnormal weight loss Eyes: Denies blurriness of vision Ears, nose, mouth, throat, and face: Denies mucositis or sore throat Respiratory: Denies cough, dyspnea or wheezes Cardiovascular: Denies palpitation, chest discomfort or lower extremity swelling Gastrointestinal:  Denies nausea, heartburn or change in bowel habits Skin: Denies abnormal skin rashes Lymphatics: Denies new lymphadenopathy or easy bruising Neurological:Denies numbness, tingling or new  weaknesses Behavioral/Psych: Mood is stable, no new changes  All other systems were reviewed with the patient and are negative.  I have reviewed the past medical history, past surgical history, social history and family history with the patient and they are unchanged from previous note.  ALLERGIES:  has No Known Allergies.  MEDICATIONS:  Current Outpatient Medications  Medication Sig Dispense Refill  . aspirin 325 MG tablet Take 325 mg by mouth daily.    Marland Kitchen BIOTIN PO Take by mouth daily.    . cholecalciferol (VITAMIN D) 1000 UNITS tablet Take 1,000 Units by mouth daily.    Marland Kitchen levothyroxine (SYNTHROID, LEVOTHROID) 88 MCG tablet Take 75 mcg by mouth daily.    . Multiple Vitamin (MULTIVITAMIN) tablet Take 1 tablet by mouth daily.    Marland Kitchen omeprazole (PRILOSEC) 20 MG capsule Take 20 mg by mouth daily.      No current facility-administered medications for this visit.     PHYSICAL EXAMINATION: ECOG PERFORMANCE STATUS: 0 - Asymptomatic  Vitals:   07/28/19 1044  BP: (!) 157/84  Pulse: 78  Resp: 17  Temp: 98.3 F (36.8 C)  SpO2: 100%   Filed Weights   07/28/19 1044  Weight: 175 lb 9.6 oz (79.7 kg)    GENERAL:alert, no distress and comfortable SKIN: skin color, texture, turgor are normal, no rashes or significant lesions EYES: normal, Conjunctiva are pink and non-injected, sclera clear OROPHARYNX:no exudate, no erythema and lips, buccal mucosa, and tongue normal  NECK: supple, thyroid normal size, non-tender, without nodularity LYMPH:  no palpable lymphadenopathy in the cervical, axillary or inguinal LUNGS: clear to auscultation and percussion with normal breathing effort HEART: regular rate & rhythm and no murmurs and no lower extremity edema ABDOMEN:abdomen soft, non-tender and normal bowel sounds Musculoskeletal:no cyanosis of digits and no clubbing  NEURO: alert & oriented x 3 with fluent speech, no focal motor/sensory deficits  LABORATORY DATA:  I have reviewed the data as  listed    Component Value Date/Time   NA 144 07/22/2018 1111   NA 143 07/19/2016 0910   K 4.4 07/22/2018 1111   K 4.6 07/19/2016 0910   CL 108 07/22/2018 1111   CL 104 12/11/2012 1408   CO2 28 07/22/2018 1111   CO2 28 07/19/2016 0910   GLUCOSE 77 07/22/2018 1111   GLUCOSE 115 07/19/2016 0910   GLUCOSE 94 12/11/2012 1408   BUN 14 07/22/2018 1111   BUN 11.5 07/19/2016 0910   CREATININE 0.79 07/22/2018 1111   CREATININE 0.8 07/19/2016 0910   CALCIUM 10.6 (H) 07/22/2018 1111   CALCIUM 10.6 (H) 07/19/2016 0910   PROT 6.6 07/22/2018 1111   PROT 6.9 07/19/2016 0910   ALBUMIN 3.9 07/22/2018 1111   ALBUMIN 3.8 07/19/2016 0910   AST 24 07/22/2018 1111   AST 28 07/19/2016 0910   ALT 29 07/22/2018 1111   ALT 52 07/19/2016 0910   ALKPHOS 97 07/22/2018 1111   ALKPHOS 114 07/19/2016 0910   BILITOT 0.4 07/22/2018 1111   BILITOT 0.32 07/19/2016 0910   GFRNONAA >60 07/22/2018 1111   GFRAA >60 07/22/2018 1111    No results found for: SPEP, UPEP  Lab Results  Component Value Date   WBC 48.8 (H) 07/28/2019   NEUTROABS PENDING 07/28/2019   HGB 14.1 07/28/2019   HCT 43.6 07/28/2019   MCV 93.2 07/28/2019   PLT 221 07/28/2019      Chemistry      Component Value Date/Time   NA 144 07/22/2018 1111   NA 143 07/19/2016 0910   K 4.4 07/22/2018 1111   K 4.6 07/19/2016 0910   CL 108 07/22/2018 1111   CL 104 12/11/2012 1408   CO2 28 07/22/2018 1111   CO2 28 07/19/2016 0910   BUN 14 07/22/2018 1111   BUN 11.5 07/19/2016 0910   CREATININE 0.79 07/22/2018 1111   CREATININE 0.8 07/19/2016 0910      Component Value Date/Time   CALCIUM 10.6 (H) 07/22/2018 1111   CALCIUM 10.6 (H) 07/19/2016 0910   ALKPHOS 97 07/22/2018 1111   ALKPHOS 114 07/19/2016 0910   AST 24 07/22/2018 1111   AST 28 07/19/2016 0910   ALT 29 07/22/2018 1111   ALT 52 07/19/2016 0910   BILITOT 0.4 07/22/2018 1111   BILITOT 0.32 07/19/2016 0910       All questions were answered. The patient knows to call the  clinic with any problems, questions or concerns. No barriers to learning was detected.  I spent 10 minutes counseling the patient face to face. The total time spent in the appointment was 15 minutes and more than 50% was on counseling and review of test results  [name], MD 07/28/2019 10:52 AM

## 2019-07-28 NOTE — Assessment & Plan Note (Signed)
Her blood pressure is intermittently elevated She is not symptomatic I suspect it is attributed by whitecoat hypertension/anxiety Recommend observation only We discussed the importance of dietary modification such as exercise more and healthy dietary choices She will continue to follow-up with her primary care doctor for management

## 2019-09-03 DIAGNOSIS — E039 Hypothyroidism, unspecified: Secondary | ICD-10-CM | POA: Diagnosis not present

## 2019-09-03 DIAGNOSIS — E210 Primary hyperparathyroidism: Secondary | ICD-10-CM | POA: Diagnosis not present

## 2019-09-07 DIAGNOSIS — E210 Primary hyperparathyroidism: Secondary | ICD-10-CM | POA: Diagnosis not present

## 2019-09-07 DIAGNOSIS — E039 Hypothyroidism, unspecified: Secondary | ICD-10-CM | POA: Diagnosis not present

## 2019-09-07 DIAGNOSIS — M8580 Other specified disorders of bone density and structure, unspecified site: Secondary | ICD-10-CM | POA: Diagnosis not present

## 2019-10-19 ENCOUNTER — Ambulatory Visit: Payer: PPO

## 2019-10-20 ENCOUNTER — Ambulatory Visit: Payer: PPO

## 2019-10-29 ENCOUNTER — Ambulatory Visit: Payer: PPO

## 2019-10-29 ENCOUNTER — Other Ambulatory Visit: Payer: Self-pay | Admitting: Hematology and Oncology

## 2019-10-29 DIAGNOSIS — Z1231 Encounter for screening mammogram for malignant neoplasm of breast: Secondary | ICD-10-CM

## 2019-11-30 DIAGNOSIS — M545 Low back pain: Secondary | ICD-10-CM | POA: Diagnosis not present

## 2019-12-07 DIAGNOSIS — M545 Low back pain: Secondary | ICD-10-CM | POA: Diagnosis not present

## 2019-12-07 DIAGNOSIS — M5416 Radiculopathy, lumbar region: Secondary | ICD-10-CM | POA: Diagnosis not present

## 2019-12-07 DIAGNOSIS — M2560 Stiffness of unspecified joint, not elsewhere classified: Secondary | ICD-10-CM | POA: Diagnosis not present

## 2019-12-07 DIAGNOSIS — R262 Difficulty in walking, not elsewhere classified: Secondary | ICD-10-CM | POA: Diagnosis not present

## 2019-12-09 ENCOUNTER — Ambulatory Visit: Payer: PPO

## 2019-12-09 DIAGNOSIS — M545 Low back pain: Secondary | ICD-10-CM | POA: Diagnosis not present

## 2019-12-09 DIAGNOSIS — R262 Difficulty in walking, not elsewhere classified: Secondary | ICD-10-CM | POA: Diagnosis not present

## 2019-12-09 DIAGNOSIS — M2560 Stiffness of unspecified joint, not elsewhere classified: Secondary | ICD-10-CM | POA: Diagnosis not present

## 2019-12-09 DIAGNOSIS — M5416 Radiculopathy, lumbar region: Secondary | ICD-10-CM | POA: Diagnosis not present

## 2019-12-11 DIAGNOSIS — M545 Low back pain: Secondary | ICD-10-CM | POA: Diagnosis not present

## 2019-12-11 DIAGNOSIS — R262 Difficulty in walking, not elsewhere classified: Secondary | ICD-10-CM | POA: Diagnosis not present

## 2019-12-11 DIAGNOSIS — M2560 Stiffness of unspecified joint, not elsewhere classified: Secondary | ICD-10-CM | POA: Diagnosis not present

## 2019-12-11 DIAGNOSIS — M5416 Radiculopathy, lumbar region: Secondary | ICD-10-CM | POA: Diagnosis not present

## 2019-12-14 DIAGNOSIS — M5416 Radiculopathy, lumbar region: Secondary | ICD-10-CM | POA: Diagnosis not present

## 2019-12-14 DIAGNOSIS — M545 Low back pain: Secondary | ICD-10-CM | POA: Diagnosis not present

## 2019-12-14 DIAGNOSIS — M2560 Stiffness of unspecified joint, not elsewhere classified: Secondary | ICD-10-CM | POA: Diagnosis not present

## 2019-12-14 DIAGNOSIS — R262 Difficulty in walking, not elsewhere classified: Secondary | ICD-10-CM | POA: Diagnosis not present

## 2019-12-16 DIAGNOSIS — M2560 Stiffness of unspecified joint, not elsewhere classified: Secondary | ICD-10-CM | POA: Diagnosis not present

## 2019-12-16 DIAGNOSIS — M545 Low back pain: Secondary | ICD-10-CM | POA: Diagnosis not present

## 2019-12-16 DIAGNOSIS — R262 Difficulty in walking, not elsewhere classified: Secondary | ICD-10-CM | POA: Diagnosis not present

## 2019-12-16 DIAGNOSIS — M5416 Radiculopathy, lumbar region: Secondary | ICD-10-CM | POA: Diagnosis not present

## 2019-12-18 DIAGNOSIS — M5416 Radiculopathy, lumbar region: Secondary | ICD-10-CM | POA: Diagnosis not present

## 2019-12-18 DIAGNOSIS — R262 Difficulty in walking, not elsewhere classified: Secondary | ICD-10-CM | POA: Diagnosis not present

## 2019-12-18 DIAGNOSIS — M2560 Stiffness of unspecified joint, not elsewhere classified: Secondary | ICD-10-CM | POA: Diagnosis not present

## 2019-12-18 DIAGNOSIS — M545 Low back pain: Secondary | ICD-10-CM | POA: Diagnosis not present

## 2019-12-21 DIAGNOSIS — M545 Low back pain: Secondary | ICD-10-CM | POA: Diagnosis not present

## 2019-12-21 DIAGNOSIS — R262 Difficulty in walking, not elsewhere classified: Secondary | ICD-10-CM | POA: Diagnosis not present

## 2019-12-21 DIAGNOSIS — M5416 Radiculopathy, lumbar region: Secondary | ICD-10-CM | POA: Diagnosis not present

## 2019-12-21 DIAGNOSIS — M2560 Stiffness of unspecified joint, not elsewhere classified: Secondary | ICD-10-CM | POA: Diagnosis not present

## 2019-12-23 DIAGNOSIS — M2560 Stiffness of unspecified joint, not elsewhere classified: Secondary | ICD-10-CM | POA: Diagnosis not present

## 2019-12-23 DIAGNOSIS — M545 Low back pain: Secondary | ICD-10-CM | POA: Diagnosis not present

## 2019-12-23 DIAGNOSIS — M5416 Radiculopathy, lumbar region: Secondary | ICD-10-CM | POA: Diagnosis not present

## 2019-12-23 DIAGNOSIS — R262 Difficulty in walking, not elsewhere classified: Secondary | ICD-10-CM | POA: Diagnosis not present

## 2019-12-25 ENCOUNTER — Ambulatory Visit
Admission: RE | Admit: 2019-12-25 | Discharge: 2019-12-25 | Disposition: A | Discharge status: Home / Self Care | Payer: PPO | Source: Ambulatory Visit | Attending: Hematology and Oncology | Admitting: Hematology and Oncology

## 2019-12-25 ENCOUNTER — Other Ambulatory Visit: Payer: Self-pay

## 2019-12-25 DIAGNOSIS — M5416 Radiculopathy, lumbar region: Secondary | ICD-10-CM | POA: Diagnosis not present

## 2019-12-25 DIAGNOSIS — M2560 Stiffness of unspecified joint, not elsewhere classified: Secondary | ICD-10-CM | POA: Diagnosis not present

## 2019-12-25 DIAGNOSIS — Z1231 Encounter for screening mammogram for malignant neoplasm of breast: Secondary | ICD-10-CM | POA: Diagnosis not present

## 2019-12-25 DIAGNOSIS — M545 Low back pain: Secondary | ICD-10-CM | POA: Diagnosis not present

## 2019-12-25 DIAGNOSIS — R262 Difficulty in walking, not elsewhere classified: Secondary | ICD-10-CM | POA: Diagnosis not present

## 2019-12-28 DIAGNOSIS — M545 Low back pain: Secondary | ICD-10-CM | POA: Diagnosis not present

## 2019-12-28 DIAGNOSIS — R262 Difficulty in walking, not elsewhere classified: Secondary | ICD-10-CM | POA: Diagnosis not present

## 2019-12-28 DIAGNOSIS — M5416 Radiculopathy, lumbar region: Secondary | ICD-10-CM | POA: Diagnosis not present

## 2019-12-28 DIAGNOSIS — M2560 Stiffness of unspecified joint, not elsewhere classified: Secondary | ICD-10-CM | POA: Diagnosis not present

## 2019-12-29 DIAGNOSIS — M5416 Radiculopathy, lumbar region: Secondary | ICD-10-CM | POA: Diagnosis not present

## 2019-12-29 DIAGNOSIS — M545 Low back pain: Secondary | ICD-10-CM | POA: Diagnosis not present

## 2019-12-29 DIAGNOSIS — M2560 Stiffness of unspecified joint, not elsewhere classified: Secondary | ICD-10-CM | POA: Diagnosis not present

## 2019-12-29 DIAGNOSIS — R262 Difficulty in walking, not elsewhere classified: Secondary | ICD-10-CM | POA: Diagnosis not present

## 2020-01-04 DIAGNOSIS — M5416 Radiculopathy, lumbar region: Secondary | ICD-10-CM | POA: Diagnosis not present

## 2020-01-04 DIAGNOSIS — R262 Difficulty in walking, not elsewhere classified: Secondary | ICD-10-CM | POA: Diagnosis not present

## 2020-01-04 DIAGNOSIS — M2560 Stiffness of unspecified joint, not elsewhere classified: Secondary | ICD-10-CM | POA: Diagnosis not present

## 2020-01-04 DIAGNOSIS — M545 Low back pain: Secondary | ICD-10-CM | POA: Diagnosis not present

## 2020-01-06 DIAGNOSIS — M2560 Stiffness of unspecified joint, not elsewhere classified: Secondary | ICD-10-CM | POA: Diagnosis not present

## 2020-01-06 DIAGNOSIS — M5416 Radiculopathy, lumbar region: Secondary | ICD-10-CM | POA: Diagnosis not present

## 2020-01-06 DIAGNOSIS — M545 Low back pain: Secondary | ICD-10-CM | POA: Diagnosis not present

## 2020-01-06 DIAGNOSIS — R262 Difficulty in walking, not elsewhere classified: Secondary | ICD-10-CM | POA: Diagnosis not present

## 2020-01-12 DIAGNOSIS — M545 Low back pain: Secondary | ICD-10-CM | POA: Diagnosis not present

## 2020-01-12 DIAGNOSIS — M5416 Radiculopathy, lumbar region: Secondary | ICD-10-CM | POA: Diagnosis not present

## 2020-01-12 DIAGNOSIS — M2560 Stiffness of unspecified joint, not elsewhere classified: Secondary | ICD-10-CM | POA: Diagnosis not present

## 2020-01-12 DIAGNOSIS — R262 Difficulty in walking, not elsewhere classified: Secondary | ICD-10-CM | POA: Diagnosis not present

## 2020-01-14 DIAGNOSIS — M5416 Radiculopathy, lumbar region: Secondary | ICD-10-CM | POA: Diagnosis not present

## 2020-01-14 DIAGNOSIS — R262 Difficulty in walking, not elsewhere classified: Secondary | ICD-10-CM | POA: Diagnosis not present

## 2020-01-14 DIAGNOSIS — M545 Low back pain: Secondary | ICD-10-CM | POA: Diagnosis not present

## 2020-01-14 DIAGNOSIS — M2560 Stiffness of unspecified joint, not elsewhere classified: Secondary | ICD-10-CM | POA: Diagnosis not present

## 2020-01-19 DIAGNOSIS — M2560 Stiffness of unspecified joint, not elsewhere classified: Secondary | ICD-10-CM | POA: Diagnosis not present

## 2020-01-19 DIAGNOSIS — M545 Low back pain: Secondary | ICD-10-CM | POA: Diagnosis not present

## 2020-01-19 DIAGNOSIS — M5416 Radiculopathy, lumbar region: Secondary | ICD-10-CM | POA: Diagnosis not present

## 2020-01-19 DIAGNOSIS — R262 Difficulty in walking, not elsewhere classified: Secondary | ICD-10-CM | POA: Diagnosis not present

## 2020-01-21 DIAGNOSIS — M545 Low back pain: Secondary | ICD-10-CM | POA: Diagnosis not present

## 2020-01-21 DIAGNOSIS — M5416 Radiculopathy, lumbar region: Secondary | ICD-10-CM | POA: Diagnosis not present

## 2020-01-21 DIAGNOSIS — M2560 Stiffness of unspecified joint, not elsewhere classified: Secondary | ICD-10-CM | POA: Diagnosis not present

## 2020-01-21 DIAGNOSIS — R262 Difficulty in walking, not elsewhere classified: Secondary | ICD-10-CM | POA: Diagnosis not present

## 2020-01-26 DIAGNOSIS — I868 Varicose veins of other specified sites: Secondary | ICD-10-CM | POA: Diagnosis not present

## 2020-01-26 DIAGNOSIS — M25551 Pain in right hip: Secondary | ICD-10-CM | POA: Diagnosis not present

## 2020-02-15 DIAGNOSIS — M5416 Radiculopathy, lumbar region: Secondary | ICD-10-CM | POA: Diagnosis not present

## 2020-02-15 DIAGNOSIS — M545 Low back pain: Secondary | ICD-10-CM | POA: Diagnosis not present

## 2020-02-15 DIAGNOSIS — M7061 Trochanteric bursitis, right hip: Secondary | ICD-10-CM | POA: Diagnosis not present

## 2020-07-25 ENCOUNTER — Other Ambulatory Visit: Payer: Self-pay | Admitting: Hematology and Oncology

## 2020-07-25 DIAGNOSIS — C9110 Chronic lymphocytic leukemia of B-cell type not having achieved remission: Secondary | ICD-10-CM

## 2020-07-26 ENCOUNTER — Inpatient Hospital Stay: Payer: PPO | Attending: Hematology and Oncology | Admitting: Hematology and Oncology

## 2020-07-26 ENCOUNTER — Inpatient Hospital Stay: Payer: PPO

## 2020-07-26 ENCOUNTER — Encounter: Payer: Self-pay | Admitting: Hematology and Oncology

## 2020-07-26 ENCOUNTER — Telehealth: Payer: Self-pay | Admitting: Hematology and Oncology

## 2020-07-26 ENCOUNTER — Other Ambulatory Visit: Payer: Self-pay

## 2020-07-26 DIAGNOSIS — C9110 Chronic lymphocytic leukemia of B-cell type not having achieved remission: Secondary | ICD-10-CM

## 2020-07-26 DIAGNOSIS — D229 Melanocytic nevi, unspecified: Secondary | ICD-10-CM | POA: Diagnosis not present

## 2020-07-26 DIAGNOSIS — D225 Melanocytic nevi of trunk: Secondary | ICD-10-CM | POA: Insufficient documentation

## 2020-07-26 DIAGNOSIS — R030 Elevated blood-pressure reading, without diagnosis of hypertension: Secondary | ICD-10-CM

## 2020-07-26 LAB — CBC WITH DIFFERENTIAL/PLATELET
Abs Immature Granulocytes: 0.07 10*3/uL (ref 0.00–0.07)
Basophils Absolute: 0.1 10*3/uL (ref 0.0–0.1)
Basophils Relative: 0 %
Eosinophils Absolute: 0.4 10*3/uL (ref 0.0–0.5)
Eosinophils Relative: 1 %
HCT: 44.6 % (ref 36.0–46.0)
Hemoglobin: 14.2 g/dL (ref 12.0–15.0)
Immature Granulocytes: 0 %
Lymphocytes Relative: 88 %
Lymphs Abs: 44 10*3/uL — ABNORMAL HIGH (ref 0.7–4.0)
MCH: 29.8 pg (ref 26.0–34.0)
MCHC: 31.8 g/dL (ref 30.0–36.0)
MCV: 93.5 fL (ref 80.0–100.0)
Monocytes Absolute: 0.6 10*3/uL (ref 0.1–1.0)
Monocytes Relative: 1 %
Neutro Abs: 4.8 10*3/uL (ref 1.7–7.7)
Neutrophils Relative %: 10 %
Platelets: 225 10*3/uL (ref 150–400)
RBC: 4.77 MIL/uL (ref 3.87–5.11)
RDW: 13.1 % (ref 11.5–15.5)
WBC: 49.9 10*3/uL — ABNORMAL HIGH (ref 4.0–10.5)
nRBC: 0 % (ref 0.0–0.2)

## 2020-07-26 NOTE — Assessment & Plan Note (Signed)
She has a skin mole on her back I recommend dermatologist follow-up She is aware of increased risk of skin cancer due to diagnosis of CLL

## 2020-07-26 NOTE — Assessment & Plan Note (Signed)
Her blood pressure is mildly elevated today but could be due to anxiety Observe closely for now

## 2020-07-26 NOTE — Progress Notes (Signed)
[facility] OFFICE PROGRESS NOTE  Patient Care Team: Lawerance Cruel, MD as PCP - General (Family Medicine) Delrae Rend, MD as Attending Physician (Internal Medicine)  ASSESSMENT & PLAN:  CLL (chronic lymphocytic leukemia) Overall, despite her total lymphocyte count continues to trend up, she have no symptoms I will continue to see her once a year The patient is educated to watch for signs and symptoms of disease progression  Skin mole She has a skin mole on her back I recommend dermatologist follow-up She is aware of increased risk of skin cancer due to diagnosis of CLL  Elevated BP without diagnosis of hypertension Her blood pressure is mildly elevated today but could be due to anxiety Observe closely for now   Orders Placed This Encounter  Procedures  . CBC with Differential/Platelet    Standing Status:   Future    Standing Expiration Date:   07/26/2021    All questions were answered. The patient knows to call the clinic with any problems, questions or concerns. The total time spent in the appointment was 20 minutes encounter with patients including review of chart and various tests results, discussions about plan of care and coordination of care plan   [name], MD 07/26/2020 11:39 AM  INTERVAL HISTORY: Please see below for problem oriented charting. She returns for further follow-up on CLL She denies new lymphadenopathy No recent infection, fever or chills She is wondering whether I can take a look at the mole on her back It is not bothering her She is up-to-date with her vaccination program  SUMMARY OF ONCOLOGIC HISTORY: Oncology History  CLL (chronic lymphocytic leukemia) ([place])  06/12/2012 Initial Diagnosis   CLL (chronic lymphocytic leukemia)   07/20/2014 Pathology Results   FISH was normal     REVIEW OF SYSTEMS:   Constitutional: Denies fevers, chills or abnormal weight loss Eyes: Denies blurriness of vision Ears, nose, mouth,  throat, and face: Denies mucositis or sore throat Respiratory: Denies cough, dyspnea or wheezes Cardiovascular: Denies palpitation, chest discomfort or lower extremity swelling Gastrointestinal:  Denies nausea, heartburn or change in bowel habits Lymphatics: Denies new lymphadenopathy or easy bruising Neurological:Denies numbness, tingling or new weaknesses Behavioral/Psych: Mood is stable, no new changes  All other systems were reviewed with the patient and are negative.  I have reviewed the past medical history, past surgical history, social history and family history with the patient and they are unchanged from previous note.  ALLERGIES:  has No Known Allergies.  MEDICATIONS:  Current Outpatient Medications  Medication Sig Dispense Refill  . aspirin EC 81 MG tablet Take 81 mg by mouth daily. Swallow whole.    Marland Kitchen BIOTIN PO Take by mouth daily.    . cholecalciferol (VITAMIN D) 1000 UNITS tablet Take 1,000 Units by mouth daily. 2,000 units    . levothyroxine (SYNTHROID) 75 MCG tablet Take 75 mcg by mouth daily.    . Multiple Vitamin (MULTIVITAMIN) tablet Take 1 tablet by mouth daily.    Marland Kitchen omeprazole (PRILOSEC) 20 MG capsule Take 20 mg by mouth daily.      No current facility-administered medications for this visit.    PHYSICAL EXAMINATION: ECOG PERFORMANCE STATUS: 1 - Symptomatic but completely ambulatory  Vitals:   07/26/20 1043  BP: (!) 141/66  Pulse: 79  Resp: 18  Temp: (!) 97.5 F (36.4 C)  SpO2: 99%   Filed Weights   07/26/20 1043  Weight: 175 lb 12.8 oz (79.7 kg)    GENERAL:alert, no distress and  comfortable SKIN: Noted a mole on the back.  No other suspicious skin lesion EYES: normal, Conjunctiva are pink and non-injected, sclera clear OROPHARYNX:no exudate, no erythema and lips, buccal mucosa, and tongue normal  NECK: supple, thyroid normal size, non-tender, without nodularity LYMPH:  no palpable lymphadenopathy in the cervical, axillary or inguinal LUNGS:  clear to auscultation and percussion with normal breathing effort HEART: regular rate & rhythm and no murmurs and no lower extremity edema ABDOMEN:abdomen soft, non-tender and normal bowel sounds Musculoskeletal:no cyanosis of digits and no clubbing  NEURO: alert & oriented x 3 with fluent speech, no focal motor/sensory deficits  LABORATORY DATA:  I have reviewed the data as listed    Component Value Date/Time   NA 144 07/22/2018 1111   NA 143 07/19/2016 0910   K 4.4 07/22/2018 1111   K 4.6 07/19/2016 0910   CL 108 07/22/2018 1111   CL 104 12/11/2012 1408   CO2 28 07/22/2018 1111   CO2 28 07/19/2016 0910   GLUCOSE 77 07/22/2018 1111   GLUCOSE 115 07/19/2016 0910   GLUCOSE 94 12/11/2012 1408   BUN 14 07/22/2018 1111   BUN 11.5 07/19/2016 0910   CREATININE 0.79 07/22/2018 1111   CREATININE 0.8 07/19/2016 0910   CALCIUM 10.6 (H) 07/22/2018 1111   CALCIUM 10.6 (H) 07/19/2016 0910   PROT 6.6 07/22/2018 1111   PROT 6.9 07/19/2016 0910   ALBUMIN 3.9 07/22/2018 1111   ALBUMIN 3.8 07/19/2016 0910   AST 24 07/22/2018 1111   AST 28 07/19/2016 0910   ALT 29 07/22/2018 1111   ALT 52 07/19/2016 0910   ALKPHOS 97 07/22/2018 1111   ALKPHOS 114 07/19/2016 0910   BILITOT 0.4 07/22/2018 1111   BILITOT 0.32 07/19/2016 0910   GFRNONAA >60 07/22/2018 1111   GFRAA >60 07/22/2018 1111    No results found for: SPEP, UPEP  Lab Results  Component Value Date   WBC 49.9 (H) 07/26/2020   NEUTROABS 4.8 07/26/2020   HGB 14.2 07/26/2020   HCT 44.6 07/26/2020   MCV 93.5 07/26/2020   PLT 225 07/26/2020      Chemistry      Component Value Date/Time   NA 144 07/22/2018 1111   NA 143 07/19/2016 0910   K 4.4 07/22/2018 1111   K 4.6 07/19/2016 0910   CL 108 07/22/2018 1111   CL 104 12/11/2012 1408   CO2 28 07/22/2018 1111   CO2 28 07/19/2016 0910   BUN 14 07/22/2018 1111   BUN 11.5 07/19/2016 0910   CREATININE 0.79 07/22/2018 1111   CREATININE 0.8 07/19/2016 0910      Component Value  Date/Time   CALCIUM 10.6 (H) 07/22/2018 1111   CALCIUM 10.6 (H) 07/19/2016 0910   ALKPHOS 97 07/22/2018 1111   ALKPHOS 114 07/19/2016 0910   AST 24 07/22/2018 1111   AST 28 07/19/2016 0910   ALT 29 07/22/2018 1111   ALT 52 07/19/2016 0910   BILITOT 0.4 07/22/2018 1111   BILITOT 0.32 07/19/2016 0910

## 2020-07-26 NOTE — Telephone Encounter (Signed)
Scheduled appointment per 11/2 sch msg. Patient is aware of appointment

## 2020-07-26 NOTE — Assessment & Plan Note (Signed)
Overall, despite her total lymphocyte count continues to trend up, she have no symptoms I will continue to see her once a year The patient is educated to watch for signs and symptoms of disease progression

## 2020-08-29 DIAGNOSIS — H2513 Age-related nuclear cataract, bilateral: Secondary | ICD-10-CM | POA: Diagnosis not present

## 2020-09-05 DIAGNOSIS — M8580 Other specified disorders of bone density and structure, unspecified site: Secondary | ICD-10-CM | POA: Diagnosis not present

## 2020-09-05 DIAGNOSIS — E210 Primary hyperparathyroidism: Secondary | ICD-10-CM | POA: Diagnosis not present

## 2020-09-05 DIAGNOSIS — E039 Hypothyroidism, unspecified: Secondary | ICD-10-CM | POA: Diagnosis not present

## 2020-09-08 DIAGNOSIS — E039 Hypothyroidism, unspecified: Secondary | ICD-10-CM | POA: Diagnosis not present

## 2020-09-08 DIAGNOSIS — M8580 Other specified disorders of bone density and structure, unspecified site: Secondary | ICD-10-CM | POA: Diagnosis not present

## 2020-09-08 DIAGNOSIS — E210 Primary hyperparathyroidism: Secondary | ICD-10-CM | POA: Diagnosis not present

## 2020-11-08 DIAGNOSIS — D1801 Hemangioma of skin and subcutaneous tissue: Secondary | ICD-10-CM | POA: Diagnosis not present

## 2020-11-08 DIAGNOSIS — D485 Neoplasm of uncertain behavior of skin: Secondary | ICD-10-CM | POA: Diagnosis not present

## 2020-11-08 DIAGNOSIS — L820 Inflamed seborrheic keratosis: Secondary | ICD-10-CM | POA: Diagnosis not present

## 2020-11-10 DIAGNOSIS — E210 Primary hyperparathyroidism: Secondary | ICD-10-CM | POA: Diagnosis not present

## 2020-11-10 DIAGNOSIS — E039 Hypothyroidism, unspecified: Secondary | ICD-10-CM | POA: Diagnosis not present

## 2020-11-10 DIAGNOSIS — M8580 Other specified disorders of bone density and structure, unspecified site: Secondary | ICD-10-CM | POA: Diagnosis not present

## 2020-11-30 ENCOUNTER — Other Ambulatory Visit: Payer: Self-pay | Admitting: Hematology and Oncology

## 2020-11-30 DIAGNOSIS — Z1231 Encounter for screening mammogram for malignant neoplasm of breast: Secondary | ICD-10-CM

## 2021-01-09 ENCOUNTER — Ambulatory Visit
Admission: RE | Admit: 2021-01-09 | Discharge: 2021-01-09 | Disposition: A | Discharge status: Home / Self Care | Payer: PPO | Source: Ambulatory Visit | Attending: Hematology and Oncology | Admitting: Hematology and Oncology

## 2021-01-09 ENCOUNTER — Other Ambulatory Visit: Payer: Self-pay

## 2021-01-09 DIAGNOSIS — Z1231 Encounter for screening mammogram for malignant neoplasm of breast: Secondary | ICD-10-CM

## 2021-02-13 DIAGNOSIS — Z03818 Encounter for observation for suspected exposure to other biological agents ruled out: Secondary | ICD-10-CM | POA: Diagnosis not present

## 2021-02-13 DIAGNOSIS — Z20822 Contact with and (suspected) exposure to covid-19: Secondary | ICD-10-CM | POA: Diagnosis not present

## 2021-02-21 DIAGNOSIS — E7800 Pure hypercholesterolemia, unspecified: Secondary | ICD-10-CM | POA: Diagnosis not present

## 2021-02-28 DIAGNOSIS — M1990 Unspecified osteoarthritis, unspecified site: Secondary | ICD-10-CM | POA: Diagnosis not present

## 2021-02-28 DIAGNOSIS — M8580 Other specified disorders of bone density and structure, unspecified site: Secondary | ICD-10-CM | POA: Diagnosis not present

## 2021-02-28 DIAGNOSIS — Z79899 Other long term (current) drug therapy: Secondary | ICD-10-CM | POA: Diagnosis not present

## 2021-02-28 DIAGNOSIS — E7800 Pure hypercholesterolemia, unspecified: Secondary | ICD-10-CM | POA: Diagnosis not present

## 2021-02-28 DIAGNOSIS — Z0000 Encounter for general adult medical examination without abnormal findings: Secondary | ICD-10-CM | POA: Diagnosis not present

## 2021-02-28 DIAGNOSIS — Z1389 Encounter for screening for other disorder: Secondary | ICD-10-CM | POA: Diagnosis not present

## 2021-02-28 DIAGNOSIS — K219 Gastro-esophageal reflux disease without esophagitis: Secondary | ICD-10-CM | POA: Diagnosis not present

## 2021-03-02 ENCOUNTER — Other Ambulatory Visit: Payer: Self-pay | Admitting: Surgery

## 2021-03-02 ENCOUNTER — Other Ambulatory Visit: Payer: Self-pay | Admitting: Family Medicine

## 2021-03-02 DIAGNOSIS — M8588 Other specified disorders of bone density and structure, other site: Secondary | ICD-10-CM

## 2021-04-30 ENCOUNTER — Encounter (HOSPITAL_BASED_OUTPATIENT_CLINIC_OR_DEPARTMENT_OTHER): Payer: Self-pay | Admitting: Emergency Medicine

## 2021-04-30 ENCOUNTER — Other Ambulatory Visit: Payer: Self-pay

## 2021-04-30 ENCOUNTER — Emergency Department (HOSPITAL_BASED_OUTPATIENT_CLINIC_OR_DEPARTMENT_OTHER): Payer: PPO

## 2021-04-30 ENCOUNTER — Emergency Department (HOSPITAL_BASED_OUTPATIENT_CLINIC_OR_DEPARTMENT_OTHER)
Admission: EM | Admit: 2021-04-30 | Discharge: 2021-04-30 | Disposition: A | Discharge status: Home / Self Care | Payer: PPO | Attending: Emergency Medicine | Admitting: Emergency Medicine

## 2021-04-30 DIAGNOSIS — I160 Hypertensive urgency: Secondary | ICD-10-CM | POA: Insufficient documentation

## 2021-04-30 DIAGNOSIS — M25562 Pain in left knee: Secondary | ICD-10-CM | POA: Diagnosis present

## 2021-04-30 DIAGNOSIS — M79662 Pain in left lower leg: Secondary | ICD-10-CM | POA: Diagnosis not present

## 2021-04-30 DIAGNOSIS — Z79899 Other long term (current) drug therapy: Secondary | ICD-10-CM | POA: Diagnosis not present

## 2021-04-30 DIAGNOSIS — M7989 Other specified soft tissue disorders: Secondary | ICD-10-CM | POA: Diagnosis not present

## 2021-04-30 DIAGNOSIS — M7122 Synovial cyst of popliteal space [Baker], left knee: Secondary | ICD-10-CM

## 2021-04-30 DIAGNOSIS — M79605 Pain in left leg: Secondary | ICD-10-CM | POA: Insufficient documentation

## 2021-04-30 DIAGNOSIS — Z7982 Long term (current) use of aspirin: Secondary | ICD-10-CM | POA: Insufficient documentation

## 2021-04-30 DIAGNOSIS — E039 Hypothyroidism, unspecified: Secondary | ICD-10-CM | POA: Insufficient documentation

## 2021-04-30 LAB — BASIC METABOLIC PANEL
Anion gap: 7 (ref 5–15)
BUN: 15 mg/dL (ref 8–23)
CO2: 27 mmol/L (ref 22–32)
Calcium: 9.8 mg/dL (ref 8.9–10.3)
Chloride: 109 mmol/L (ref 98–111)
Creatinine, Ser: 0.54 mg/dL (ref 0.44–1.00)
GFR, Estimated: >60 mL/min (ref >60)
Glucose, Bld: 84 mg/dL (ref 70–99)
Potassium: 3.9 mmol/L (ref 3.5–5.1)
Sodium: 143 mmol/L (ref 135–145)

## 2021-04-30 LAB — CBC WITH DIFFERENTIAL/PLATELET
Abs Immature Granulocytes: 0.08 10*3/uL — ABNORMAL HIGH (ref 0.00–0.07)
Basophils Absolute: 0 10*3/uL (ref 0.0–0.1)
Basophils Relative: 0 %
Eosinophils Absolute: 0.2 10*3/uL (ref 0.0–0.5)
Eosinophils Relative: 0 %
HCT: 44.3 % (ref 36.0–46.0)
Hemoglobin: 14.2 g/dL (ref 12.0–15.0)
Immature Granulocytes: 0 %
Lymphocytes Relative: 89 %
Lymphs Abs: 43.6 10*3/uL — ABNORMAL HIGH (ref 0.7–4.0)
MCH: 30.3 pg (ref 26.0–34.0)
MCHC: 32.1 g/dL (ref 30.0–36.0)
MCV: 94.5 fL (ref 80.0–100.0)
Monocytes Absolute: 0.6 10*3/uL (ref 0.1–1.0)
Monocytes Relative: 1 %
Neutro Abs: 5 10*3/uL (ref 1.7–7.7)
Neutrophils Relative %: 10 %
Platelets: 202 10*3/uL (ref 150–400)
RBC: 4.69 MIL/uL (ref 3.87–5.11)
RDW: 13.7 % (ref 11.5–15.5)
WBC: 49.5 10*3/uL — ABNORMAL HIGH (ref 4.0–10.5)
nRBC: 0 % (ref 0.0–0.2)

## 2021-04-30 MED ORDER — AMLODIPINE BESYLATE 5 MG PO TABS
5.0000 mg | ORAL_TABLET | Freq: Once | ORAL | Status: AC
  Administered 2021-04-30: 5 mg via ORAL
  Filled 2021-04-30: qty 1

## 2021-04-30 NOTE — ED Provider Notes (Signed)
[facility] EMERGENCY DEPT Provider Note   CSN: [id number] Arrival date & time: 04/30/21  1522     History Chief Complaint  Patient presents with   Leg Pain    Kaitlin Barnes is a 70 y.o. female.  HPI 70 year old female presents with left posterior leg pain.  Started yesterday.  It is hard for her to walk due to the pain.  She is had a lot of issues with varicose veins with previous procedures.  Feeling her leg might be swollen.  No chest pain or shortness of breath.  Has not had any weakness or numbness.  Her BP is quite elevated here (99991111 systolic) and she states she does not have a diagnosis of HTN. She denies headache, numbness, weakness, chest pain or dyspnea.   Past Medical History:  Diagnosis Date   CLL (chronic lymphoblastic leukemia)    CLL (chronic lymphocytic leukemia) ([place]) 06/12/2012   Fever 03/28/2016   Hypercalcemia    Hyperparathyroidism, unspecified ([place])    Hypothyroidism    Lung nodule 07/16/2013    Patient Active Problem List   Diagnosis Date Noted   Skin mole 07/26/2020   Elevated BP without diagnosis of hypertension 07/28/2019   Hypercalcemia 07/19/2016   Pain in left lower leg 04/02/2016   Elevated liver enzymes 04/02/2016   Fever 03/28/2016   Abnormal mammogram 07/20/2014   Lung nodule 07/16/2013   CLL (chronic lymphocytic leukemia) ([place]) 06/12/2012    Past Surgical History:  Procedure Laterality Date   BREAST BIOPSY Left    benign   THYROIDECTOMY       OB History   No obstetric history on file.     Family History  Problem Relation Age of Onset   Cancer Sister 20       breast ca   Cancer Brother 11       prostate Ca    Social History   Tobacco Use   Smoking status: Never   Smokeless tobacco: Never  Substance Use Topics   Alcohol use: No   Drug use: No    Home Medications Prior to Admission medications   Medication Sig Start Date End Date Taking? Authorizing Provider  aspirin EC 81 MG tablet Take 81 mg by  mouth daily. Swallow whole.    [provider]  BIOTIN PO Take by mouth daily.    [provider]  cholecalciferol (VITAMIN D) 1000 UNITS tablet Take 1,000 Units by mouth daily. 2,000 units    [provider]  levothyroxine (SYNTHROID) 75 MCG tablet Take 75 mcg by mouth daily. 06/01/20   [provider]  Multiple Vitamin (MULTIVITAMIN) tablet Take 1 tablet by mouth daily.    [provider]  omeprazole (PRILOSEC) 20 MG capsule Take 20 mg by mouth daily.     [provider]    Allergies    Patient has no known allergies.  Review of Systems   Review of Systems  Respiratory:  Negative for shortness of breath.   Cardiovascular:  Positive for leg swelling. Negative for chest pain.  Genitourinary:  Negative for decreased urine volume and difficulty urinating.  Musculoskeletal:  Positive for myalgias.  Neurological:  Negative for weakness, numbness and headaches.  All other systems reviewed and are negative.  Physical Exam Updated Vital Signs BP (!) 161/83   Pulse 74   Temp 98.1 F (36.7 C) (Oral)   Resp 18   SpO2 98%   Physical Exam Vitals and nursing note reviewed.  Constitutional:  General: She is not in acute distress.    Appearance: She is well-developed. She is not ill-appearing or diaphoretic.  HENT:     Head: Normocephalic and atraumatic.     Right Ear: External ear normal.     Left Ear: External ear normal.     Nose: Nose normal.  Eyes:     General:        Right eye: No discharge.        Left eye: No discharge.  Cardiovascular:     Rate and Rhythm: Normal rate and regular rhythm.     Pulses:          Dorsalis pedis pulses are 2+ on the left side.  Pulmonary:     Effort: Pulmonary effort is normal.  Abdominal:     General: There is no distension.  Musculoskeletal:     Left upper leg: No swelling or tenderness.     Left knee: No swelling or bony tenderness. Normal range of motion. Tenderness (posterior, mild)  present.     Left lower leg: No swelling or deformity.  Skin:    General: Skin is warm and dry.  Neurological:     Mental Status: She is alert.  Psychiatric:        Mood and Affect: Mood is not anxious.    ED Results / Procedures / Treatments   Labs (all labs ordered are listed, but only abnormal results are displayed) Labs Reviewed  CBC WITH DIFFERENTIAL/PLATELET - Abnormal; Notable for the following components:      Result Value   WBC 49.5 (*)    Lymphs Abs 43.6 (*)    Abs Immature Granulocytes 0.08 (*)    All other components within normal limits  BASIC METABOLIC PANEL    EKG None  Radiology US Venous Img Lower  Left (DVT Study)  Result Date: 04/30/2021 CLINICAL DATA:  Leg swelling and pain, left ankle and calf pain for 2 days history of varicose vein stripping, unable to bear weight EXAM: LEFT LOWER EXTREMITY VENOUS DOPPLER ULTRASOUND TECHNIQUE: Gray-scale sonography with compression, as well as color and duplex ultrasound, were performed to evaluate the deep venous system(s) from the level of the common femoral vein through the popliteal and proximal calf veins. COMPARISON:  None. FINDINGS: VENOUS Normal compressibility of the common femoral, superficial femoral, and popliteal veins, as well as the visualized calf veins. Visualized portions of profunda femoral vein and great saphenous vein unremarkable. No filling defects to suggest DVT on grayscale or color Doppler imaging. Doppler waveforms show normal direction of venous flow, normal respiratory plasticity and response to augmentation. Limited views of the contralateral common femoral vein are unremarkable. OTHER There is a small fluid collection in the medial popliteal fossa, characteristic in location and appearance Baker's cyst measuring 2.0 cm. Limitations: none IMPRESSION: 1. Negative examination for deep venous thrombosis in the left lower extremity. 2. Small Baker's cyst in the medial popliteal fossa. Electronically Signed    By: Eddie Candle M.D.   On: 04/30/2021 17:01    Procedures Procedures   Medications Ordered in ED Medications  amLODipine (NORVASC) tablet 5 mg (5 mg Oral Given 04/30/21 1746)    ED Course  I have reviewed the triage vital signs and the nursing notes.  Pertinent labs & imaging results that were available during my care of the patient were reviewed by me and considered in my medical decision making (see chart for details).    MDM Rules/Calculators/A&P  Patient's leg pain appears to be due to the Baker's cyst seen on DVT ultrasound.  No obvious cellulitis/infection.  No redness to the site.  No systemic symptoms.  She is noted to be quite hypertensive and she states that is never happened before except in very stressful situations.  We had a discussion and so we decided to do some labs and then give a dose of amlodipine.  I plan to send her home on this but she would like to wait and keep rechecking her blood pressure and follow-up with her doctor.  I do not think this is unreasonable.  Her WBC appears stable compared to prior CLL numbers.  Discharged home with return precautions.  No signs/symptoms of endorgan damage of hypertension. Final Clinical Impression(s) / ED Diagnoses Final diagnoses:  Baker's cyst of knee, left  Hypertensive urgency    Rx / DC Orders ED Discharge Orders     None        Sherwood Gambler, MD 04/30/21 2328

## 2021-04-30 NOTE — ED Triage Notes (Addendum)
Pt presents to ED Pov. Pt c/o L leg pain. Worsens w/ walking. Pt reports that she has had isues with varicose veins for years but yesterday when she woke up pain increased and she was unable to bear full weight. Pt ambulatory w/ cane to triage. Sent here from [facility] for DVT r/o.

## 2021-05-03 DIAGNOSIS — M1712 Unilateral primary osteoarthritis, left knee: Secondary | ICD-10-CM | POA: Diagnosis not present

## 2021-05-03 DIAGNOSIS — M7122 Synovial cyst of popliteal space [Baker], left knee: Secondary | ICD-10-CM | POA: Diagnosis not present

## 2021-05-30 DIAGNOSIS — M25562 Pain in left knee: Secondary | ICD-10-CM | POA: Diagnosis not present

## 2021-05-30 DIAGNOSIS — M1712 Unilateral primary osteoarthritis, left knee: Secondary | ICD-10-CM | POA: Diagnosis not present

## 2021-05-30 DIAGNOSIS — M23204 Derangement of unspecified medial meniscus due to old tear or injury, left knee: Secondary | ICD-10-CM | POA: Diagnosis not present

## 2021-05-30 DIAGNOSIS — M7122 Synovial cyst of popliteal space [Baker], left knee: Secondary | ICD-10-CM | POA: Diagnosis not present

## 2021-06-11 DIAGNOSIS — M25562 Pain in left knee: Secondary | ICD-10-CM | POA: Diagnosis not present

## 2021-06-26 DIAGNOSIS — M25562 Pain in left knee: Secondary | ICD-10-CM | POA: Diagnosis not present

## 2021-07-20 DIAGNOSIS — X58XXXA Exposure to other specified factors, initial encounter: Secondary | ICD-10-CM | POA: Diagnosis not present

## 2021-07-20 DIAGNOSIS — S83232A Complex tear of medial meniscus, current injury, left knee, initial encounter: Secondary | ICD-10-CM | POA: Diagnosis not present

## 2021-07-20 DIAGNOSIS — G8918 Other acute postprocedural pain: Secondary | ICD-10-CM | POA: Diagnosis not present

## 2021-07-20 DIAGNOSIS — M94262 Chondromalacia, left knee: Secondary | ICD-10-CM | POA: Diagnosis not present

## 2021-07-20 DIAGNOSIS — Y999 Unspecified external cause status: Secondary | ICD-10-CM | POA: Diagnosis not present

## 2021-07-25 ENCOUNTER — Other Ambulatory Visit: Payer: PPO

## 2021-07-25 ENCOUNTER — Ambulatory Visit: Payer: PPO | Admitting: Hematology and Oncology

## 2021-07-28 DIAGNOSIS — M25562 Pain in left knee: Secondary | ICD-10-CM | POA: Diagnosis not present

## 2021-07-31 DIAGNOSIS — M25562 Pain in left knee: Secondary | ICD-10-CM | POA: Diagnosis not present

## 2021-08-04 DIAGNOSIS — M25562 Pain in left knee: Secondary | ICD-10-CM | POA: Diagnosis not present

## 2021-08-08 DIAGNOSIS — M25562 Pain in left knee: Secondary | ICD-10-CM | POA: Diagnosis not present

## 2021-08-11 DIAGNOSIS — M25562 Pain in left knee: Secondary | ICD-10-CM | POA: Diagnosis not present

## 2021-08-14 DIAGNOSIS — M25562 Pain in left knee: Secondary | ICD-10-CM | POA: Diagnosis not present

## 2021-08-16 DIAGNOSIS — M25562 Pain in left knee: Secondary | ICD-10-CM | POA: Diagnosis not present

## 2021-08-24 ENCOUNTER — Other Ambulatory Visit: Payer: Self-pay

## 2021-08-24 ENCOUNTER — Inpatient Hospital Stay (HOSPITAL_BASED_OUTPATIENT_CLINIC_OR_DEPARTMENT_OTHER): Payer: PPO | Admitting: Hematology and Oncology

## 2021-08-24 ENCOUNTER — Inpatient Hospital Stay: Payer: PPO | Attending: Hematology and Oncology

## 2021-08-24 ENCOUNTER — Encounter: Payer: Self-pay | Admitting: Hematology and Oncology

## 2021-08-24 DIAGNOSIS — C9110 Chronic lymphocytic leukemia of B-cell type not having achieved remission: Secondary | ICD-10-CM | POA: Diagnosis present

## 2021-08-24 DIAGNOSIS — R030 Elevated blood-pressure reading, without diagnosis of hypertension: Secondary | ICD-10-CM | POA: Diagnosis not present

## 2021-08-24 DIAGNOSIS — F419 Anxiety disorder, unspecified: Secondary | ICD-10-CM | POA: Diagnosis not present

## 2021-08-24 LAB — CBC WITH DIFFERENTIAL/PLATELET
Abs Immature Granulocytes: 0.08 10*3/uL — ABNORMAL HIGH (ref 0.00–0.07)
Basophils Absolute: 0.2 10*3/uL — ABNORMAL HIGH (ref 0.0–0.1)
Basophils Relative: 0 %
Eosinophils Absolute: 0.4 10*3/uL (ref 0.0–0.5)
Eosinophils Relative: 1 %
HCT: 43.4 % (ref 36.0–46.0)
Hemoglobin: 13.8 g/dL (ref 12.0–15.0)
Immature Granulocytes: 0 %
Lymphocytes Relative: 86 %
Lymphs Abs: 35.2 10*3/uL — ABNORMAL HIGH (ref 0.7–4.0)
MCH: 30.3 pg (ref 26.0–34.0)
MCHC: 31.8 g/dL (ref 30.0–36.0)
MCV: 95.2 fL (ref 80.0–100.0)
Monocytes Absolute: 0.5 10*3/uL (ref 0.1–1.0)
Monocytes Relative: 1 %
Neutro Abs: 4.8 10*3/uL (ref 1.7–7.7)
Neutrophils Relative %: 12 %
Platelets: 227 10*3/uL (ref 150–400)
RBC: 4.56 MIL/uL (ref 3.87–5.11)
RDW: 14.1 % (ref 11.5–15.5)
Smear Review: NORMAL
WBC: 41.2 10*3/uL — ABNORMAL HIGH (ref 4.0–10.5)
nRBC: 0 % (ref 0.0–0.2)

## 2021-08-24 NOTE — Assessment & Plan Note (Signed)
Over the last 3 years, her absolute lymphocyte count is well over 30,000 and total white blood cell count is in the 40,000 range but she remained asymptomatic I will continue to see her once a year The patient is educated to watch for signs and symptoms of disease progression

## 2021-08-24 NOTE — Assessment & Plan Note (Signed)
Her blood pressure is mildly elevated today but could be due to anxiety Observe closely for now

## 2021-08-24 NOTE — Progress Notes (Signed)
[facility] OFFICE PROGRESS NOTE  Patient Care Team: Lawerance Cruel, MD as PCP - General (Family Medicine) Delrae Rend, MD as Attending Physician (Internal Medicine)  ASSESSMENT & PLAN:  CLL (chronic lymphocytic leukemia) Over the last 3 years, her absolute lymphocyte count is well over 30,000 and total white blood cell count is in the 40,000 range but she remained asymptomatic I will continue to see her once a year The patient is educated to watch for signs and symptoms of disease progression  Elevated BP without diagnosis of hypertension Her blood pressure is mildly elevated today but could be due to anxiety Observe closely for now  No orders of the defined types were placed in this encounter.   All questions were answered. The patient knows to call the clinic with any problems, questions or concerns. The total time spent in the appointment was 20 minutes encounter with patients including review of chart and various tests results, discussions about plan of care and coordination of care plan   [name], MD 08/24/2021 9:44 AM  INTERVAL HISTORY: Please see below for problem oriented charting. she returns for surveillance follow-up for CLL She denies new lymphadenopathy or recent infection She had recent left knee arthroscopy procedure with improvement of her knee pain She is undergoing physical therapy for that Appetite is fair  REVIEW OF SYSTEMS:   Constitutional: Denies fevers, chills or abnormal weight loss Eyes: Denies blurriness of vision Ears, nose, mouth, throat, and face: Denies mucositis or sore throat Respiratory: Denies cough, dyspnea or wheezes Cardiovascular: Denies palpitation, chest discomfort or lower extremity swelling Gastrointestinal:  Denies nausea, heartburn or change in bowel habits Skin: Denies abnormal skin rashes Lymphatics: Denies new lymphadenopathy or easy bruising Neurological:Denies numbness, tingling or new  weaknesses Behavioral/Psych: Mood is stable, no new changes  All other systems were reviewed with the patient and are negative.  I have reviewed the past medical history, past surgical history, social history and family history with the patient and they are unchanged from previous note.  ALLERGIES:  has No Known Allergies.  MEDICATIONS:  Current Outpatient Medications  Medication Sig Dispense Refill   aspirin EC 81 MG tablet Take 81 mg by mouth daily. Swallow whole.     BIOTIN PO Take by mouth daily.     cholecalciferol (VITAMIN D) 1000 UNITS tablet Take 1,000 Units by mouth daily. 2,000 units     levothyroxine (SYNTHROID) 75 MCG tablet Take 75 mcg by mouth daily.     Multiple Vitamin (MULTIVITAMIN) tablet Take 1 tablet by mouth daily.     omeprazole (PRILOSEC) 20 MG capsule Take 20 mg by mouth daily.      No current facility-administered medications for this visit.    SUMMARY OF ONCOLOGIC HISTORY: Oncology History  CLL (chronic lymphocytic leukemia) ([place])  06/12/2012 Initial Diagnosis   CLL (chronic lymphocytic leukemia)   07/20/2014 Pathology Results   FISH was normal   08/24/2021 Cancer Staging   Staging form: Chronic Lymphocytic Leukemia / Small Lymphocytic Lymphoma, AJCC 8th Edition - Clinical stage from 08/24/2021: Modified Rai Stage 0 (Modified Rai risk: Low, Lymphocytosis: Present, Adenopathy: Absent, Organomegaly: Absent, Anemia: Absent, Thrombocytopenia: Absent) - Signed by [name], MD on 08/24/2021 Stage prefix: Initial diagnosis      PHYSICAL EXAMINATION: ECOG PERFORMANCE STATUS: 1 - Symptomatic but completely ambulatory  Vitals:   08/24/21 0918  BP: (!) 157/82  Pulse: 83  Resp: 18  Temp: (!) 97.5 F (36.4 C)  SpO2: 98%   Filed Weights  08/24/21 0918  Weight: 169 lb 9.6 oz (76.9 kg)    GENERAL:alert, no distress and comfortable SKIN: skin color, texture, turgor are normal, no rashes or significant lesions EYES: normal, Conjunctiva are pink and  non-injected, sclera clear OROPHARYNX:no exudate, no erythema and lips, buccal mucosa, and tongue normal  NECK: supple, thyroid normal size, non-tender, without nodularity LYMPH:  no palpable lymphadenopathy in the cervical, axillary or inguinal LUNGS: clear to auscultation and percussion with normal breathing effort HEART: regular rate & rhythm and no murmurs and no lower extremity edema ABDOMEN:abdomen soft, non-tender and normal bowel sounds Musculoskeletal:no cyanosis of digits and no clubbing  NEURO: alert & oriented x 3 with fluent speech, no focal motor/sensory deficits  LABORATORY DATA:  I have reviewed the data as listed    Component Value Date/Time   NA 143 04/30/2021 1809   NA 143 07/19/2016 0910   K 3.9 04/30/2021 1809   K 4.6 07/19/2016 0910   CL 109 04/30/2021 1809   CL 104 12/11/2012 1408   CO2 27 04/30/2021 1809   CO2 28 07/19/2016 0910   GLUCOSE 84 04/30/2021 1809   GLUCOSE 115 07/19/2016 0910   GLUCOSE 94 12/11/2012 1408   BUN 15 04/30/2021 1809   BUN 11.5 07/19/2016 0910   CREATININE 0.54 04/30/2021 1809   CREATININE 0.8 07/19/2016 0910   CALCIUM 9.8 04/30/2021 1809   CALCIUM 10.6 (H) 07/19/2016 0910   PROT 6.6 07/22/2018 1111   PROT 6.9 07/19/2016 0910   ALBUMIN 3.9 07/22/2018 1111   ALBUMIN 3.8 07/19/2016 0910   AST 24 07/22/2018 1111   AST 28 07/19/2016 0910   ALT 29 07/22/2018 1111   ALT 52 07/19/2016 0910   ALKPHOS 97 07/22/2018 1111   ALKPHOS 114 07/19/2016 0910   BILITOT 0.4 07/22/2018 1111   BILITOT 0.32 07/19/2016 0910   GFRNONAA >60 04/30/2021 1809   GFRAA >60 07/22/2018 1111    No results found for: SPEP, UPEP  Lab Results  Component Value Date   WBC 41.2 (H) 08/24/2021   NEUTROABS 4.8 08/24/2021   HGB 13.8 08/24/2021   HCT 43.4 08/24/2021   MCV 95.2 08/24/2021   PLT 227 08/24/2021      Chemistry      Component Value Date/Time   NA 143 04/30/2021 1809   NA 143 07/19/2016 0910   K 3.9 04/30/2021 1809   K 4.6 07/19/2016 0910    CL 109 04/30/2021 1809   CL 104 12/11/2012 1408   CO2 27 04/30/2021 1809   CO2 28 07/19/2016 0910   BUN 15 04/30/2021 1809   BUN 11.5 07/19/2016 0910   CREATININE 0.54 04/30/2021 1809   CREATININE 0.8 07/19/2016 0910      Component Value Date/Time   CALCIUM 9.8 04/30/2021 1809   CALCIUM 10.6 (H) 07/19/2016 0910   ALKPHOS 97 07/22/2018 1111   ALKPHOS 114 07/19/2016 0910   AST 24 07/22/2018 1111   AST 28 07/19/2016 0910   ALT 29 07/22/2018 1111   ALT 52 07/19/2016 0910   BILITOT 0.4 07/22/2018 1111   BILITOT 0.32 07/19/2016 0910

## 2021-08-25 DIAGNOSIS — M25562 Pain in left knee: Secondary | ICD-10-CM | POA: Diagnosis not present

## 2021-08-30 ENCOUNTER — Ambulatory Visit
Admission: RE | Admit: 2021-08-30 | Discharge: 2021-08-30 | Disposition: A | Discharge status: Home / Self Care | Payer: PPO | Source: Ambulatory Visit | Attending: Family Medicine | Admitting: Family Medicine

## 2021-08-30 DIAGNOSIS — M85852 Other specified disorders of bone density and structure, left thigh: Secondary | ICD-10-CM | POA: Diagnosis not present

## 2021-08-30 DIAGNOSIS — M8588 Other specified disorders of bone density and structure, other site: Secondary | ICD-10-CM

## 2021-08-30 DIAGNOSIS — Z780 Asymptomatic menopausal state: Secondary | ICD-10-CM | POA: Diagnosis not present

## 2021-09-04 DIAGNOSIS — H2513 Age-related nuclear cataract, bilateral: Secondary | ICD-10-CM | POA: Diagnosis not present

## 2021-09-04 DIAGNOSIS — E039 Hypothyroidism, unspecified: Secondary | ICD-10-CM | POA: Diagnosis not present

## 2021-09-07 DIAGNOSIS — M8580 Other specified disorders of bone density and structure, unspecified site: Secondary | ICD-10-CM | POA: Diagnosis not present

## 2021-09-07 DIAGNOSIS — E039 Hypothyroidism, unspecified: Secondary | ICD-10-CM | POA: Diagnosis not present

## 2021-09-07 DIAGNOSIS — E785 Hyperlipidemia, unspecified: Secondary | ICD-10-CM | POA: Diagnosis not present

## 2021-09-07 DIAGNOSIS — E210 Primary hyperparathyroidism: Secondary | ICD-10-CM | POA: Diagnosis not present

## 2021-11-09 DIAGNOSIS — E039 Hypothyroidism, unspecified: Secondary | ICD-10-CM | POA: Diagnosis not present

## 2021-12-26 ENCOUNTER — Other Ambulatory Visit: Payer: Self-pay | Admitting: Hematology and Oncology

## 2021-12-26 DIAGNOSIS — Z1231 Encounter for screening mammogram for malignant neoplasm of breast: Secondary | ICD-10-CM

## 2022-01-10 ENCOUNTER — Ambulatory Visit
Admission: RE | Admit: 2022-01-10 | Discharge: 2022-01-10 | Disposition: A | Discharge status: Home / Self Care | Payer: PPO | Source: Ambulatory Visit | Attending: Hematology and Oncology | Admitting: Hematology and Oncology

## 2022-01-10 DIAGNOSIS — Z1231 Encounter for screening mammogram for malignant neoplasm of breast: Secondary | ICD-10-CM

## 2022-08-24 ENCOUNTER — Inpatient Hospital Stay (HOSPITAL_BASED_OUTPATIENT_CLINIC_OR_DEPARTMENT_OTHER): Payer: PPO | Admitting: Hematology and Oncology

## 2022-08-24 ENCOUNTER — Encounter: Payer: Self-pay | Admitting: Hematology and Oncology

## 2022-08-24 ENCOUNTER — Inpatient Hospital Stay: Payer: PPO | Attending: Hematology and Oncology

## 2022-08-24 ENCOUNTER — Other Ambulatory Visit: Payer: Self-pay

## 2022-08-24 VITALS — BP 159/71 | HR 77 | Temp 97.7°F | Resp 18 | Ht 63.0 in | Wt 172.6 lb

## 2022-08-24 DIAGNOSIS — Z803 Family history of malignant neoplasm of breast: Secondary | ICD-10-CM | POA: Insufficient documentation

## 2022-08-24 DIAGNOSIS — C9110 Chronic lymphocytic leukemia of B-cell type not having achieved remission: Secondary | ICD-10-CM

## 2022-08-24 LAB — CBC WITH DIFFERENTIAL/PLATELET
Abs Immature Granulocytes: 0.05 10*3/uL (ref 0.00–0.07)
Basophils Absolute: 0 10*3/uL (ref 0.0–0.1)
Basophils Relative: 0 %
Eosinophils Absolute: 0.3 10*3/uL (ref 0.0–0.5)
Eosinophils Relative: 1 %
HCT: 40.4 % (ref 36.0–46.0)
Hemoglobin: 13.2 g/dL (ref 12.0–15.0)
Immature Granulocytes: 0 %
Lymphocytes Relative: 89 %
Lymphs Abs: 42.4 10*3/uL — ABNORMAL HIGH (ref 0.7–4.0)
MCH: 30.9 pg (ref 26.0–34.0)
MCHC: 32.7 g/dL (ref 30.0–36.0)
MCV: 94.6 fL (ref 80.0–100.0)
Monocytes Absolute: 0.5 10*3/uL (ref 0.1–1.0)
Monocytes Relative: 1 %
Neutro Abs: 4.1 10*3/uL (ref 1.7–7.7)
Neutrophils Relative %: 9 %
Platelets: 208 10*3/uL (ref 150–400)
RBC: 4.27 MIL/uL (ref 3.87–5.11)
RDW: 13.5 % (ref 11.5–15.5)
Smear Review: NORMAL
WBC: 47.2 10*3/uL — ABNORMAL HIGH (ref 4.0–10.5)
nRBC: 0 % (ref 0.0–0.2)

## 2022-08-24 NOTE — Progress Notes (Signed)
[facility] OFFICE PROGRESS NOTE  Patient Care Team: Lawerance Cruel, MD as PCP - General (Family Medicine) Delrae Rend, MD as Attending Physician (Internal Medicine)  ASSESSMENT & PLAN:  CLL (chronic lymphocytic leukemia) She has no palpable lymphadenopathy in the left axilla measuring approximately 2-1/2 cm in size that was not picked up on recent mammogram, prior imaging This is new compared to previous exam I was not able to palpate discrete lymph node on the right axilla although fullness is noted and she has felt somewhat uncomfortable CBC did not show any worsening counts However, given positive family history of breast cancer as well as her symptoms, I recommend CT imaging to stage CLL and she is in agreement  Orders Placed This Encounter  Procedures   CT CHEST ABDOMEN PELVIS W CONTRAST    Standing Status:   Future    Standing Expiration Date:   08/25/2023    Order Specific Question:   Preferred imaging location?    Answer:   [facility]    Order Specific Question:   Radiology Contrast Protocol - do NOT remove file path    Answer:   [email or web address]    All questions were answered. The patient knows to call the clinic with any problems, questions or concerns. The total time spent in the appointment was 25 minutes encounter with patients including review of chart and various tests results, discussions about plan of care and coordination of care plan   [name], MD 08/24/2022 10:08 AM  INTERVAL HISTORY: Please see below for problem oriented charting. she returns for treatment follow-up She has noted some mild discomfort on the right axilla but no other lymphadenopathy Denies recent infection  REVIEW OF SYSTEMS:   Constitutional: Denies fevers, chills or abnormal weight loss Eyes: Denies blurriness of vision Ears, nose, mouth, throat, and face: Denies mucositis or sore throat Respiratory: Denies cough,  dyspnea or wheezes Cardiovascular: Denies palpitation, chest discomfort or lower extremity swelling Gastrointestinal:  Denies nausea, heartburn or change in bowel habits Skin: Denies abnormal skin rashes Neurological:Denies numbness, tingling or new weaknesses Behavioral/Psych: Mood is stable, no new changes  All other systems were reviewed with the patient and are negative.  I have reviewed the past medical history, past surgical history, social history and family history with the patient and they are unchanged from previous note.  ALLERGIES:  has No Known Allergies.  MEDICATIONS:  Current Outpatient Medications  Medication Sig Dispense Refill   aspirin EC 81 MG tablet Take 81 mg by mouth daily. Swallow whole.     cholecalciferol (VITAMIN D) 1000 UNITS tablet Take 2,000 Units by mouth daily.     levothyroxine (SYNTHROID) 75 MCG tablet Take 75 mcg by mouth daily.     Multiple Vitamin (MULTIVITAMIN) tablet Take 1 tablet by mouth daily.     omeprazole (PRILOSEC) 20 MG capsule Take 20 mg by mouth daily.      No current facility-administered medications for this visit.    SUMMARY OF ONCOLOGIC HISTORY: Oncology History  CLL (chronic lymphocytic leukemia) ([place])  06/12/2012 Initial Diagnosis   CLL (chronic lymphocytic leukemia)   07/20/2014 Pathology Results   FISH was normal   08/24/2021 Cancer Staging   Staging form: Chronic Lymphocytic Leukemia / Small Lymphocytic Lymphoma, AJCC 8th Edition - Clinical stage from 08/24/2021: Modified Rai Stage 0 (Modified Rai risk: Low, Lymphocytosis: Present, Adenopathy: Absent, Organomegaly: Absent, Anemia: Absent, Thrombocytopenia: Absent) - Signed by [name], MD on 08/24/2021 Stage prefix: Initial diagnosis  PHYSICAL EXAMINATION: ECOG PERFORMANCE STATUS: 1 - Symptomatic but completely ambulatory  Vitals:   08/24/22 0942  BP: (!) 159/71  Pulse: 77  Resp: 18  Temp: 97.7 F (36.5 C)  SpO2: 99%   Filed Weights   08/24/22 0942   Weight: 172 lb 9.6 oz (78.3 kg)    GENERAL:alert, no distress and comfortable SKIN: skin color, texture, turgor are normal, no rashes or significant lesions EYES: normal, Conjunctiva are pink and non-injected, sclera clear OROPHARYNX:no exudate, no erythema and lips, buccal mucosa, and tongue normal  NECK: supple, thyroid normal size, non-tender, without nodularity LYMPH: She has discrete palpable lymphadenopathy in the left axilla LUNGS: clear to auscultation and percussion with normal breathing effort HEART: regular rate & rhythm and no murmurs and no lower extremity edema ABDOMEN:abdomen soft, non-tender and normal bowel sounds Musculoskeletal:no cyanosis of digits and no clubbing  NEURO: alert & oriented x 3 with fluent speech, no focal motor/sensory deficits  LABORATORY DATA:  I have reviewed the data as listed    Component Value Date/Time   NA 143 04/30/2021 1809   NA 143 07/19/2016 0910   K 3.9 04/30/2021 1809   K 4.6 07/19/2016 0910   CL 109 04/30/2021 1809   CL 104 12/11/2012 1408   CO2 27 04/30/2021 1809   CO2 28 07/19/2016 0910   GLUCOSE 84 04/30/2021 1809   GLUCOSE 115 07/19/2016 0910   GLUCOSE 94 12/11/2012 1408   BUN 15 04/30/2021 1809   BUN 11.5 07/19/2016 0910   CREATININE 0.54 04/30/2021 1809   CREATININE 0.8 07/19/2016 0910   CALCIUM 9.8 04/30/2021 1809   CALCIUM 10.6 (H) 07/19/2016 0910   PROT 6.6 07/22/2018 1111   PROT 6.9 07/19/2016 0910   ALBUMIN 3.9 07/22/2018 1111   ALBUMIN 3.8 07/19/2016 0910   AST 24 07/22/2018 1111   AST 28 07/19/2016 0910   ALT 29 07/22/2018 1111   ALT 52 07/19/2016 0910   ALKPHOS 97 07/22/2018 1111   ALKPHOS 114 07/19/2016 0910   BILITOT 0.4 07/22/2018 1111   BILITOT 0.32 07/19/2016 0910   GFRNONAA >60 04/30/2021 1809   GFRAA >60 07/22/2018 1111    No results found for: "SPEP", "UPEP"  Lab Results  Component Value Date   WBC 47.2 (H) 08/24/2022   NEUTROABS PENDING 08/24/2022   HGB 13.2 08/24/2022   HCT 40.4  08/24/2022   MCV 94.6 08/24/2022   PLT 208 08/24/2022      Chemistry      Component Value Date/Time   NA 143 04/30/2021 1809   NA 143 07/19/2016 0910   K 3.9 04/30/2021 1809   K 4.6 07/19/2016 0910   CL 109 04/30/2021 1809   CL 104 12/11/2012 1408   CO2 27 04/30/2021 1809   CO2 28 07/19/2016 0910   BUN 15 04/30/2021 1809   BUN 11.5 07/19/2016 0910   CREATININE 0.54 04/30/2021 1809   CREATININE 0.8 07/19/2016 0910      Component Value Date/Time   CALCIUM 9.8 04/30/2021 1809   CALCIUM 10.6 (H) 07/19/2016 0910   ALKPHOS 97 07/22/2018 1111   ALKPHOS 114 07/19/2016 0910   AST 24 07/22/2018 1111   AST 28 07/19/2016 0910   ALT 29 07/22/2018 1111   ALT 52 07/19/2016 0910   BILITOT 0.4 07/22/2018 1111   BILITOT 0.32 07/19/2016 0910

## 2022-08-24 NOTE — Assessment & Plan Note (Signed)
She has no palpable lymphadenopathy in the left axilla measuring approximately 2-1/2 cm in size that was not picked up on recent mammogram, prior imaging This is new compared to previous exam I was not able to palpate discrete lymph node on the right axilla although fullness is noted and she has felt somewhat uncomfortable CBC did not show any worsening counts However, given positive family history of breast cancer as well as her symptoms, I recommend CT imaging to stage CLL and she is in agreement

## 2022-08-30 ENCOUNTER — Ambulatory Visit (HOSPITAL_BASED_OUTPATIENT_CLINIC_OR_DEPARTMENT_OTHER)
Admission: RE | Admit: 2022-08-30 | Discharge: 2022-08-30 | Disposition: A | Discharge status: Home / Self Care | Payer: PPO | Source: Ambulatory Visit | Attending: Hematology and Oncology | Admitting: Hematology and Oncology

## 2022-08-30 DIAGNOSIS — C9110 Chronic lymphocytic leukemia of B-cell type not having achieved remission: Secondary | ICD-10-CM | POA: Diagnosis present

## 2022-08-30 LAB — POCT I-STAT CREATININE: Creatinine, Ser: 0.8 mg/dL (ref 0.44–1.00)

## 2022-08-30 MED ORDER — IOHEXOL 300 MG/ML  SOLN
80.0000 mL | Freq: Once | INTRAMUSCULAR | Status: AC | PRN
  Administered 2022-08-30: 80 mL via INTRAVENOUS

## 2022-09-03 ENCOUNTER — Encounter: Payer: Self-pay | Admitting: Hematology and Oncology

## 2022-09-03 ENCOUNTER — Inpatient Hospital Stay (HOSPITAL_BASED_OUTPATIENT_CLINIC_OR_DEPARTMENT_OTHER): Payer: PPO | Admitting: Hematology and Oncology

## 2022-09-03 VITALS — BP 190/81 | HR 80 | Temp 98.3°F | Resp 17 | Wt 172.8 lb

## 2022-09-03 DIAGNOSIS — N200 Calculus of kidney: Secondary | ICD-10-CM

## 2022-09-03 DIAGNOSIS — C9110 Chronic lymphocytic leukemia of B-cell type not having achieved remission: Secondary | ICD-10-CM | POA: Diagnosis not present

## 2022-09-03 DIAGNOSIS — E041 Nontoxic single thyroid nodule: Secondary | ICD-10-CM | POA: Insufficient documentation

## 2022-09-03 DIAGNOSIS — R030 Elevated blood-pressure reading, without diagnosis of hypertension: Secondary | ICD-10-CM | POA: Diagnosis not present

## 2022-09-03 DIAGNOSIS — K120 Recurrent oral aphthae: Secondary | ICD-10-CM

## 2022-09-03 NOTE — Assessment & Plan Note (Signed)
She has asymptomatic kidney stones We discussed the role of hydration and observation

## 2022-09-03 NOTE — Assessment & Plan Note (Signed)
Likely triggered by anxiety She will continue close observation and follow-up with primary care doctor

## 2022-09-03 NOTE — Assessment & Plan Note (Signed)
She is not symptomatic from the right thyroid nodule She follows with endocrinologist with acquired hypothyroidism I will forward results to her endocrinologist and recommend the patient to make an appointment to see her endocrinologist to discuss

## 2022-09-03 NOTE — Progress Notes (Signed)
[place] OFFICE PROGRESS NOTE  Patient Care Team: Lawerance Cruel, MD as PCP - General (Family Medicine) Delrae Rend, MD as Attending Physician (Internal Medicine)  ASSESSMENT & PLAN:  CLL (chronic lymphocytic leukemia) I have reviewed CT imaging with the patient We discussed the role of thyroid ultrasound She had remote history of left thyroidectomy and follows with endocrinologist The palpable lymphadenopathy from last week is revealed on CT as slightly enlarged lymph node in the left axilla We discussed the risk and benefits of biopsy At this point in time, I recommend observation I plan to see her again in 6 months for further follow-up We discussed the natural history of CLL There is no indication for treatment at this point in time  Right thyroid nodule She is not symptomatic from the right thyroid nodule She follows with endocrinologist with acquired hypothyroidism I will forward results to her endocrinologist and recommend the patient to make an appointment to see her endocrinologist to discuss  Bilateral kidney stones She has asymptomatic kidney stones We discussed the role of hydration and observation  Elevated BP without diagnosis of hypertension Likely triggered by anxiety She will continue close observation and follow-up with primary care doctor  Canker sore She has a canker sore at the roof of her mouth I recommend conservative approach with salt water gargle  No orders of the defined types were placed in this encounter.   All questions were answered. The patient knows to call the clinic with any problems, questions or concerns. The total time spent in the appointment was 30 minutes encounter with patients including review of chart and various tests results, discussions about plan of care and coordination of care plan   [name], MD 09/03/2022 9:46 AM  INTERVAL HISTORY: Please see below for problem oriented charting. she returns for  review of test results She is here accompanied by her husband We spent majority of our time reviewing test results She has not canker sore at the roof of the mouth that have not healed for 2 weeks  REVIEW OF SYSTEMS:   Constitutional: Denies fevers, chills or abnormal weight loss Eyes: Denies blurriness of vision Respiratory: Denies cough, dyspnea or wheezes Cardiovascular: Denies palpitation, chest discomfort or lower extremity swelling Gastrointestinal:  Denies nausea, heartburn or change in bowel habits Skin: Denies abnormal skin rashes Lymphatics: Denies new lymphadenopathy or easy bruising Neurological:Denies numbness, tingling or new weaknesses Behavioral/Psych: Mood is stable, no new changes  All other systems were reviewed with the patient and are negative.  I have reviewed the past medical history, past surgical history, social history and family history with the patient and they are unchanged from previous note.  ALLERGIES:  has No Known Allergies.  MEDICATIONS:  Current Outpatient Medications  Medication Sig Dispense Refill   aspirin EC 81 MG tablet Take 81 mg by mouth daily. Swallow whole.     cholecalciferol (VITAMIN D) 1000 UNITS tablet Take 2,000 Units by mouth daily.     levothyroxine (SYNTHROID) 75 MCG tablet Take 75 mcg by mouth daily.     Multiple Vitamin (MULTIVITAMIN) tablet Take 1 tablet by mouth daily.     omeprazole (PRILOSEC) 20 MG capsule Take 20 mg by mouth daily.      No current facility-administered medications for this visit.    SUMMARY OF ONCOLOGIC HISTORY: Oncology History  CLL (chronic lymphocytic leukemia) ([place])  06/12/2012 Initial Diagnosis   CLL (chronic lymphocytic leukemia)   07/20/2014 Pathology Results   FISH was normal  08/24/2021 Cancer Staging   Staging form: Chronic Lymphocytic Leukemia / Small Lymphocytic Lymphoma, AJCC 8th Edition - Clinical stage from 08/24/2021: Modified Rai Stage I (Modified Rai risk: Intermediate,  Lymphocytosis: Present, Adenopathy: Present, Organomegaly: Absent, Anemia: Absent, Thrombocytopenia: Absent) - Signed by [name], MD on 09/03/2022 Stage prefix: Initial diagnosis   09/03/2022 Imaging   1. Isolated enlarged 16 mm short axis left axillary node. No other lymphadenopathy in the chest, abdomen, or pelvis. 2. 2.1 cm right thyroid nodule. Recommend thyroid US (ref: J Am Coll Radiol. 2015 Feb;12(2): 143-50). 3. Tiny bilateral nonobstructing renal stones.     PHYSICAL EXAMINATION: ECOG PERFORMANCE STATUS: 0 - Asymptomatic  Vitals:   09/03/22 0925  BP: (!) 190/81  Pulse: 80  Resp: 17  Temp: 98.3 F (36.8 C)  SpO2: 99%   Filed Weights   09/03/22 0925  Weight: 172 lb 12.8 oz (78.4 kg)    GENERAL:alert, no distress and comfortable.  Noted a canker sore at the roof of her mouth NEURO: alert & oriented x 3 with fluent speech, no focal motor/sensory deficits  LABORATORY DATA:  I have reviewed the data as listed    Component Value Date/Time   NA 143 04/30/2021 1809   NA 143 07/19/2016 0910   K 3.9 04/30/2021 1809   K 4.6 07/19/2016 0910   CL 109 04/30/2021 1809   CL 104 12/11/2012 1408   CO2 27 04/30/2021 1809   CO2 28 07/19/2016 0910   GLUCOSE 84 04/30/2021 1809   GLUCOSE 115 07/19/2016 0910   GLUCOSE 94 12/11/2012 1408   BUN 15 04/30/2021 1809   BUN 11.5 07/19/2016 0910   CREATININE 0.80 08/30/2022 1901   CREATININE 0.8 07/19/2016 0910   CALCIUM 9.8 04/30/2021 1809   CALCIUM 10.6 (H) 07/19/2016 0910   PROT 6.6 07/22/2018 1111   PROT 6.9 07/19/2016 0910   ALBUMIN 3.9 07/22/2018 1111   ALBUMIN 3.8 07/19/2016 0910   AST 24 07/22/2018 1111   AST 28 07/19/2016 0910   ALT 29 07/22/2018 1111   ALT 52 07/19/2016 0910   ALKPHOS 97 07/22/2018 1111   ALKPHOS 114 07/19/2016 0910   BILITOT 0.4 07/22/2018 1111   BILITOT 0.32 07/19/2016 0910   GFRNONAA >60 04/30/2021 1809   GFRAA >60 07/22/2018 1111    No results found for: "SPEP", "UPEP"  Lab Results   Component Value Date   WBC 47.2 (H) 08/24/2022   NEUTROABS 4.1 08/24/2022   HGB 13.2 08/24/2022   HCT 40.4 08/24/2022   MCV 94.6 08/24/2022   PLT 208 08/24/2022      Chemistry      Component Value Date/Time   NA 143 04/30/2021 1809   NA 143 07/19/2016 0910   K 3.9 04/30/2021 1809   K 4.6 07/19/2016 0910   CL 109 04/30/2021 1809   CL 104 12/11/2012 1408   CO2 27 04/30/2021 1809   CO2 28 07/19/2016 0910   BUN 15 04/30/2021 1809   BUN 11.5 07/19/2016 0910   CREATININE 0.80 08/30/2022 1901   CREATININE 0.8 07/19/2016 0910      Component Value Date/Time   CALCIUM 9.8 04/30/2021 1809   CALCIUM 10.6 (H) 07/19/2016 0910   ALKPHOS 97 07/22/2018 1111   ALKPHOS 114 07/19/2016 0910   AST 24 07/22/2018 1111   AST 28 07/19/2016 0910   ALT 29 07/22/2018 1111   ALT 52 07/19/2016 0910   BILITOT 0.4 07/22/2018 1111   BILITOT 0.32 07/19/2016 0910       RADIOGRAPHIC  STUDIES: I have reviewed imaging study with the patient and her husband I have personally reviewed the radiological images as listed and agreed with the findings in the report. CT CHEST ABDOMEN PELVIS W CONTRAST  Result Date: 09/02/2022 CLINICAL DATA:  CLL. Staging. Insert body on right axillary lymphadenopathy. EXAM: CT CHEST, ABDOMEN, AND PELVIS WITH CONTRAST TECHNIQUE: Multidetector CT imaging of the chest, abdomen and pelvis was performed following the standard protocol during bolus administration of intravenous contrast. RADIATION DOSE REDUCTION: This exam was performed according to the departmental dose-optimization program which includes automated exposure control, adjustment of the mA and/or kV according to patient size and/or use of iterative reconstruction technique. CONTRAST:  86m OMNIPAQUE IOHEXOL 300 MG/ML  SOLN COMPARISON:  Chest CT 07/21/2013 FINDINGS: CT CHEST FINDINGS Cardiovascular: The heart size is normal. No substantial pericardial effusion. No thoracic aortic aneurysm. No substantial atherosclerosis of  the thoracic aorta. Mediastinum/Nodes: No mediastinal lymphadenopathy. There is no hilar lymphadenopathy. The esophagus has normal imaging features. 2.1 cm right thyroid nodule evident. No right axillary lymphadenopathy. 16 mm short axis isolated enlarged left axillary node visible on 19/2. No subpectoral or supraclavicular lymphadenopathy evident. Lungs/Pleura: 5 mm right middle lobe nodule on 92/4 is stable since prior compatible with benign etiology. No followup imaging is recommended. No new suspicious pulmonary nodule or mass. No focal airspace consolidation. No pleural effusion. Musculoskeletal: No worrisome lytic or sclerotic osseous abnormality. CT ABDOMEN PELVIS FINDINGS Hepatobiliary: No suspicious focal abnormality within the liver parenchyma. There is no evidence for gallstones, gallbladder wall thickening, or pericholecystic fluid. No intrahepatic or extrahepatic biliary dilation. Pancreas: No focal mass lesion. No dilatation of the main duct. No intraparenchymal cyst. No peripancreatic edema. Spleen: No splenomegaly. No focal mass lesion. Adrenals/Urinary Tract: No adrenal nodule or mass. Tiny bilateral nonobstructing renal stones evident. No evidence for hydroureter. The urinary bladder appears normal for the degree of distention. Stomach/Bowel: Stomach is unremarkable. No gastric wall thickening. No evidence of outlet obstruction. Duodenum is normally positioned as is the ligament of Treitz. No small bowel wall thickening. No small bowel dilatation. The terminal ileum is normal. The appendix is normal. No gross colonic mass. No colonic wall thickening. Vascular/Lymphatic: No abdominal aortic aneurysm. No abdominal aortic atherosclerotic calcification. There is no gastrohepatic or hepatoduodenal ligament lymphadenopathy. No retroperitoneal or mesenteric lymphadenopathy. No pelvic sidewall lymphadenopathy. Reproductive: The uterus is unremarkable.  There is no adnexal mass. Other: No intraperitoneal  free fluid. Musculoskeletal: No worrisome lytic or sclerotic osseous abnormality. IMPRESSION: 1. Isolated enlarged 16 mm short axis left axillary node. No other lymphadenopathy in the chest, abdomen, or pelvis. 2. 2.1 cm right thyroid nodule. Recommend thyroid [place](ref: J Am Coll Radiol. 2015 Feb;12(2): 143-50). 3. Tiny bilateral nonobstructing renal stones. Electronically Signed   By: EMisty StanleyM.D.   On: 09/02/2022 08:08

## 2022-09-03 NOTE — Assessment & Plan Note (Signed)
She has a canker sore at the roof of her mouth I recommend conservative approach with salt water gargle

## 2022-09-03 NOTE — Assessment & Plan Note (Addendum)
I have reviewed CT imaging with the patient We discussed the role of thyroid ultrasound She had remote history of left thyroidectomy and follows with endocrinologist The palpable lymphadenopathy from last week is revealed on CT as slightly enlarged lymph node in the left axilla We discussed the risk and benefits of biopsy At this point in time, I recommend observation I plan to see her again in 6 months for further follow-up We discussed the natural history of CLL There is no indication for treatment at this point in time

## 2022-09-11 ENCOUNTER — Other Ambulatory Visit: Payer: Self-pay | Admitting: Internal Medicine

## 2022-09-11 DIAGNOSIS — E041 Nontoxic single thyroid nodule: Secondary | ICD-10-CM

## 2022-09-26 ENCOUNTER — Ambulatory Visit
Admission: RE | Admit: 2022-09-26 | Discharge: 2022-09-26 | Disposition: A | Discharge status: Home / Self Care | Payer: PPO | Source: Ambulatory Visit | Attending: Internal Medicine | Admitting: Internal Medicine

## 2022-09-26 DIAGNOSIS — E041 Nontoxic single thyroid nodule: Secondary | ICD-10-CM

## 2022-12-27 ENCOUNTER — Other Ambulatory Visit: Payer: Self-pay | Admitting: Family Medicine

## 2022-12-27 DIAGNOSIS — Z1231 Encounter for screening mammogram for malignant neoplasm of breast: Secondary | ICD-10-CM

## 2022-12-31 ENCOUNTER — Other Ambulatory Visit: Payer: Self-pay | Admitting: Family Medicine

## 2022-12-31 DIAGNOSIS — N6320 Unspecified lump in the left breast, unspecified quadrant: Secondary | ICD-10-CM

## 2022-12-31 DIAGNOSIS — Z6832 Body mass index (BMI) 32.0-32.9, adult: Secondary | ICD-10-CM

## 2023-01-07 ENCOUNTER — Ambulatory Visit
Admission: RE | Admit: 2023-01-07 | Discharge: 2023-01-07 | Disposition: A | Discharge status: Home / Self Care | Payer: PPO | Source: Ambulatory Visit | Attending: Gastroenterology | Admitting: Gastroenterology

## 2023-01-07 ENCOUNTER — Other Ambulatory Visit: Payer: Self-pay | Admitting: Gastroenterology

## 2023-01-07 DIAGNOSIS — R197 Diarrhea, unspecified: Secondary | ICD-10-CM

## 2023-01-10 ENCOUNTER — Other Ambulatory Visit: Payer: Self-pay | Admitting: Family Medicine

## 2023-01-10 DIAGNOSIS — N6320 Unspecified lump in the left breast, unspecified quadrant: Secondary | ICD-10-CM

## 2023-01-11 ENCOUNTER — Other Ambulatory Visit: Payer: PPO

## 2023-01-23 ENCOUNTER — Ambulatory Visit
Admission: RE | Admit: 2023-01-23 | Discharge: 2023-01-23 | Disposition: A | Discharge status: Home / Self Care | Payer: PPO | Source: Ambulatory Visit | Attending: Family Medicine | Admitting: Family Medicine

## 2023-01-23 ENCOUNTER — Other Ambulatory Visit: Payer: Self-pay | Admitting: Family Medicine

## 2023-01-23 DIAGNOSIS — R599 Enlarged lymph nodes, unspecified: Secondary | ICD-10-CM

## 2023-01-23 DIAGNOSIS — N6320 Unspecified lump in the left breast, unspecified quadrant: Secondary | ICD-10-CM

## 2023-01-24 ENCOUNTER — Ambulatory Visit: Payer: PPO

## 2023-01-24 ENCOUNTER — Telehealth: Payer: Self-pay

## 2023-01-24 NOTE — Telephone Encounter (Signed)
Called her back and appt scheduled on 5/13. She is aware of appt.

## 2023-01-24 NOTE — Telephone Encounter (Signed)
Called and left a message asking her to call the office back to schedule appt on 5/13.

## 2023-01-25 ENCOUNTER — Telehealth: Payer: Self-pay

## 2023-01-25 NOTE — Telephone Encounter (Signed)
Called and moved appt to 5/10 at 1200 to see Dr. Bertis Ruddy. She is aware of appt date/ time.

## 2023-01-29 ENCOUNTER — Ambulatory Visit
Admission: RE | Admit: 2023-01-29 | Discharge: 2023-01-29 | Disposition: A | Discharge status: Home / Self Care | Payer: PPO | Source: Ambulatory Visit | Attending: Family Medicine | Admitting: Family Medicine

## 2023-01-29 DIAGNOSIS — R599 Enlarged lymph nodes, unspecified: Secondary | ICD-10-CM

## 2023-01-29 DIAGNOSIS — N6320 Unspecified lump in the left breast, unspecified quadrant: Secondary | ICD-10-CM

## 2023-01-29 HISTORY — PX: BREAST BIOPSY: SHX20

## 2023-02-01 ENCOUNTER — Encounter: Payer: Self-pay | Admitting: Hematology and Oncology

## 2023-02-01 ENCOUNTER — Inpatient Hospital Stay: Payer: PPO

## 2023-02-01 ENCOUNTER — Other Ambulatory Visit: Payer: Self-pay

## 2023-02-01 ENCOUNTER — Inpatient Hospital Stay: Payer: PPO | Attending: Hematology and Oncology | Admitting: Hematology and Oncology

## 2023-02-01 VITALS — BP 155/82 | HR 91 | Temp 98.2°F | Resp 20 | Wt 171.2 lb

## 2023-02-01 DIAGNOSIS — R194 Change in bowel habit: Secondary | ICD-10-CM | POA: Diagnosis not present

## 2023-02-01 DIAGNOSIS — C50812 Malignant neoplasm of overlapping sites of left female breast: Secondary | ICD-10-CM | POA: Insufficient documentation

## 2023-02-01 DIAGNOSIS — C9110 Chronic lymphocytic leukemia of B-cell type not having achieved remission: Secondary | ICD-10-CM | POA: Diagnosis not present

## 2023-02-01 DIAGNOSIS — Z780 Asymptomatic menopausal state: Secondary | ICD-10-CM | POA: Diagnosis not present

## 2023-02-01 DIAGNOSIS — C773 Secondary and unspecified malignant neoplasm of axilla and upper limb lymph nodes: Secondary | ICD-10-CM | POA: Insufficient documentation

## 2023-02-01 DIAGNOSIS — C50912 Malignant neoplasm of unspecified site of left female breast: Secondary | ICD-10-CM

## 2023-02-01 DIAGNOSIS — Z170 Estrogen receptor positive status [ER+]: Secondary | ICD-10-CM | POA: Diagnosis not present

## 2023-02-01 DIAGNOSIS — Z803 Family history of malignant neoplasm of breast: Secondary | ICD-10-CM | POA: Diagnosis not present

## 2023-02-01 DIAGNOSIS — C7951 Secondary malignant neoplasm of bone: Secondary | ICD-10-CM | POA: Diagnosis not present

## 2023-02-01 LAB — TSH: TSH: 0.408 u[IU]/mL (ref 0.350–4.500)

## 2023-02-01 LAB — CBC WITH DIFFERENTIAL (CANCER CENTER ONLY)
Abs Immature Granulocytes: 0.1 10*3/uL — ABNORMAL HIGH (ref 0.00–0.07)
Basophils Absolute: 0.1 10*3/uL (ref 0.0–0.1)
Basophils Relative: 0 %
Eosinophils Absolute: 0.8 10*3/uL — ABNORMAL HIGH (ref 0.0–0.5)
Eosinophils Relative: 1 %
HCT: 46.2 % — ABNORMAL HIGH (ref 36.0–46.0)
Hemoglobin: 14.7 g/dL (ref 12.0–15.0)
Immature Granulocytes: 0 %
Lymphocytes Relative: 91 %
Lymphs Abs: 60.3 10*3/uL — ABNORMAL HIGH (ref 0.7–4.0)
MCH: 30 pg (ref 26.0–34.0)
MCHC: 31.8 g/dL (ref 30.0–36.0)
MCV: 94.3 fL (ref 80.0–100.0)
Monocytes Absolute: 0.4 10*3/uL (ref 0.1–1.0)
Monocytes Relative: 1 %
Neutro Abs: 4.9 10*3/uL (ref 1.7–7.7)
Neutrophils Relative %: 7 %
Platelet Count: 250 10*3/uL (ref 150–400)
RBC: 4.9 MIL/uL (ref 3.87–5.11)
RDW: 13.6 % (ref 11.5–15.5)
Smear Review: NORMAL
WBC Count: 66.4 10*3/uL (ref 4.0–10.5)
nRBC: 0 % (ref 0.0–0.2)

## 2023-02-01 LAB — CMP (CANCER CENTER ONLY)
ALT: 21 U/L (ref 0–44)
AST: 19 U/L (ref 15–41)
Albumin: 4.5 g/dL (ref 3.5–5.0)
Alkaline Phosphatase: 99 U/L (ref 38–126)
Anion gap: 4 — ABNORMAL LOW (ref 5–15)
BUN: 15 mg/dL (ref 8–23)
CO2: 30 mmol/L (ref 22–32)
Calcium: 10.6 mg/dL — ABNORMAL HIGH (ref 8.9–10.3)
Chloride: 107 mmol/L (ref 98–111)
Creatinine: 0.71 mg/dL (ref 0.44–1.00)
GFR, Estimated: >60 mL/min (ref >60)
Glucose, Bld: 88 mg/dL (ref 70–99)
Potassium: 4.3 mmol/L (ref 3.5–5.1)
Sodium: 141 mmol/L (ref 135–145)
Total Bilirubin: 0.4 mg/dL (ref 0.3–1.2)
Total Protein: 7.1 g/dL (ref 6.5–8.1)

## 2023-02-01 NOTE — Assessment & Plan Note (Signed)
Leukocyte count is higher than before She is not symptomatic from CLL As above, I will order imaging study to assess

## 2023-02-01 NOTE — Progress Notes (Signed)
CRITICAL VALUE STICKER  CRITICAL VALUE: WBC 66.4  RECEIVER (on-site recipient of call): Tilford Pillar, RN  DATE & TIME NOTIFIED: 02/01/23 at 1318  MESSENGER (representative from lab): Twana First  MD NOTIFIED: Dr. Bertis Ruddy  TIME OF NOTIFICATION: 02/01/23 at 1320  RESPONSE: Will review labs.

## 2023-02-01 NOTE — Progress Notes (Signed)
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 before She is not symptomatic from CLL As above, I will order imaging study to assess  Bowel habit changes The cause is unknown I recommend imaging study to assess In the meantime, continue supportive care  Orders Placed This Encounter  Procedures   NM PET Image Initial (PI) Skull Base To Thigh (F-18 FDG)    Standing Status:   Future    Standing Expiration Date:   02/01/2024    Order Specific Question:   If indicated for the ordered procedure, I authorize the administration of a radiopharmaceutical per Radiology protocol    Answer:   Yes    Order Specific Question:   Preferred imaging location?    Answer:   [facility]   CMP (Cancer Center only)    Standing Status:   Future    Number of Occurrences:   1    Standing Expiration Date:   02/01/2024   CBC with Differential (Cancer Center Only)    Standing Status:   Future    Number of Occurrences:   1    Standing Expiration Date:   02/01/2024   TSH    Standing Status:   Future    Number of Occurrences:   1    Standing Expiration Date:   02/01/2024   Cancer antigen 15-3    Standing Status:   Future    Number of Occurrences:   1    Standing Expiration Date:   02/01/2024   Cancer antigen 27.29    Standing Status:   Future    Number of Occurrences:   1    Standing Expiration Date:   02/01/2024    All questions were answered. The patient knows  to call the clinic with any problems, questions or concerns. The total time spent in the appointment was 40 minutes encounter with patients including review of chart and various tests results, discussions about plan of care and coordination of care plan   Artis Delay, MD 02/01/2023 2:11 PM  INTERVAL HISTORY: Please see below for problem oriented charting. she returns for urgent evaluation The patient palpated a breast mass early April and was evaluated leading to mammogram and biopsy, confirming the diagnosis of breast cancer She also noted some changes in her bowel habits for the past 2 months She have occasional dizziness and nausea Denies bone pain We reviewed pathology report and discussed plan of care  REVIEW OF SYSTEMS:   Constitutional: Denies fevers, chills or abnormal weight loss Eyes: Denies blurriness of vision Ears, nose, mouth, throat, and face: Denies mucositis or sore throat Respiratory: Denies cough, dyspnea or wheezes Cardiovascular: Denies palpitation, chest discomfort or lower extremity swelling Gastrointestinal:  Denies nausea, heartburn or change in bowel habits Skin: Denies abnormal skin rashes Neurological:Denies numbness, tingling or new weaknesses Behavioral/Psych: Mood is stable, no new changes  All other systems were reviewed with the patient and are negative.  I have reviewed the past medical history,  past surgical history, social history and family history with the patient and they are unchanged from previous note.  ALLERGIES:  has No Known Allergies.  MEDICATIONS:  Current Outpatient Medications  Medication Sig Dispense Refill   aspirin EC 81 MG tablet Take 81 mg by mouth daily. Swallow whole.     cholecalciferol (VITAMIN D) 1000 UNITS tablet Take 2,000 Units by mouth daily.     levothyroxine (SYNTHROID) 75 MCG tablet Take 75 mcg by mouth daily.     Multiple Vitamin (MULTIVITAMIN) tablet Take 1 tablet by mouth daily.     omeprazole (PRILOSEC) 20 MG  capsule Take 20 mg by mouth daily.      No current facility-administered medications for this visit.    SUMMARY OF ONCOLOGIC HISTORY: Oncology History  CLL (chronic lymphocytic leukemia) (HCC)  06/12/2012 Initial Diagnosis   CLL (chronic lymphocytic leukemia)   07/20/2014 Pathology Results   FISH was normal   08/24/2021 Cancer Staging   Staging form: Chronic Lymphocytic Leukemia / Small Lymphocytic Lymphoma, AJCC 8th Edition - Clinical stage from 08/24/2021: Modified Rai Stage I (Modified Rai risk: Intermediate, Lymphocytosis: Present, Adenopathy: Present, Organomegaly: Absent, Anemia: Absent, Thrombocytopenia: Absent) - Signed by Artis Delay, MD on 09/03/2022 Stage prefix: Initial diagnosis   09/03/2022 Imaging   1. Isolated enlarged 16 mm short axis left axillary node. No other lymphadenopathy in the chest, abdomen, or pelvis. 2. 2.1 cm right thyroid nodule. Recommend thyroid US (ref: J Am Coll Radiol. 2015 Feb;12(2): 143-50). 3. Tiny bilateral nonobstructing renal stones.   Breast cancer metastasized to axillary lymph node, left (HCC)  01/23/2023 Imaging   IMPRESSION: 1. 2.3 cm mass in the 3 o'clock position of the left breast with imaging features highly suspicious for primary breast cancer. 2. 3.1 cm markedly enlarged left axillary lymph node most likely representing a metastatic node associated with the patient's probable primary breast cancer. A single enlarged lymph node associated with the patient's chronic lymphocytic leukemia is less likely.   RECOMMENDATION: 1. Ultrasound-guided core needle biopsy of the 2.3 cm mass in the 3 o'clock position of the left breast. 2. Ultrasound-guided core needle biopsy of the 3.1 cm enlarged left axillary lymph node. This has been discussed with the patient and her husband and the biopsies have been scheduled at 12:45 p.m. on 01/29/2023.   01/29/2023 Pathology Results   Patient: [name] Collected: 01/29/2023 Client: [facility] Accession: [id number] Received: 01/29/2023 Amie Portland, MD DOB: 12/22/1950 Age: 72 Gender: F Reported: 01/30/2023 [address] Patient Ph: [phone] MRN #: [id number] [place], [place] [address] Client Acc#: Chart #: [id number] Phone: [phone] Fax: CC: [facility] INTERNAL CC CC: [name] REPORT OF SURGICAL PATHOLOGY Addendum: Breast Biomarker Results FINAL DIAGNOSIS Diagnosis 1. Breast, left, needle core biopsy, 3 o'clock, mass, coil clip - INVASIVE MAMMARY CARCINOMA, SEE NOTE - MAMMARY CARCINOMA IN SITU, INTERMEDIATE GRADE - TUBULE FORMATION: SCORE 3 - NUCLEAR PLEOMORPHISM: SCORE 3 - MITOTIC COUNT: SCORE 1 - TOTAL SCORE: 7 - OVERALL GRADE: 2 - LYMPHOVASCULAR INVASION: NOT IDENTIFIED - CANCER LENGTH: 1.3 CM - CALCIFICATIONS: PRESENT, RARE - OTHER FINDINGS: INTRADUCTAL PAPILLOMA WITH ASSOCIATED MICROCALCIFICATION 2. Lymph node, needle/core biopsy, axilla, lymph node, hydromark clip - METASTATIC CARCINOMA - GREATEST DIMENSION: 1.2 CM - NO DEFINITIVE LYMPHOID TISSUE IDENTIFIED - SEE COMMENT Diagnosis Note 1. -2. Dr. Luisa Hart reviewed the case and concurs with the interpretation. Immunohistochemical staining for E-cadherin and a breast prognostic profile (ER, PR, Ki-67 and HER2) on block 1A is pending  and will be reported in an addendum. 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  1. Breast, left, needle core biopsy, 3 o'clock, mass, coil clip PROGNOSTIC INDICATORS Results: IMMUNOHISTOCHEMICAL AND MORPHOMETRIC ANALYSIS PERFORMED MANUALLY The tumor cells are EQUIVOCAL for Her2 (2+). Her2 by FISH will be performed and the results reported separately. Estrogen Receptor: 100%, POSITIVE, STRONG STAINING INTENSITY Progesterone Receptor: 20%, POSITIVE, STRONG STAINING INTENSITY Proliferation Marker Ki67: 45%    02/01/2023 Initial Diagnosis   Breast cancer metastasized to axillary lymph node, left (HCC)   02/01/2023 Cancer  Staging   Staging form: Breast, AJCC 8th Edition - Clinical stage from 02/01/2023: Stage IIA (cT2, cN1(f), cM0, G2, ER+, PR+, HER2: Equivocal) - Signed by Artis Delay, MD on 02/01/2023 Stage prefix: Initial diagnosis Method of lymph node assessment: Fine needle aspiration Histologic grading system: 3 grade system     PHYSICAL EXAMINATION: ECOG PERFORMANCE STATUS: 1 - Symptomatic but completely ambulatory  Vitals:   02/01/23 1159  BP: (!) 155/82  Pulse: 91  Resp: 20  Temp: 98.2 F (36.8 C)  SpO2: 98%   Filed Weights   02/01/23 1159  Weight: 171 lb 3.2 oz (77.7 kg)    GENERAL:alert, no distress and comfortable NEURO: alert & oriented x 3 with fluent speech, no focal motor/sensory deficits  LABORATORY DATA:  I have reviewed the data as listed    Component Value Date/Time   NA 141 02/01/2023 1254   NA 143 07/19/2016 0910   K 4.3 02/01/2023 1254   K 4.6 07/19/2016 0910   CL 107 02/01/2023 1254   CL 104 12/11/2012 1408   CO2 30 02/01/2023 1254   CO2 28 07/19/2016 0910   GLUCOSE 88 02/01/2023 1254   GLUCOSE 115 07/19/2016 0910   GLUCOSE 94 12/11/2012 1408   BUN 15 02/01/2023 1254   BUN 11.5 07/19/2016 0910   CREATININE 0.71 02/01/2023 1254   CREATININE 0.8 07/19/2016 0910   CALCIUM 10.6 (H) 02/01/2023 1254   CALCIUM 10.6 (H) 07/19/2016 0910   PROT 7.1 02/01/2023 1254   PROT 6.9 07/19/2016 0910   ALBUMIN 4.5 02/01/2023 1254   ALBUMIN 3.8 07/19/2016 0910   AST 19 02/01/2023 1254   AST 28 07/19/2016 0910   ALT 21 02/01/2023 1254   ALT 52 07/19/2016 0910   ALKPHOS 99 02/01/2023 1254   ALKPHOS 114 07/19/2016 0910   BILITOT 0.4 02/01/2023 1254   BILITOT 0.32 07/19/2016 0910   GFRNONAA >60 02/01/2023 1254   GFRAA >60 07/22/2018 1111    No results found for: "SPEP", "UPEP"  Lab Results  Component Value Date   WBC 66.4 (HH) 02/01/2023   NEUTROABS 4.9 02/01/2023   HGB 14.7 02/01/2023   HCT 46.2 (H) 02/01/2023   MCV 94.3 02/01/2023   PLT 250 02/01/2023       Chemistry      Component Value Date/Time   NA 141 02/01/2023 1254   NA 143 07/19/2016 0910   K 4.3 02/01/2023 1254   K 4.6 07/19/2016 0910   CL 107 02/01/2023 1254   CL 104 12/11/2012 1408   CO2 30 02/01/2023 1254   CO2 28 07/19/2016 0910   BUN 15 02/01/2023 1254   BUN 11.5 07/19/2016 0910   CREATININE 0.71 02/01/2023 1254   CREATININE 0.8 07/19/2016 0910      Component Value Date/Time   CALCIUM 10.6 (H) 02/01/2023 1254   CALCIUM 10.6 (H) 07/19/2016 0910   ALKPHOS 99 02/01/2023 1254   ALKPHOS 114 07/19/2016 0910   AST  19 02/01/2023 1254   AST 28 07/19/2016 0910   ALT 21 02/01/2023 1254   ALT 52 07/19/2016 0910   BILITOT 0.4 02/01/2023 1254   BILITOT 0.32 07/19/2016 0910       RADIOGRAPHIC STUDIES: I have personally reviewed the radiological images as listed and agreed with the findings in the report. [place] LT BREAST BX W LOC DEV 1ST LESION IMG BX SPEC US GUIDE  Addendum Date: 01/30/2023   ADDENDUM REPORT: 01/30/2023 13:40 ADDENDUM: PATHOLOGY revealed: Site 1. Breast, LEFT, needle core biopsy, 3 o'clock, mass, coil clip - INVASIVE MAMMARY CARCINOMA, - MAMMARY CARCINOMA IN SITU, INTERMEDIATE GRADE - OVERALL GRADE: 2 -LYMPHOVASCULAR INVASION: NOT IDENTIFIED - CALCIFICATIONS: PRESENT, RARE - OTHER FINDINGS: INTRADUCTAL PAPILLOMA WITH ASSOCIATED MICROCALCIFICATION. Pathology results are CONCORDANT with imaging findings, per Dr. Amie Portland. PATHOLOGY revealed: Site 2. Lymph node, needle/core biopsy, axilla, lymph node, hydromark clip - METASTATIC CARCINOMA - NO DEFINITIVE LYMPHOID TISSUE IDENTIFIED. Pathology results are CONCORDANT with imaging findings, per Dr. Amie Portland. Pathology results and recommendations below were discussed with patient by telephone on 01/30/2023. Patient reported biopsy site within normal limits with slight tenderness at the site. Post biopsy care instructions were reviewed, questions were answered and my direct phone number was provided to patient. Patient was  instructed to call [facility] if any concerns or questions arise related to the biopsy. RECOMMENDATION: Patient sees Dr. Artis Delay at one [facility] at [facility] and has an appointment with her this Friday, Feb 01, 2023 at 12:00. She wishes to discuss surgical consultation with Dr. Bertis Ruddy during this visit. Dr.Gurman Ashland's office notified. Pathology results reported by Donell Sievert, RN on 01/30/2023. Electronically Signed   By: Amie Portland M.D.   On: 01/30/2023 13:40   Result Date: 01/30/2023 CLINICAL DATA:  Patient presents for ultrasound-guided core needle biopsy of a left breast mass and an enlarged left axillary lymph node. EXAM: ULTRASOUND GUIDED LEFT BREAST CORE NEEDLE BIOPSY ULTRASOUND-GUIDED LEFT AXILLARY LYMPH NODE CORE NEEDLE BIOPSY COMPARISON:  Previous exam(s). PROCEDURE: I met with the patient and we discussed the procedure of ultrasound-guided biopsy, including benefits and alternatives. We discussed the high likelihood of a successful procedure. We discussed the risks of the procedure, including infection, bleeding, tissue injury, clip migration, and inadequate sampling. Informed written consent was given. The usual time-out protocol was performed immediately prior to the procedure. Biopsy #1: Left breast mass. Lesion quadrant: Upper outer quadrant: Near 3 o'clock, 5 cm the nipple. Using sterile technique and 1% Lidocaine as local anesthetic, under direct ultrasound visualization, a 12 gauge spring-loaded device was used to perform biopsy of the 2.3 cm mass at 3 o'clock using an inferior approach. At the conclusion of the procedure a coil shaped tissue marker clip was deployed into the biopsy cavity. Biopsy #1: Left axillary lymph node. Lesion location: Left axilla. Using sterile technique and 1% Lidocaine as local anesthetic, under direct ultrasound visualization, a 14 gauge spring-loaded device was used to perform biopsy of the enlarged left axillary lymph node  using an inferolateral approach. At the conclusion of the procedure a HydroMARK tissue marker clip was deployed into the biopsy cavity. Follow up 2 view mammogram was performed and dictated separately. IMPRESSION: Ultrasound guided biopsy of left breast mass and enlarged left axillary lymph node. No apparent complications. Electronically Signed: By: Amie Portland M.D. On: 01/29/2023 13:35  [place] AXILLARY NODE CORE BIOPSY LEFT  Addendum Date: 01/30/2023   ADDENDUM REPORT: 01/30/2023 13:40 ADDENDUM: PATHOLOGY revealed: Site 1.  Breast, LEFT, needle core biopsy, 3 o'clock, mass, coil clip - INVASIVE MAMMARY CARCINOMA, - MAMMARY CARCINOMA IN SITU, INTERMEDIATE GRADE - OVERALL GRADE: 2 -LYMPHOVASCULAR INVASION: NOT IDENTIFIED - CALCIFICATIONS: PRESENT, RARE - OTHER FINDINGS: INTRADUCTAL PAPILLOMA WITH ASSOCIATED MICROCALCIFICATION. Pathology results are CONCORDANT with imaging findings, per Dr. Amie Portland. PATHOLOGY revealed: Site 2. Lymph node, needle/core biopsy, axilla, lymph node, hydromark clip - METASTATIC CARCINOMA - NO DEFINITIVE LYMPHOID TISSUE IDENTIFIED. Pathology results are CONCORDANT with imaging findings, per Dr. Amie Portland. Pathology results and recommendations below were discussed with patient by telephone on 01/30/2023. Patient reported biopsy site within normal limits with slight tenderness at the site. Post biopsy care instructions were reviewed, questions were answered and my direct phone number was provided to patient. Patient was instructed to call [facility] if any concerns or questions arise related to the biopsy. RECOMMENDATION: Patient sees Dr. Artis Delay at one [facility] and has an appointment with her this Friday, Feb 01, 2023 at 12:00. She wishes to discuss surgical consultation with Dr. Bertis Ruddy during this visit. Dr.Erisa Mehlman's office notified. Pathology results reported by Donell Sievert, RN on 01/30/2023. Electronically Signed   By: Amie Portland M.D.   On: 01/30/2023 13:40   Result Date: 01/30/2023 CLINICAL DATA:  Patient presents for ultrasound-guided core needle biopsy of a left breast mass and an enlarged left axillary lymph node. EXAM: ULTRASOUND GUIDED LEFT BREAST CORE NEEDLE BIOPSY ULTRASOUND-GUIDED LEFT AXILLARY LYMPH NODE CORE NEEDLE BIOPSY COMPARISON:  Previous exam(s). PROCEDURE: I met with the patient and we discussed the procedure of ultrasound-guided biopsy, including benefits and alternatives. We discussed the high likelihood of a successful procedure. We discussed the risks of the procedure, including infection, bleeding, tissue injury, clip migration, and inadequate sampling. Informed written consent was given. The usual time-out protocol was performed immediately prior to the procedure. Biopsy #1: Left breast mass. Lesion quadrant: Upper outer quadrant: Near 3 o'clock, 5 cm the nipple. Using sterile technique and 1% Lidocaine as local anesthetic, under direct ultrasound visualization, a 12 gauge spring-loaded device was used to perform biopsy of the 2.3 cm mass at 3 o'clock using an inferior approach. At the conclusion of the procedure a coil shaped tissue marker clip was deployed into the biopsy cavity. Biopsy #1: Left axillary lymph node. Lesion location: Left axilla. Using sterile technique and 1% Lidocaine as local anesthetic, under direct ultrasound visualization, a 14 gauge spring-loaded device was used to perform biopsy of the enlarged left axillary lymph node using an inferolateral approach. At the conclusion of the procedure a HydroMARK tissue marker clip was deployed into the biopsy cavity. Follow up 2 view mammogram was performed and dictated separately. IMPRESSION: Ultrasound guided biopsy of left breast mass and enlarged left axillary lymph node. No apparent complications. Electronically Signed: By: Amie Portland M.D. On: 01/29/2023 13:35  MM CLIP PLACEMENT LEFT  Result Date: 01/29/2023 CLINICAL DATA:  Assessed post  biopsy marker clip placements following ultrasound-guided core needle biopsy of a left breast mass and a left axilla lymph node. EXAM: 3D DIAGNOSTIC LEFT MAMMOGRAM POST ULTRASOUND BIOPSY COMPARISON:  Previous exam(s). FINDINGS: 3D Mammographic images were obtained following ultrasound guided biopsy of left mass and a left axilla lymph. The coil shaped biopsy clip lies within the mass the upper outer left breast. The spiral HydroMARK clip lies within the enlarged left axillary lymph nodes. IMPRESSION: Appropriate positioning of the coil shaped and HydroMARK biopsy marking clips at the site of biopsy in the  left breast and left axilla as detailed above. Final Assessment: Post Procedure Mammograms for Marker Placement Electronically Signed   By: Amie Portland M.D.   On: 01/29/2023 14:00  MM 3D DIAGNOSTIC MAMMOGRAM BILATERAL BREAST  Result Date: 01/23/2023 CLINICAL DATA:  New mass felt by the patient in the upper outer left breast for the past 2-3 months. She has chronic lymphocytic leukemia. Family history of breast cancer in her sister. EXAM: DIGITAL DIAGNOSTIC BILATERAL MAMMOGRAM WITH TOMOSYNTHESIS; ULTRASOUND LEFT BREAST LIMITED TECHNIQUE: Bilateral digital diagnostic mammography and breast tomosynthesis was performed.; Targeted ultrasound examination of the left breast was performed. COMPARISON:  Previous exam(s). ACR Breast Density Category c: The breasts are heterogeneously dense, which may obscure small masses. FINDINGS: There is a mildly irregular mass in the upper-outer left breast posteriorly in the region of the mass felt by the patient, marked with a metallic marker. This has indistinct margins and contains microcalcifications. There is an oval, circumscribed mass in the medial retroareolar/periareolar left breast. No interval findings suspicious for malignancy in the right breast. On physical exam, there is a faintly palpable mass measuring approximately 2.5 cm in the 3 o'clock position of the left  breast, 5 cm from the nipple. There are no palpable left axillary lymph nodes with the patient upright. Targeted ultrasound is performed, showing 2.3 x 2.2 x 1.2 cm irregular, hypoechoic mass containing calcifications and some medium echotexture portions. This has indistinct margins with ill-defined surrounding increased echogenicity and is located in the 3 o'clock position, 5 cm from the nipple. This corresponds to the palpable mass in the mass seen mammographically in this area. A 2.5 cm simple cyst is demonstrated in the 10 o'clock position of the left breast, 2 cm from the nipple, corresponding to the 2nd mammographic mass. A single markedly enlarged left axillary lymph node is demonstrated measuring 3.1 x 3.0 x 1.5 cm with obliteration of the normal fatty hilum. IMPRESSION: 1. 2.3 cm mass in the 3 o'clock position of the left breast with imaging features highly suspicious for primary breast cancer. 2. 3.1 cm markedly enlarged left axillary lymph node most likely representing a metastatic node associated with the patient's probable primary breast cancer. A single enlarged lymph node associated with the patient's chronic lymphocytic leukemia is less likely. RECOMMENDATION: 1. Ultrasound-guided core needle biopsy of the 2.3 cm mass in the 3 o'clock position of the left breast. 2. Ultrasound-guided core needle biopsy of the 3.1 cm enlarged left axillary lymph node. This has been discussed with the patient and her husband and the biopsies have been scheduled at 12:45 p.m. on 01/29/2023. I have discussed the findings and recommendations with the patient. If applicable, a reminder letter will be sent to the patient regarding the next appointment. BI-RADS CATEGORY  5: Highly suggestive of malignancy. Electronically Signed   By: Beckie Salts M.D.   On: 01/23/2023 15:25  [place] LIMITED ULTRASOUND INCLUDING AXILLA LEFT BREAST   Result Date: 01/23/2023 CLINICAL DATA:  New mass felt by the patient in the upper outer left  breast for the past 2-3 months. She has chronic lymphocytic leukemia. Family history of breast cancer in her sister. EXAM: DIGITAL DIAGNOSTIC BILATERAL MAMMOGRAM WITH TOMOSYNTHESIS; ULTRASOUND LEFT BREAST LIMITED TECHNIQUE: Bilateral digital diagnostic mammography and breast tomosynthesis was performed.; Targeted ultrasound examination of the left breast was performed. COMPARISON:  Previous exam(s). ACR Breast Density Category c: The breasts are heterogeneously dense, which may obscure small masses. FINDINGS: There is a mildly irregular mass in the upper-outer left breast posteriorly  in the region of the mass felt by the patient, marked with a metallic marker. This has indistinct margins and contains microcalcifications. There is an oval, circumscribed mass in the medial retroareolar/periareolar left breast. No interval findings suspicious for malignancy in the right breast. On physical exam, there is a faintly palpable mass measuring approximately 2.5 cm in the 3 o'clock position of the left breast, 5 cm from the nipple. There are no palpable left axillary lymph nodes with the patient upright. Targeted ultrasound is performed, showing 2.3 x 2.2 x 1.2 cm irregular, hypoechoic mass containing calcifications and some medium echotexture portions. This has indistinct margins with ill-defined surrounding increased echogenicity and is located in the 3 o'clock position, 5 cm from the nipple. This corresponds to the palpable mass in the mass seen mammographically in this area. A 2.5 cm simple cyst is demonstrated in the 10 o'clock position of the left breast, 2 cm from the nipple, corresponding to the 2nd mammographic mass. A single markedly enlarged left axillary lymph node is demonstrated measuring 3.1 x 3.0 x 1.5 cm with obliteration of the normal fatty hilum. IMPRESSION: 1. 2.3 cm mass in the 3 o'clock position of the left breast with imaging features highly suspicious for primary breast cancer. 2. 3.1 cm markedly  enlarged left axillary lymph node most likely representing a metastatic node associated with the patient's probable primary breast cancer. A single enlarged lymph node associated with the patient's chronic lymphocytic leukemia is less likely. RECOMMENDATION: 1. Ultrasound-guided core needle biopsy of the 2.3 cm mass in the 3 o'clock position of the left breast. 2. Ultrasound-guided core needle biopsy of the 3.1 cm enlarged left axillary lymph node. This has been discussed with the patient and her husband and the biopsies have been scheduled at 12:45 p.m. on 01/29/2023. I have discussed the findings and recommendations with the patient. If applicable, a reminder letter will be sent to the patient regarding the next appointment. BI-RADS CATEGORY  5: Highly suggestive of malignancy. Electronically Signed   By: Beckie Salts M.D.   On: 01/23/2023 15:25  DG Abd 2 Views  Result Date: 01/08/2023 CLINICAL DATA:  Diarrhea. EXAM: ABDOMEN - 2 VIEW COMPARISON:  None Available. FINDINGS: Possible small stone in the lower pole of the right kidney. No other renal stones identified. Calcifications in the pelvis are nonspecific but may represent phleboliths. Mild fecal loading in the ascending colon. Bowel gas pattern is otherwise unremarkable. Lung bases are normal. No free air, portal venous gas, or pneumatosis. No other abnormalities. IMPRESSION: 1. Possible small stone in the lower pole of the right kidney. 2. Mild fecal loading in the ascending colon. 3. No other abnormalities. Electronically Signed   By: Gerome Sam III M.D.   On: 01/08/2023 16:21

## 2023-02-01 NOTE — Assessment & Plan Note (Signed)
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

## 2023-02-01 NOTE — Assessment & Plan Note (Signed)
The cause is unknown I recommend imaging study to assess In the meantime, continue supportive care

## 2023-02-02 LAB — CANCER ANTIGEN 27.29: CA 27.29: 21.8 U/mL (ref 0.0–38.6)

## 2023-02-02 LAB — CANCER ANTIGEN 15-3: CA 15-3: 19.6 U/mL (ref 0.0–25.0)

## 2023-02-04 ENCOUNTER — Ambulatory Visit: Payer: PPO | Admitting: Hematology and Oncology

## 2023-02-07 ENCOUNTER — Ambulatory Visit
Admission: RE | Admit: 2023-02-07 | Discharge: 2023-02-07 | Disposition: A | Discharge status: Home / Self Care | Payer: PPO | Source: Ambulatory Visit | Attending: Hematology and Oncology | Admitting: Hematology and Oncology

## 2023-02-07 DIAGNOSIS — N200 Calculus of kidney: Secondary | ICD-10-CM | POA: Diagnosis not present

## 2023-02-07 DIAGNOSIS — C50912 Malignant neoplasm of unspecified site of left female breast: Secondary | ICD-10-CM | POA: Diagnosis present

## 2023-02-07 DIAGNOSIS — C9110 Chronic lymphocytic leukemia of B-cell type not having achieved remission: Secondary | ICD-10-CM | POA: Insufficient documentation

## 2023-02-07 DIAGNOSIS — C773 Secondary and unspecified malignant neoplasm of axilla and upper limb lymph nodes: Secondary | ICD-10-CM | POA: Diagnosis not present

## 2023-02-07 DIAGNOSIS — R937 Abnormal findings on diagnostic imaging of other parts of musculoskeletal system: Secondary | ICD-10-CM | POA: Insufficient documentation

## 2023-02-07 LAB — GLUCOSE, CAPILLARY: Glucose-Capillary: 91 mg/dL (ref 70–99)

## 2023-02-07 MED ORDER — FLUDEOXYGLUCOSE F - 18 (FDG) INJECTION
9.1400 | Freq: Once | INTRAVENOUS | Status: AC
  Administered 2023-02-07: 9.14 via INTRAVENOUS

## 2023-02-08 ENCOUNTER — Telehealth: Payer: Self-pay

## 2023-02-08 ENCOUNTER — Encounter: Payer: Self-pay | Admitting: Hematology

## 2023-02-08 ENCOUNTER — Inpatient Hospital Stay (HOSPITAL_BASED_OUTPATIENT_CLINIC_OR_DEPARTMENT_OTHER): Payer: PPO | Admitting: Hematology

## 2023-02-08 ENCOUNTER — Inpatient Hospital Stay: Payer: PPO

## 2023-02-08 ENCOUNTER — Other Ambulatory Visit: Payer: Self-pay

## 2023-02-08 VITALS — BP 171/75 | HR 72 | Temp 98.5°F | Resp 18 | Ht 63.0 in | Wt 170.7 lb

## 2023-02-08 DIAGNOSIS — C773 Secondary and unspecified malignant neoplasm of axilla and upper limb lymph nodes: Secondary | ICD-10-CM | POA: Diagnosis not present

## 2023-02-08 DIAGNOSIS — C50812 Malignant neoplasm of overlapping sites of left female breast: Secondary | ICD-10-CM | POA: Diagnosis not present

## 2023-02-08 DIAGNOSIS — C9110 Chronic lymphocytic leukemia of B-cell type not having achieved remission: Secondary | ICD-10-CM

## 2023-02-08 DIAGNOSIS — C50912 Malignant neoplasm of unspecified site of left female breast: Secondary | ICD-10-CM | POA: Diagnosis not present

## 2023-02-08 NOTE — Progress Notes (Signed)
Oley Balm, MD  Leodis Rains D PROCEDURE / BIOPSY REVIEW Date: 02/08/23  Requested Biopsy site: lumbar L3 Reason for request: met Imaging review: Best seen on PETCT  Decision: Approved Imaging modality to perform: IR Schedule with: Moderate Sedation Schedule for:   Me, or any spine (KP) rad  Additional comments: @VIR : right transpedical core biopsy PET+ lesion, correlate with PET, angle inferiorly toward lesion, use Kyphon biopsy set  Please contact me with questions, concerns, or if issue pertaining to this request arise.  Dayne Oley Balm, MD [facility]

## 2023-02-08 NOTE — Telephone Encounter (Signed)
Spoke with pt's husband regarding pt's concerns regarding nerve damage from biopsy.  Pt was currently sleeping at time of this RN's call and pt's husband did not want to wake pt.  Informed pt's husband that Dr. Mosetta Putt spoke with the IR Physician regarding pt's concerns of nerve damage from biopsy.  Stated that the IR Physician stated there low risk for nerve damage.  Pt's husband was glad to hear this information and stated that he and pt were very concerned at first but now are glad to hear this news.  Informed pt's husband that someone from our [employer] will be contact them to get the pt scheduled for the biopsy.  Pt's husband verbalized understanding and had no further questions or concerns.

## 2023-02-08 NOTE — Progress Notes (Signed)
[facility]   [phone] [phone]   Clinic New Consult Note   Patient Care Team: [name], MD as PCP - General (Family Medicine) Talmage Coin, MD as Attending Physician (Internal Medicine)  Date of Service:  02/08/2023   CHIEF COMPLAINTS/PURPOSE OF CONSULTATION:  Left Breast Cancer, ER+  REFERRING PHYSICIAN:  Dr. Bertis Ruddy    ASSESSMENT & PLAN:  LAURIEL Barnes is a 72 y.o. post-menopausal female with a history of   1. Invasive ductal carcinoma of the left breast, Stage IV, ([id number]), ER+/PR+/HER2 low expression, Grade 2, with bone metastasis  -Patient presented with a palpable left breast mass and enlarged left axillary lymph nodes in April 2024.  Previous mammogram 1 year ago was negative. -I reviewed her breast and left axillary lymph node biopsy, both showed grade 2 invasive ductal carcinoma, ER 100% and PR 20% strongly positive, HER2 IHC 2+, FISH negative. -I personally reviewed her PET scan images with patient and her husband, it showed hypermetabolic left breast, left axilla and subpectoral nodes, and multiple spinal bone mets.  -I recommend spinal bone lesion biopsy by IR. If not feasible, I will order Cerianna PET scan. She agrees.  -Due to her metastatic disease, upfront breast surgery is not recommended.  Patient was seen by breast surgeon Dr. Donell Beers today.  I spoke with Dr. Donell Beers. -I recommend first-line systemic therapy with aromatase inhibitor and CDK 4/6 inhibitor, such as Verzenio.  I discussed the benefit and potential side effect, she is agreeable. -I will see her back after her biopsy and or Cerianna PET scan, to finalize her treatment plan.    PLAN:  -recommend IR Biopsy of her spinal bone lesion, I spoke with Dr. Deanne Coffer  -f/u 2-3 days after biopsy  -recommend oral antiestrogen therapy Anastrozole /Verzenio, plan to call in on next visit      Oncology History Overview Note   Cancer Staging  Breast cancer  metastasized to axillary lymph node, left (HCC) Staging form: Breast, AJCC 8th Edition - Clinical stage from 02/01/2023: Stage IV (cT2, cN1(f), cM1, G2, ER+, PR+, HER2: Equivocal) - Signed by Malachy Mood, MD on 02/08/2023 Stage prefix: Initial diagnosis Method of lymph node assessment: Fine needle aspiration Histologic grading system: 3 grade system  CLL (chronic lymphocytic leukemia) (HCC) Staging form: Chronic Lymphocytic Leukemia / Small Lymphocytic Lymphoma, AJCC 8th Edition - Clinical stage from 08/24/2021: Modified Rai Stage I (Modified Rai risk: Intermediate, Lymphocytosis: Present, Adenopathy: Present, Organomegaly: Absent, Anemia: Absent, Thrombocytopenia: Absent) - Signed by Artis Delay, MD on 09/03/2022 Stage prefix: Initial diagnosis     CLL (chronic lymphocytic leukemia) (HCC)  06/12/2012 Initial Diagnosis   CLL (chronic lymphocytic leukemia)   07/20/2014 Pathology Results   FISH was normal   08/24/2021 Cancer Staging   Staging form: Chronic Lymphocytic Leukemia / Small Lymphocytic Lymphoma, AJCC 8th Edition - Clinical stage from 08/24/2021: Modified Rai Stage I (Modified Rai risk: Intermediate, Lymphocytosis: Present, Adenopathy: Present, Organomegaly: Absent, Anemia: Absent, Thrombocytopenia: Absent) - Signed by Artis Delay, MD on 09/03/2022 Stage prefix: Initial diagnosis   09/03/2022 Imaging   1. Isolated enlarged 16 mm short axis left axillary node. No other lymphadenopathy in the chest, abdomen, or pelvis. 2. 2.1 cm right thyroid nodule. Recommend thyroid US (ref: J Am Coll Radiol. 2015 Feb;12(2): 143-50). 3. Tiny bilateral nonobstructing renal stones.   Breast cancer metastasized to axillary lymph node, left (HCC)  01/23/2023 Imaging   IMPRESSION: 1. 2.3 cm mass in the 3 o'clock position of the left  breast with imaging features highly suspicious for primary breast cancer. 2. 3.1 cm markedly enlarged left axillary lymph node most likely representing a metastatic node  associated with the patient's probable primary breast cancer. A single enlarged lymph node associated with the patient's chronic lymphocytic leukemia is less likely.   RECOMMENDATION: 1. Ultrasound-guided core needle biopsy of the 2.3 cm mass in the 3 o'clock position of the left breast. 2. Ultrasound-guided core needle biopsy of the 3.1 cm enlarged left axillary lymph node. This has been discussed with the patient and her husband and the biopsies have been scheduled at 12:45 p.m. on 01/29/2023.   01/29/2023 Pathology Results   Patient: Kaitlin Barnes Collected: 01/29/2023 Client: [facility] Accession: [id number] Received: 01/29/2023 Amie Portland, MD DOB: [birth year] Age: 35 Gender: F Reported: 01/30/2023 [address] Patient Ph: [phone] MRN #: [id number] [place], [place] [address] Client Acc#: Chart #: [id number] Phone: [phone] Fax: CC: [facility] INTERNAL CC CC: [name] REPORT OF SURGICAL PATHOLOGY Addendum: Breast Biomarker Results FINAL DIAGNOSIS Diagnosis 1. Breast, left, needle core biopsy, 3 o'clock, mass, coil clip - INVASIVE MAMMARY CARCINOMA, SEE NOTE - MAMMARY CARCINOMA IN SITU, INTERMEDIATE GRADE - TUBULE FORMATION: SCORE 3 - NUCLEAR PLEOMORPHISM: SCORE 3 - MITOTIC COUNT: SCORE 1 - TOTAL SCORE: 7 - OVERALL GRADE: 2 - LYMPHOVASCULAR INVASION: NOT IDENTIFIED - CANCER LENGTH: 1.3 CM - CALCIFICATIONS: PRESENT, RARE - OTHER FINDINGS: INTRADUCTAL PAPILLOMA WITH ASSOCIATED MICROCALCIFICATION 2. Lymph node, needle/core biopsy, axilla, lymph node, hydromark clip - METASTATIC CARCINOMA - GREATEST DIMENSION: 1.2 CM - NO DEFINITIVE LYMPHOID TISSUE IDENTIFIED - SEE COMMENT Diagnosis Note 1. -2. Dr. Luisa Hart reviewed the case and concurs with the interpretation. Immunohistochemical staining for E-cadherin and a breast prognostic profile (ER, PR, Ki-67 and HER2) on block 1A is pending and will be reported in an addendum. Immunohistochemistry for  E-cadherin is positive consistent with ductal carcinoma.  1. Breast, left, needle core biopsy, 3 o'clock, mass, coil clip PROGNOSTIC INDICATORS Results: IMMUNOHISTOCHEMICAL AND MORPHOMETRIC ANALYSIS PERFORMED MANUALLY The tumor cells are EQUIVOCAL for Her2 (2+). Her2 by FISH will be performed and the results reported separately. Estrogen Receptor: 100%, POSITIVE, STRONG STAINING INTENSITY Progesterone Receptor: 20%, POSITIVE, STRONG STAINING INTENSITY Proliferation Marker Ki67: 45%    02/01/2023 Initial Diagnosis   Breast cancer metastasized to axillary lymph node, left (HCC)   02/01/2023 Cancer Staging   Staging form: Breast, AJCC 8th Edition - Clinical stage from 02/01/2023: Stage IV (cT2, cN1(f), cM1, G2, ER+, PR+, HER2: Equivocal) - Signed by Malachy Mood, MD on 02/08/2023 Stage prefix: Initial diagnosis Method of lymph node assessment: Fine needle aspiration Histologic grading system: 3 grade system   02/07/2023 PET scan   1. Hypermetabolic left breast mass consistent with known primary breast malignancy. 2. Hypermetabolic nodal metastases in the left axilla and subpectoral regions. 3. No evidence of metastatic disease within the abdomen or pelvis. 4. Several hypermetabolic osseous lesions in the thoracolumbar spine and right ribs, consistent with osseous metastatic disease. 5. Nonobstructing bilateral renal calculi.      HISTORY OF PRESENTING ILLNESS:  AISATOU PENNOYER 72 y.o. female is a here because of breast cancer. The patient was referred by Dr. Bertis Ruddy.The patient presents to the clinic today accompanied by Husband.   Pt felt the left breast lump herself. Pt state that it feels about the same. Pt didn't feel the lump in her  Lt axillary until today.   Biopsy of left breast mass and left axillary lymph node on 01/30/2023  both showed invasive ductal carcinoma, grade 2, ER 100% strongly positive, PR 20% positive, HER2 IHC 2+, negative by FISH.  Patient is clinically doing well.   Denies any new pain or other new symptoms.  Pt state that in the  month of March she  started having diarrhea for about a month.  Her diarrhea has improved intermittently, no significant abdominal pain, nausea or other GI symptoms.  She has good appetite, weight is stable,  She has a PMHx of.... Chronic lymphocytic leukemia Thyroid Disease Knee surgery Sister -breast cancer   Socially... Married     REVIEW OF SYSTEMS:    Constitutional: (-)Denies fevers, chills or abnormal night sweats Eyes:(-) Denies blurriness of vision, double vision or watery eyes Ears, nose, mouth, throat, and face: Denies mucositis or sore throat Respiratory: (-)Denies cough, dyspnea or wheezes Cardiovascular:(-)  Denies palpitation, chest discomfort or lower extremity swelling Gastrointestinal: (-)  Denies nausea, heartburn or change in bowel habits Skin: (-)Denies abnormal skin rashes Lymphatics:(-)  Denies new lymphadenopathy or easy bruising Neurological:(-) Denies numbness, tingling or new weaknesses Behavioral/Psych:(-)  Mood is stable, no new changes   All other systems were reviewed with the patient and are negative.   MEDICAL HISTORY:  Past Medical History:  Diagnosis Date   CLL (chronic lymphoblastic leukemia)    CLL (chronic lymphocytic leukemia) (HCC) 06/12/2012   Fever 03/28/2016   Hypercalcemia    Hyperparathyroidism, unspecified (HCC)    Hypothyroidism    Lung nodule 07/16/2013    SURGICAL HISTORY: Past Surgical History:  Procedure Laterality Date   BREAST BIOPSY Left    benign   BREAST BIOPSY Left 01/29/2023   times 2   BREAST BIOPSY Left 01/29/2023   [place] LT BREAST BX W LOC DEV 1ST LESION IMG BX SPEC US GUIDE 01/29/2023 [facility] MAMMOGR[facility]   KNEE ARTHROSCOPY Left    THYROIDECTOMY      SOCIAL HISTORY: Social History   Socioeconomic History   Marital status: Married    Spouse name: Not on file   Number of children: Not on file   Years of education: Not on file   Highest  education level: Not on file  Occupational History   Not on file  Tobacco Use   Smoking status: Never   Smokeless tobacco: Never  Substance and Sexual Activity   Alcohol use: No   Drug use: No   Sexual activity: Not on file  Other Topics Concern   Not on file  Social History Narrative   Not on file   Social Determinants of Health   Financial Resource Strain: Not on file  Food Insecurity: Not on file  Transportation Needs: Not on file  Physical Activity: Not on file  Stress: Not on file  Social Connections: Not on file  Intimate Partner Violence: Not on file    FAMILY HISTORY: Family History  Problem Relation Age of Onset   Breast cancer Sister    Cancer Sister 40       breast ca   Cancer Brother 68       prostate Ca    ALLERGIES:  has No Known Allergies.  MEDICATIONS:  Current Outpatient Medications  Medication Sig Dispense Refill   aspirin EC 81 MG tablet Take 81 mg by mouth daily. Swallow whole.     cholecalciferol (VITAMIN D) 1000 UNITS tablet Take 2,000 Units by mouth daily.     levothyroxine (SYNTHROID) 75 MCG tablet Take 75 mcg by mouth daily.     Multiple Vitamin (MULTIVITAMIN) tablet  Take 1 tablet by mouth daily.     omeprazole (PRILOSEC) 20 MG capsule Take 20 mg by mouth daily.      No current facility-administered medications for this visit.    PHYSICAL EXAMINATION: ECOG PERFORMANCE STATUS: 0 - Asymptomatic  Vitals:   02/08/23 1123  BP: (!) 171/75  Pulse: 72  Resp: 18  Temp: 98.5 F (36.9 C)  SpO2: 98%   Filed Weights   02/08/23 1123  Weight: 170 lb 11.2 oz (77.4 kg)     GENERAL:alert, no distress and comfortable SKIN: skin color normal, no rashes or significant lesions EYES: normal, Conjunctiva are pink and non-injected, sclera clear  NEURO: alert & oriented x 3 with fluent speech NECK: (-)supple, thyroid normal size, non-tender, without nodularity LYMPH:(-)  palpable lymphadenopathy in the cervical, (+)left axillary 3  cm ABDOMEN:(-) abdomen soft, (-) non-tender and (-) normal bowel sounds BREAST: Rt breast  No palpable mass, nodules or adenopathy bilaterally. Breast exam benign. LT breast 3 cm   LABORATORY DATA:  I have reviewed the data as listed    Latest Ref Rng & Units 02/01/2023   12:54 PM 08/24/2022    9:21 AM 08/24/2021    8:51 AM  CBC  WBC 4.0 - 10.5 K/uL 66.4  47.2  41.2   Hemoglobin 12.0 - 15.0 g/dL 16.1  09.6  04.5   Hematocrit 36.0 - 46.0 % 46.2  40.4  43.4   Platelets 150 - 400 K/uL 250  208  227        Latest Ref Rng & Units 02/01/2023   12:54 PM 08/30/2022    7:01 PM 04/30/2021    6:09 PM  CMP  Glucose 70 - 99 mg/dL 88   84   BUN 8 - 23 mg/dL 15   15   Creatinine 4.09 - 1.00 mg/dL 8.11  9.14  7.82   Sodium 135 - 145 mmol/L 141   143   Potassium 3.5 - 5.1 mmol/L 4.3   3.9   Chloride 98 - 111 mmol/L 107   109   CO2 22 - 32 mmol/L 30   27   Calcium 8.9 - 10.3 mg/dL 95.6   9.8   Total Protein 6.5 - 8.1 g/dL 7.1     Total Bilirubin 0.3 - 1.2 mg/dL 0.4     Alkaline Phos 38 - 126 U/L 99     AST 15 - 41 U/L 19     ALT 0 - 44 U/L 21        RADIOGRAPHIC STUDIES: I have personally reviewed the radiological images as listed and agreed with the findings in the report. NM PET Image Initial (PI) Skull Base To Thigh (F-18 FDG)  Result Date: 02/07/2023 CLINICAL DATA:  Initial treatment strategy for invasive left breast cancer. EXAM: NUCLEAR MEDICINE PET SKULL BASE TO THIGH TECHNIQUE: 9.14 mCi F-18 FDG was injected intravenously. Full-ring PET imaging was performed from the skull base to thigh after the radiotracer. CT data was obtained and used for attenuation correction and anatomic localization. Fasting blood glucose: 91 mg/dl COMPARISON:  Recent breast imaging. CT of the chest, abdomen and pelvis 08/30/2022. FINDINGS: Mediastinal blood pool activity: SUV max 1.8 NECK: No hypermetabolic cervical lymph nodes are identified. No suspicious activity identified within the pharyngeal mucosal space.  Incidental CT findings: Patient appears to be status post left thyroidectomy. CHEST: Left lateral breast mass is hypermetabolic with an SUV max of 11.0. This mass measures 2.4 cm and contains a biopsy clip. Dominant left  axillary lymph node has mildly enlarged, measuring 3.0 x 2.1 cm on image 56/4 and is hypermetabolic with an SUV max of 13.7. There are several small hypermetabolic subpectoral lymph nodes on the left. No hypermetabolic mediastinal, hilar or internal mammary lymph nodes. No hypermetabolic pulmonary activity or suspicious nodularity. Incidental CT findings: Stable tiny right middle lobe subpleural nodule on image 68/4 consistent with a benign finding. ABDOMEN/PELVIS: There is no hypermetabolic activity within the liver, adrenal glands, spleen or pancreas. There is no hypermetabolic nodal activity in the abdomen or pelvis. Incidental CT findings: Small nonobstructing bilateral renal calculi. No evidence of ureteral calculus or hydronephrosis. SKELETON: There are several hypermetabolic osseous lesions concerning for metastatic disease. The most worrisome lesions are in the L3 vertebral body (SUV max 10.6) and the T11 vertebral body (SUV max 6.8). There are other smaller lesions in the L2 vertebral body and within the right 1st and 4th ribs. No pathologic fractures identified. Incidental CT findings: Multilevel spondylosis with a degenerative anterolisthesis at L4-5. Asymmetric left common hamstring tendinosis noted. IMPRESSION: 1. Hypermetabolic left breast mass consistent with known primary breast malignancy. 2. Hypermetabolic nodal metastases in the left axilla and subpectoral regions. 3. No evidence of metastatic disease within the abdomen or pelvis. 4. Several hypermetabolic osseous lesions in the thoracolumbar spine and right ribs, consistent with osseous metastatic disease. 5. Nonobstructing bilateral renal calculi. Electronically Signed   By: Carey Bullocks M.D.   On: 02/07/2023 12:27   [place] LT  BREAST BX W LOC DEV 1ST LESION IMG BX SPEC US GUIDE  Addendum Date: 01/30/2023   ADDENDUM REPORT: 01/30/2023 13:40 ADDENDUM: PATHOLOGY revealed: Site 1. Breast, LEFT, needle core biopsy, 3 o'clock, mass, coil clip - INVASIVE MAMMARY CARCINOMA, - MAMMARY CARCINOMA IN SITU, INTERMEDIATE GRADE - OVERALL GRADE: 2 -LYMPHOVASCULAR INVASION: NOT IDENTIFIED - CALCIFICATIONS: PRESENT, RARE - OTHER FINDINGS: INTRADUCTAL PAPILLOMA WITH ASSOCIATED MICROCALCIFICATION. Pathology results are CONCORDANT with imaging findings, per Dr. Amie Portland. PATHOLOGY revealed: Site 2. Lymph node, needle/core biopsy, axilla, lymph node, hydromark clip - METASTATIC CARCINOMA - NO DEFINITIVE LYMPHOID TISSUE IDENTIFIED. Pathology results are CONCORDANT with imaging findings, per Dr. Amie Portland. Pathology results and recommendations below were discussed with patient by telephone on 01/30/2023. Patient reported biopsy site within normal limits with slight tenderness at the site. Post biopsy care instructions were reviewed, questions were answered and my direct phone number was provided to patient. Patient was instructed to call [facility] if any concerns or questions arise related to the biopsy. RECOMMENDATION: Patient sees Dr. Artis Delay at one [facility] and has an appointment with her this Friday, Feb 01, 2023 at 12:00. She wishes to discuss surgical consultation with Dr. Bertis Ruddy during this visit. Dr.Gorsuch's office notified. Pathology results reported by Donell Sievert, RN on 01/30/2023. Electronically Signed   By: Amie Portland M.D.   On: 01/30/2023 13:40   Result Date: 01/30/2023 CLINICAL DATA:  Patient presents for ultrasound-guided core needle biopsy of a left breast mass and an enlarged left axillary lymph node. EXAM: ULTRASOUND GUIDED LEFT BREAST CORE NEEDLE BIOPSY ULTRASOUND-GUIDED LEFT AXILLARY LYMPH NODE CORE NEEDLE BIOPSY COMPARISON:  Previous exam(s). PROCEDURE: I met with the patient  and we discussed the procedure of ultrasound-guided biopsy, including benefits and alternatives. We discussed the high likelihood of a successful procedure. We discussed the risks of the procedure, including infection, bleeding, tissue injury, clip migration, and inadequate sampling. Informed written consent was given. The usual time-out protocol was performed immediately prior  to the procedure. Biopsy #1: Left breast mass. Lesion quadrant: Upper outer quadrant: Near 3 o'clock, 5 cm the nipple. Using sterile technique and 1% Lidocaine as local anesthetic, under direct ultrasound visualization, a 12 gauge spring-loaded device was used to perform biopsy of the 2.3 cm mass at 3 o'clock using an inferior approach. At the conclusion of the procedure a coil shaped tissue marker clip was deployed into the biopsy cavity. Biopsy #1: Left axillary lymph node. Lesion location: Left axilla. Using sterile technique and 1% Lidocaine as local anesthetic, under direct ultrasound visualization, a 14 gauge spring-loaded device was used to perform biopsy of the enlarged left axillary lymph node using an inferolateral approach. At the conclusion of the procedure a HydroMARK tissue marker clip was deployed into the biopsy cavity. Follow up 2 view mammogram was performed and dictated separately. IMPRESSION: Ultrasound guided biopsy of left breast mass and enlarged left axillary lymph node. No apparent complications. Electronically Signed: By: Amie Portland M.D. On: 01/29/2023 13:35  [place] AXILLARY NODE CORE BIOPSY LEFT  Addendum Date: 01/30/2023   ADDENDUM REPORT: 01/30/2023 13:40 ADDENDUM: PATHOLOGY revealed: Site 1. Breast, LEFT, needle core biopsy, 3 o'clock, mass, coil clip - INVASIVE MAMMARY CARCINOMA, - MAMMARY CARCINOMA IN SITU, INTERMEDIATE GRADE - OVERALL GRADE: 2 -LYMPHOVASCULAR INVASION: NOT IDENTIFIED - CALCIFICATIONS: PRESENT, RARE - OTHER FINDINGS: INTRADUCTAL PAPILLOMA WITH ASSOCIATED MICROCALCIFICATION. Pathology results  are CONCORDANT with imaging findings, per Dr. Amie Portland. PATHOLOGY revealed: Site 2. Lymph node, needle/core biopsy, axilla, lymph node, hydromark clip - METASTATIC CARCINOMA - NO DEFINITIVE LYMPHOID TISSUE IDENTIFIED. Pathology results are CONCORDANT with imaging findings, per Dr. Amie Portland. Pathology results and recommendations below were discussed with patient by telephone on 01/30/2023. Patient reported biopsy site within normal limits with slight tenderness at the site. Post biopsy care instructions were reviewed, questions were answered and my direct phone number was provided to patient. Patient was instructed to call [facility] if any concerns or questions arise related to the biopsy. RECOMMENDATION: Patient sees Dr. Artis Delay at one [facility] and has an appointment with her this Friday, Feb 01, 2023 at 12:00. She wishes to discuss surgical consultation with Dr. Bertis Ruddy during this visit. Dr.Gorsuch's office notified. Pathology results reported by Donell Sievert, RN on 01/30/2023. Electronically Signed   By: Amie Portland M.D.   On: 01/30/2023 13:40   Result Date: 01/30/2023 CLINICAL DATA:  Patient presents for ultrasound-guided core needle biopsy of a left breast mass and an enlarged left axillary lymph node. EXAM: ULTRASOUND GUIDED LEFT BREAST CORE NEEDLE BIOPSY ULTRASOUND-GUIDED LEFT AXILLARY LYMPH NODE CORE NEEDLE BIOPSY COMPARISON:  Previous exam(s). PROCEDURE: I met with the patient and we discussed the procedure of ultrasound-guided biopsy, including benefits and alternatives. We discussed the high likelihood of a successful procedure. We discussed the risks of the procedure, including infection, bleeding, tissue injury, clip migration, and inadequate sampling. Informed written consent was given. The usual time-out protocol was performed immediately prior to the procedure. Biopsy #1: Left breast mass. Lesion quadrant: Upper outer quadrant: Near 3  o'clock, 5 cm the nipple. Using sterile technique and 1% Lidocaine as local anesthetic, under direct ultrasound visualization, a 12 gauge spring-loaded device was used to perform biopsy of the 2.3 cm mass at 3 o'clock using an inferior approach. At the conclusion of the procedure a coil shaped tissue marker clip was deployed into the biopsy cavity. Biopsy #1: Left axillary lymph node. Lesion location: Left axilla. Using sterile technique and  1% Lidocaine as local anesthetic, under direct ultrasound visualization, a 14 gauge spring-loaded device was used to perform biopsy of the enlarged left axillary lymph node using an inferolateral approach. At the conclusion of the procedure a HydroMARK tissue marker clip was deployed into the biopsy cavity. Follow up 2 view mammogram was performed and dictated separately. IMPRESSION: Ultrasound guided biopsy of left breast mass and enlarged left axillary lymph node. No apparent complications. Electronically Signed: By: Amie Portland M.D. On: 01/29/2023 13:35  MM CLIP PLACEMENT LEFT  Result Date: 01/29/2023 CLINICAL DATA:  Assessed post biopsy marker clip placements following ultrasound-guided core needle biopsy of a left breast mass and a left axilla lymph node. EXAM: 3D DIAGNOSTIC LEFT MAMMOGRAM POST ULTRASOUND BIOPSY COMPARISON:  Previous exam(s). FINDINGS: 3D Mammographic images were obtained following ultrasound guided biopsy of left mass and a left axilla lymph. The coil shaped biopsy clip lies within the mass the upper outer left breast. The spiral HydroMARK clip lies within the enlarged left axillary lymph nodes. IMPRESSION: Appropriate positioning of the coil shaped and HydroMARK biopsy marking clips at the site of biopsy in the left breast and left axilla as detailed above. Final Assessment: Post Procedure Mammograms for Marker Placement Electronically Signed   By: Amie Portland M.D.   On: 01/29/2023 14:00  MM 3D DIAGNOSTIC MAMMOGRAM BILATERAL BREAST  Result  Date: 01/23/2023 CLINICAL DATA:  New mass felt by the patient in the upper outer left breast for the past 2-3 months. She has chronic lymphocytic leukemia. Family history of breast cancer in her sister. EXAM: DIGITAL DIAGNOSTIC BILATERAL MAMMOGRAM WITH TOMOSYNTHESIS; ULTRASOUND LEFT BREAST LIMITED TECHNIQUE: Bilateral digital diagnostic mammography and breast tomosynthesis was performed.; Targeted ultrasound examination of the left breast was performed. COMPARISON:  Previous exam(s). ACR Breast Density Category c: The breasts are heterogeneously dense, which may obscure small masses. FINDINGS: There is a mildly irregular mass in the upper-outer left breast posteriorly in the region of the mass felt by the patient, marked with a metallic marker. This has indistinct margins and contains microcalcifications. There is an oval, circumscribed mass in the medial retroareolar/periareolar left breast. No interval findings suspicious for malignancy in the right breast. On physical exam, there is a faintly palpable mass measuring approximately 2.5 cm in the 3 o'clock position of the left breast, 5 cm from the nipple. There are no palpable left axillary lymph nodes with the patient upright. Targeted ultrasound is performed, showing 2.3 x 2.2 x 1.2 cm irregular, hypoechoic mass containing calcifications and some medium echotexture portions. This has indistinct margins with ill-defined surrounding increased echogenicity and is located in the 3 o'clock position, 5 cm from the nipple. This corresponds to the palpable mass in the mass seen mammographically in this area. A 2.5 cm simple cyst is demonstrated in the 10 o'clock position of the left breast, 2 cm from the nipple, corresponding to the 2nd mammographic mass. A single markedly enlarged left axillary lymph node is demonstrated measuring 3.1 x 3.0 x 1.5 cm with obliteration of the normal fatty hilum. IMPRESSION: 1. 2.3 cm mass in the 3 o'clock position of the left breast with  imaging features highly suspicious for primary breast cancer. 2. 3.1 cm markedly enlarged left axillary lymph node most likely representing a metastatic node associated with the patient's probable primary breast cancer. A single enlarged lymph node associated with the patient's chronic lymphocytic leukemia is less likely. RECOMMENDATION: 1. Ultrasound-guided core needle biopsy of the 2.3 cm mass in the 3 o'clock position of  the left breast. 2. Ultrasound-guided core needle biopsy of the 3.1 cm enlarged left axillary lymph node. This has been discussed with the patient and her husband and the biopsies have been scheduled at 12:45 p.m. on 01/29/2023. I have discussed the findings and recommendations with the patient. If applicable, a reminder letter will be sent to the patient regarding the next appointment. BI-RADS CATEGORY  5: Highly suggestive of malignancy. Electronically Signed   By: Beckie Salts M.D.   On: 01/23/2023 15:25  [place] LIMITED ULTRASOUND INCLUDING AXILLA LEFT BREAST   Result Date: 01/23/2023 CLINICAL DATA:  New mass felt by the patient in the upper outer left breast for the past 2-3 months. She has chronic lymphocytic leukemia. Family history of breast cancer in her sister. EXAM: DIGITAL DIAGNOSTIC BILATERAL MAMMOGRAM WITH TOMOSYNTHESIS; ULTRASOUND LEFT BREAST LIMITED TECHNIQUE: Bilateral digital diagnostic mammography and breast tomosynthesis was performed.; Targeted ultrasound examination of the left breast was performed. COMPARISON:  Previous exam(s). ACR Breast Density Category c: The breasts are heterogeneously dense, which may obscure small masses. FINDINGS: There is a mildly irregular mass in the upper-outer left breast posteriorly in the region of the mass felt by the patient, marked with a metallic marker. This has indistinct margins and contains microcalcifications. There is an oval, circumscribed mass in the medial retroareolar/periareolar left breast. No interval findings suspicious for  malignancy in the right breast. On physical exam, there is a faintly palpable mass measuring approximately 2.5 cm in the 3 o'clock position of the left breast, 5 cm from the nipple. There are no palpable left axillary lymph nodes with the patient upright. Targeted ultrasound is performed, showing 2.3 x 2.2 x 1.2 cm irregular, hypoechoic mass containing calcifications and some medium echotexture portions. This has indistinct margins with ill-defined surrounding increased echogenicity and is located in the 3 o'clock position, 5 cm from the nipple. This corresponds to the palpable mass in the mass seen mammographically in this area. A 2.5 cm simple cyst is demonstrated in the 10 o'clock position of the left breast, 2 cm from the nipple, corresponding to the 2nd mammographic mass. A single markedly enlarged left axillary lymph node is demonstrated measuring 3.1 x 3.0 x 1.5 cm with obliteration of the normal fatty hilum. IMPRESSION: 1. 2.3 cm mass in the 3 o'clock position of the left breast with imaging features highly suspicious for primary breast cancer. 2. 3.1 cm markedly enlarged left axillary lymph node most likely representing a metastatic node associated with the patient's probable primary breast cancer. A single enlarged lymph node associated with the patient's chronic lymphocytic leukemia is less likely. RECOMMENDATION: 1. Ultrasound-guided core needle biopsy of the 2.3 cm mass in the 3 o'clock position of the left breast. 2. Ultrasound-guided core needle biopsy of the 3.1 cm enlarged left axillary lymph node. This has been discussed with the patient and her husband and the biopsies have been scheduled at 12:45 p.m. on 01/29/2023. I have discussed the findings and recommendations with the patient. If applicable, a reminder letter will be sent to the patient regarding the next appointment. BI-RADS CATEGORY  5: Highly suggestive of malignancy. Electronically Signed   By: Beckie Salts M.D.   On: 01/23/2023 15:25     Orders Placed This Encounter  Procedures   CT Biopsy    Standing Status:   Future    Standing Expiration Date:   02/08/2024    Order Specific Question:   Lab orders requested (DO NOT place separate lab orders, these will be automatically  ordered during procedure specimen collection):    Answer:   Surgical Pathology    Order Specific Question:   Reason for Exam (SYMPTOM  OR DIAGNOSIS REQUIRED)    Answer:   confirm metastatic cancer    Order Specific Question:   Preferred location?    Answer:   [facility]    All questions were answered. The patient knows to call the clinic with any problems, questions or concerns. The total time spent in the appointment was 60 minutes.     Malachy Mood, MD 02/08/2023 2:14 PM  I, Monica Martinez, am acting as scribe for Malachy Mood, MD.   I have reviewed the above documentation for accuracy and completeness, and I agree with the above.

## 2023-02-11 ENCOUNTER — Encounter: Payer: Self-pay | Admitting: *Deleted

## 2023-02-11 ENCOUNTER — Telehealth: Payer: Self-pay | Admitting: Genetic Counselor

## 2023-02-11 ENCOUNTER — Telehealth: Payer: Self-pay

## 2023-02-11 NOTE — Telephone Encounter (Signed)
Returned pt's husband's call (voicemail message) regarding the cost of the [employer] and the cost.  Spoke with pt on returned call.  Informed pt that her husband had called stating he's been researching the cost of the [employer] and wants to speak with the people about seeing if they could get a lower cost for this medication.  This RN spoke with pt's husband on Friday 02/08/2023 when called to inform pt regarding pt's concerns regarding nerve damage d/t bx biopsy.  Pt's husband asked then during this call and he was informed then that the medication will be submitted to the insurance and if there is an expensive co-pay then Dr. Mosetta Putt and our [facility] Staff will see if there is any patient assistance program/copay assistance for this medication.  Stated to pt that I spoke with her husband on Friday 02/08/2023 regarding this medication.  Stated that Dr. Mosetta Putt has not prescribed this medication and is most likely waiting for the pt to complete her biopsy before prescribing Verzenio.  Pt stated she was not aware that her husband had called Dr. Latanya Maudlin office today.  Pt verbalized understanding of medication if and when its prescribed will be billed against pt's insurance and if copay is expensive either an alternative medication maybe prescribed if NO copay assistance program is available for this medication.  Pt had no further questions or concerns at this time.

## 2023-02-12 ENCOUNTER — Inpatient Hospital Stay: Payer: PPO

## 2023-02-12 ENCOUNTER — Inpatient Hospital Stay (HOSPITAL_BASED_OUTPATIENT_CLINIC_OR_DEPARTMENT_OTHER): Payer: PPO | Admitting: Genetic Counselor

## 2023-02-12 ENCOUNTER — Other Ambulatory Visit: Payer: Self-pay

## 2023-02-12 DIAGNOSIS — C9110 Chronic lymphocytic leukemia of B-cell type not having achieved remission: Secondary | ICD-10-CM

## 2023-02-12 DIAGNOSIS — Z8042 Family history of malignant neoplasm of prostate: Secondary | ICD-10-CM

## 2023-02-12 DIAGNOSIS — Z803 Family history of malignant neoplasm of breast: Secondary | ICD-10-CM | POA: Diagnosis not present

## 2023-02-12 DIAGNOSIS — C773 Secondary and unspecified malignant neoplasm of axilla and upper limb lymph nodes: Secondary | ICD-10-CM

## 2023-02-12 DIAGNOSIS — C50912 Malignant neoplasm of unspecified site of left female breast: Secondary | ICD-10-CM

## 2023-02-12 DIAGNOSIS — C50812 Malignant neoplasm of overlapping sites of left female breast: Secondary | ICD-10-CM

## 2023-02-12 DIAGNOSIS — Z8041 Family history of malignant neoplasm of ovary: Secondary | ICD-10-CM

## 2023-02-13 ENCOUNTER — Encounter: Payer: Self-pay | Admitting: Genetic Counselor

## 2023-02-13 ENCOUNTER — Other Ambulatory Visit: Payer: Self-pay | Admitting: Student

## 2023-02-13 DIAGNOSIS — Z803 Family history of malignant neoplasm of breast: Secondary | ICD-10-CM | POA: Insufficient documentation

## 2023-02-13 DIAGNOSIS — Z8041 Family history of malignant neoplasm of ovary: Secondary | ICD-10-CM | POA: Insufficient documentation

## 2023-02-13 DIAGNOSIS — C9110 Chronic lymphocytic leukemia of B-cell type not having achieved remission: Secondary | ICD-10-CM

## 2023-02-13 DIAGNOSIS — Z8042 Family history of malignant neoplasm of prostate: Secondary | ICD-10-CM | POA: Insufficient documentation

## 2023-02-13 NOTE — Progress Notes (Signed)
Patient will undergo a skin biopsy for genetic testing on 02/14/2023 in the IR by Dr. Sophronia Simas.

## 2023-02-13 NOTE — Progress Notes (Addendum)
REFERRING PROVIDER: Malachy Mood, MD [address] [place],  [place] [address]  PRIMARY PROVIDER:  [name], MD  PRIMARY REASON FOR VISIT:  1. Family history of breast cancer   2. Family history of ovarian cancer   3. Family history of prostate cancer   4. Breast cancer metastasized to axillary lymph node, left (HCC)   5. CLL (chronic lymphocytic leukemia) (HCC)      HISTORY OF PRESENT ILLNESS:   Kaitlin Barnes, a 72 y.o. female, was seen for a [facility] cancer genetics consultation at the request of Dr. Mosetta Putt due to a personal and family history of cancer.  Kaitlin Barnes presents to clinic today to discuss the possibility of a hereditary predisposition to cancer, genetic testing, and to further clarify her future cancer risks, as well as potential cancer risks for family members.   In May 2024, at the age of 31, Kaitlin Barnes was diagnosed with Stage IV cancer of the left breast.      CANCER HISTORY:  Oncology History Overview Note   Cancer Staging  Breast cancer metastasized to axillary lymph node, left (HCC) Staging form: Breast, AJCC 8th Edition - Clinical stage from 02/01/2023: Stage IV (cT2, cN1(f), cM1, G2, ER+, PR+, HER2: Equivocal) - Signed by Malachy Mood, MD on 02/08/2023 Stage prefix: Initial diagnosis Method of lymph node assessment: Fine needle aspiration Histologic grading system: 3 grade system  CLL (chronic lymphocytic leukemia) (HCC) Staging form: Chronic Lymphocytic Leukemia / Small Lymphocytic Lymphoma, AJCC 8th Edition - Clinical stage from 08/24/2021: Modified Rai Stage I (Modified Rai risk: Intermediate, Lymphocytosis: Present, Adenopathy: Present, Organomegaly: Absent, Anemia: Absent, Thrombocytopenia: Absent) - Signed by Artis Delay, MD on 09/03/2022 Stage prefix: Initial diagnosis     CLL (chronic lymphocytic leukemia) (HCC)  06/12/2012 Initial Diagnosis   CLL (chronic lymphocytic leukemia)   07/20/2014 Pathology Results   FISH was normal    08/24/2021 Cancer Staging   Staging form: Chronic Lymphocytic Leukemia / Small Lymphocytic Lymphoma, AJCC 8th Edition - Clinical stage from 08/24/2021: Modified Rai Stage I (Modified Rai risk: Intermediate, Lymphocytosis: Present, Adenopathy: Present, Organomegaly: Absent, Anemia: Absent, Thrombocytopenia: Absent) - Signed by Artis Delay, MD on 09/03/2022 Stage prefix: Initial diagnosis   09/03/2022 Imaging   1. Isolated enlarged 16 mm short axis left axillary node. No other lymphadenopathy in the chest, abdomen, or pelvis. 2. 2.1 cm right thyroid nodule. Recommend thyroid US (ref: J Am Coll Radiol. 2015 Feb;12(2): 143-50). 3. Tiny bilateral nonobstructing renal stones.   Breast cancer metastasized to axillary lymph node, left (HCC)  01/23/2023 Imaging   IMPRESSION: 1. 2.3 cm mass in the 3 o'clock position of the left breast with imaging features highly suspicious for primary breast cancer. 2. 3.1 cm markedly enlarged left axillary lymph node most likely representing a metastatic node associated with the patient's probable primary breast cancer. A single enlarged lymph node associated with the patient's chronic lymphocytic leukemia is less likely.   RECOMMENDATION: 1. Ultrasound-guided core needle biopsy of the 2.3 cm mass in the 3 o'clock position of the left breast. 2. Ultrasound-guided core needle biopsy of the 3.1 cm enlarged left axillary lymph node. This has been discussed with the patient and her husband and the biopsies have been scheduled at 12:45 p.m. on 01/29/2023.   01/29/2023 Pathology Results   Patient: Kaitlin Barnes Collected: 01/29/2023 Client: [facility] Accession: [id number] Received: 01/29/2023 Amie Portland, MD DOB: 1950-11-19 Age: 57 Gender: F Reported: 01/30/2023 [address] Patient  Ph: [phone] MRN #: [id number] [place], [place] [address] Client Acc#: Chart #: [id number] Phone: [phone] Fax: CC: [facility] INTERNAL CC CC: [name] REPORT OF SURGICAL PATHOLOGY Addendum: Breast Biomarker Results FINAL DIAGNOSIS Diagnosis 1. Breast, left, needle core biopsy, 3 o'clock, mass, coil clip - INVASIVE MAMMARY CARCINOMA, SEE NOTE - MAMMARY CARCINOMA IN SITU, INTERMEDIATE GRADE - TUBULE FORMATION: SCORE 3 - NUCLEAR PLEOMORPHISM: SCORE 3 - MITOTIC COUNT: SCORE 1 - TOTAL SCORE: 7 - OVERALL GRADE: 2 - LYMPHOVASCULAR INVASION: NOT IDENTIFIED - CANCER LENGTH: 1.3 CM - CALCIFICATIONS: PRESENT, RARE - OTHER FINDINGS: INTRADUCTAL PAPILLOMA WITH ASSOCIATED MICROCALCIFICATION 2. Lymph node, needle/core biopsy, axilla, lymph node, hydromark clip - METASTATIC CARCINOMA - GREATEST DIMENSION: 1.2 CM - NO DEFINITIVE LYMPHOID TISSUE IDENTIFIED - SEE COMMENT Diagnosis Note 1. -2. Dr. Luisa Hart reviewed the case and concurs with the interpretation. Immunohistochemical staining for E-cadherin and a breast prognostic profile (ER, PR, Ki-67 and HER2) on block 1A is pending and will be reported in an addendum. 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  1. Breast, left, needle core biopsy, 3 o'clock, mass, coil clip PROGNOSTIC INDICATORS Results: IMMUNOHISTOCHEMICAL AND MORPHOMETRIC ANALYSIS PERFORMED MANUALLY The tumor cells are EQUIVOCAL for Her2 (2+). Her2 by FISH will be performed and the results reported separately. Estrogen Receptor: 100%, POSITIVE, STRONG STAINING INTENSITY Progesterone Receptor: 20%, POSITIVE, STRONG STAINING INTENSITY Proliferation Marker Ki67: 45%    02/01/2023 Initial Diagnosis   Breast cancer metastasized to axillary lymph node, left (HCC)   02/01/2023 Cancer Staging   Staging form: Breast, AJCC 8th Edition - Clinical stage from 02/01/2023: Stage IV (cT2, cN1(f), cM1, G2, ER+, PR+, HER2: Equivocal) - Signed by Malachy Mood, MD on 02/08/2023 Stage prefix: Initial diagnosis Method of lymph node assessment: Fine needle aspiration Histologic grading system: 3 grade  system   02/07/2023 PET scan   1. Hypermetabolic left breast mass consistent with known primary breast malignancy. 2. Hypermetabolic nodal metastases in the left axilla and subpectoral regions. 3. No evidence of metastatic disease within the abdomen or pelvis. 4. Several hypermetabolic osseous lesions in the thoracolumbar spine and right ribs, consistent with osseous metastatic disease. 5. Nonobstructing bilateral renal calculi.     Past Medical History:  Diagnosis Date   CLL (chronic lymphoblastic leukemia)    CLL (chronic lymphocytic leukemia) (HCC) 06/12/2012   Family history of breast cancer    Family history of ovarian cancer    Family history of prostate cancer    Fever 03/28/2016   Hypercalcemia    Hyperparathyroidism, unspecified (HCC)    Hypothyroidism    Lung nodule 07/16/2013    Past Surgical History:  Procedure Laterality Date   BREAST BIOPSY Left    benign   BREAST BIOPSY Left 01/29/2023   times 2   BREAST BIOPSY Left 01/29/2023   [place] LT BREAST BX W LOC DEV 1ST LESION IMG BX SPEC US GUIDE 01/29/2023 [facility]   KNEE ARTHROSCOPY Left    THYROIDECTOMY      Social History   Socioeconomic History   Marital status: Married    Spouse name: Not on file   Number of children: Not on file   Years of education: Not on file   Highest education level: Not on file  Occupational History   Not on file  Tobacco Use   Smoking status: Never   Smokeless tobacco: Never  Substance and Sexual Activity   Alcohol use: No   Drug use: No   Sexual activity: Not on file  Other Topics Concern   Not on file  Social History Narrative   Not on file   Social Determinants of Health   Financial Resource Strain: Not on file  Food Insecurity: Not on file  Transportation Needs: Not on file  Physical Activity: Not on file  Stress: Not on file  Social Connections: Not on file     FAMILY HISTORY:  We obtained a detailed, 4-generation family history.  Significant diagnoses  are listed below: Family History  Problem Relation Age of Onset   Breast cancer Mother        dx. 60s   Prostate cancer Father    Breast cancer Sister 50   Prostate cancer Brother 61   Multiple myeloma Brother    Ovarian cancer Paternal Aunt 12   Breast cancer Paternal Aunt    Ovarian cancer Paternal Aunt    Cancer Paternal Uncle        unknown   Breast cancer Maternal Grandmother        dx. 60s-70s   Breast cancer Paternal Grandmother    Ovarian cancer Paternal Grandmother      The patient has a son and daughter who are cancer free.  She has a sister who was diagnosed with breast cancer around age 33, a brother who was diagnosed with prostate cancer at 38 and another brother with multiple myeloma.  Both parents are deceased.  The patient's mother was diagnosed with breast cancer in her 71's. She had multiple siblings who were reported to be cancer free.  The maternal grandmother was diagnosed with breast cancer in her 19's.  The patient's father died of prostate cancer at 76.  He had two sister and two brothers.  On sister had ovarian cancer, another sister had breast and ovarian cancer and a brother had an unknown cancer.  The paternal grandparents are deceased.  The grandmother had breast and ovarian cancer.  Ms. Doorn is unaware of previous family history of genetic testing for hereditary cancer risks. Patient's maternal ancestors are of Caucasian descent, and paternal ancestors are of [place] descent. There is no reported Ashkenazi Jewish ancestry. There is no known consanguinity.  GENETIC COUNSELING ASSESSMENT: Ms. Scolaro is a 72 y.o. female with a personal and family history of breast cancer which is somewhat suggestive of a hereditary cancer syndrome and predisposition to cancer given the combination of cancer in the family, and the young age of onset in some family members. We, therefore, discussed and recommended the following at today's visit.   DISCUSSION: We discussed  that, in general, most cancer is not inherited in families, but instead is sporadic or familial. Sporadic cancers occur by chance and typically happen at older ages (>50 years) as this type of cancer is caused by genetic changes acquired during an individual's lifetime. Some families have more cancers than would be expected by chance; however, the ages or types of cancer are not consistent with a known genetic mutation or known genetic mutations have been ruled out. This type of familial cancer is thought to be due to a combination of multiple genetic, environmental, hormonal, and lifestyle factors. While this combination of factors likely increases the risk of cancer, the exact source of this risk is not currently identifiable or testable.  We discussed that 5 - 10% of breast cancer is hereditary, with most cases associated with BRCA mutations.  There are other genes that can be associated with hereditary breast cancer syndromes.  These include ATM, CHEK2 and PALB2.  We  discussed that testing is beneficial for several reasons including knowing how to follow individuals after completing their treatment, identifying whether potential treatment options such as PARP inhibitors would be beneficial, and understand if other family members could be at risk for cancer and allow them to undergo genetic testing.   We reviewed the characteristics, features and inheritance patterns of hereditary cancer syndromes. We also discussed genetic testing, including the appropriate family members to test, the process of testing, insurance coverage and turn-around-time for results. We discussed the implications of a negative, positive, carrier and/or variant of uncertain significant result. Ms. Clinesmith  was offered a common hereditary cancer panel (47 genes) and an expanded pan-cancer panel (77 genes). Ms. Wearing was informed of the benefits and limitations of each panel, including that expanded pan-cancer panels contain genes that do  not have clear management guidelines at this point in time.  We also discussed that as the number of genes included on a panel increases, the chances of variants of uncertain significance increases. Ms. Losi decided to pursue genetic testing for the CancerNext-Expanded gene panel.   The CancerNext-Expanded gene panel offered by [employer] and includes sequencing and rearrangement analysis for the following 71 genes: AIP, ALK, APC, ATM, BAP1, BARD1, BMPR1A, BRCA1, BRCA2, BRIP1, CDC73, CDH1, CDK4, CDKN1B, CDKN2A, CHEK2, DICER1, FH, FLCN, KIF1B, LZTR1, MAX, MEN1, MET, MLH1, MSH2, MSH6, MUTYH, NF1, NF2, NTHL1, PALB2, PHOX2B, PMS2, POT1, PRKAR1A, PTCH1, PTEN, RAD51C, RAD51D, RB1, RET, SDHA, SDHAF2, SDHB, SDHC, SDHD, SMAD4, SMARCA4, SMARCB1, SMARCE1, STK11, SUFU, TMEM127, TP53, TSC1, TSC2 and VHL (sequencing and deletion/duplication); AXIN2, CTNNA1, EGFR, EGLN1, HOXB13, KIT, MITF, MSH3, PDGFRA, POLD1 and POLE (sequencing only); EPCAM and GREM1 (deletion/duplication only). RNA data is routinely analyzed for use in variant interpretation for all genes.   Based on Ms. Routzahn's personal and family history of cancer, she meets medical criteria for genetic testing. Despite that she meets criteria, she may still have an out of pocket cost. We discussed that if her out of pocket cost for testing is over $100, the laboratory will call and confirm whether she wants to proceed with testing.  If the out of pocket cost of testing is less than $100 she will be billed by the genetic testing laboratory.   We discussed that some people do not want to undergo genetic testing due to fear of genetic discrimination.  The Genetic Information Nondiscrimination Act (GINA) was signed into federal law in 2008. GINA prohibits health insurers and most employers from discriminating against individuals based on genetic information (including the results of genetic tests and family history information). According to GINA, health insurance  companies cannot consider genetic information to be a preexisting condition, nor can they use it to make decisions regarding coverage or rates. GINA also makes it illegal for most employers to use genetic information in making decisions about hiring, firing, promotion, or terms of employment. It is important to note that GINA does not offer protections for life insurance, disability insurance, or long-term care insurance. GINA does not apply to those in the [employer], those who work for companies with less than 15 employees, and new life insurance or long-term disability insurance policies.  Health status due to a cancer diagnosis is not protected under GINA. More information about GINA can be found by visiting [email or web address].   The patient has CLL, which precludes genetic testing through blood.  We discussed that genetic testing will need to be performed through a skin biopsy.  She will have a bone biopsy on Thursday,  Feb 14, 2023 and we will try to coordinate a skin biopsy for the same date.  PLAN: After considering the risks, benefits, and limitations, Ms. Underberg provided informed consent to pursue genetic testing.  She will have a bone biopsy on 02/14/2023.  We have coordinated her skin biopsy for the same time and will be performed by Dr. Sophronia Simas.  The skin biopsy will be sent to [facility] laboratories for culturing and the fibroblasts will then be sent to [facility] for analysis of the CancerNext-Expanded. Results should be available within approximately 6-8 weeks' time, at which point they will be disclosed by telephone to Ms. Selvaggio, as will any additional recommendations warranted by these results. Ms. Milone will receive a summary of her genetic counseling visit and a copy of her results once available. This information will also be available in Epic.   Lastly, we encouraged Ms. Clites to remain in contact with cancer genetics annually so that we can continuously update the family  history and inform her of any changes in cancer genetics and testing that may be of benefit for this family.   Ms. Loza questions were answered to her satisfaction today. Our contact information was provided should additional questions or concerns arise. Thank you for the referral and allowing [place] to share in the care of your patient.   Shaydon Lease P. Lowell Guitar, MS, [facility] Licensed, Patent attorney [email or web address] phone: [phone]  The patient was seen for a total of 45 minutes in face-to-face genetic counseling.  The patient was seen alone.  Drs. Meliton Rattan, and/or [place] were available for questions, if needed..    _______________________________________________________________________ For Office Staff:  Number of people involved in session: 1 Was an Intern/ student involved with case: no

## 2023-02-13 NOTE — Consult Note (Signed)
Chief Complaint: Patient was seen in consultation today for image guided L3 lesion biopsy  Referring Physician(s): Feng,Yan  Supervising Physician: Marliss Coots  Patient Status: [facility] - Out-pt  History of Present Illness: Kaitlin Barnes is a 72 y.o. female with PMH sig for CLL 2013, hypothyroidism, nephrolithiasis who presents now with newly diagnosed left breast cancer with likely bone mets. PET scan on 02/07/23 revealed:  1. Hypermetabolic left breast mass consistent with known primary breast malignancy. 2. Hypermetabolic nodal metastases in the left axilla and subpectoral regions. 3. No evidence of metastatic disease within the abdomen or pelvis. 4. Several hypermetabolic osseous lesions in the thoracolumbar spine and right ribs, consistent with osseous metastatic disease. 5. Nonobstructing bilateral renal calcul   She is scheduled today for image guided L3 lesion biopsy.    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  Medications: Prior to Admission medications   Medication Sig Start Date End Date Taking? Authorizing Provider  aspirin EC 81 MG tablet Take 81 mg by mouth daily. Swallow whole.    [provider]  cholecalciferol (VITAMIN D) 1000 UNITS tablet Take 2,000 Units by mouth daily.    [provider]  levothyroxine (SYNTHROID) 75 MCG tablet Take 75 mcg by mouth daily. 06/01/20   [provider]  Multiple Vitamin (MULTIVITAMIN) tablet Take 1 tablet by mouth daily.    [provider]  omeprazole (PRILOSEC) 20 MG capsule Take 20 mg by mouth daily.     [provider]     Family History  Problem Relation Age of Onset   Breast cancer Mother        dx. 60s   Prostate cancer Father    Breast cancer Sister 98   Prostate cancer Brother 62   Multiple myeloma Brother    Ovarian cancer Paternal Aunt 31   Breast cancer Paternal Aunt    Ovarian cancer Paternal Aunt    Cancer Paternal Uncle        unknown   Breast cancer Maternal Grandmother        dx. 60s-70s   Breast cancer Paternal Grandmother    Ovarian cancer Paternal Grandmother     Social History   Socioeconomic History   Marital status: Married    Spouse name: Not on file   Number of children: Not on file   Years of education: Not on file   Highest education level: Not on file  Occupational History   Not on file  Tobacco Use   Smoking status: Never   Smokeless tobacco: Never  Substance and Sexual Activity   Alcohol use: No   Drug use: No   Sexual activity: Not on file  Other Topics Concern  Not on file  Social History Narrative   Not on file   Social Determinants of Health   Financial Resource Strain: Not on file  Food Insecurity: Not on file  Transportation Needs: Not on file  Physical Activity: Not on file  Stress: Not on file  Social Connections: Not on file      Review of Systems denies fever,HA,CP,dyspnea, cough, abd pain, back pain,N/V or bleeding; does have some RLE discomfort, neuropathy  Vital Signs: Vitals:   02/14/23 0746  BP: (!) 162/92  Pulse: 71  Resp: 18  Temp: 97.9 F (36.6 C)  SpO2: 95%      Code Status: FULL CODE  Advance Care Plan: no advanced care documents on file    Physical Exam: awake/alert; chest- few fine rt basilar crackles,  left clear; heart- RRR; abd- soft,+BS,NT; no sig LE edema  Imaging: NM PET Image Initial (PI) Skull Base To Thigh (F-18 FDG)  Result Date: 02/07/2023 CLINICAL DATA:  Initial treatment strategy for invasive left breast cancer. EXAM: NUCLEAR MEDICINE PET SKULL BASE TO THIGH TECHNIQUE: 9.14 mCi F-18 FDG was injected intravenously. Full-ring PET imaging was performed from the skull base to thigh after the radiotracer. CT data was obtained and used for attenuation correction and anatomic localization. Fasting blood glucose: 91 mg/dl COMPARISON:  Recent breast imaging. CT of the chest, abdomen and pelvis 08/30/2022. FINDINGS: Mediastinal blood pool activity: SUV max 1.8 NECK: No hypermetabolic cervical lymph nodes are identified. No suspicious activity identified within the pharyngeal mucosal space. Incidental CT findings: Patient appears to be status post left thyroidectomy. CHEST: Left lateral breast mass is hypermetabolic with an SUV max of 11.0. This mass measures 2.4 cm and contains a biopsy clip. Dominant left axillary lymph node has mildly enlarged, measuring 3.0 x 2.1 cm on image 56/4 and is hypermetabolic with an SUV max of 13.7. There are several small hypermetabolic subpectoral lymph nodes on the left. No hypermetabolic mediastinal, hilar or internal mammary lymph nodes. No hypermetabolic pulmonary activity or suspicious nodularity. Incidental CT findings: Stable tiny right middle lobe subpleural nodule on image 68/4 consistent with a benign finding. ABDOMEN/PELVIS: There is no hypermetabolic activity within the liver, adrenal glands, spleen or pancreas. There is no hypermetabolic nodal activity in the abdomen or pelvis. Incidental CT findings: Small nonobstructing bilateral renal calculi. No evidence of ureteral calculus or hydronephrosis. SKELETON: There are several hypermetabolic osseous lesions concerning for metastatic disease. The most worrisome lesions are in the L3 vertebral body (SUV max 10.6) and  the T11 vertebral body (SUV max 6.8). There are other smaller lesions in the L2 vertebral body and within the right 1st and 4th ribs. No pathologic fractures identified. Incidental CT findings: Multilevel spondylosis with a degenerative anterolisthesis at L4-5. Asymmetric left common hamstring tendinosis noted. IMPRESSION: 1. Hypermetabolic left breast mass consistent with known primary breast malignancy. 2. Hypermetabolic nodal metastases in the left axilla and subpectoral regions. 3. No evidence of metastatic disease within the abdomen or pelvis. 4. Several hypermetabolic osseous lesions in the thoracolumbar spine and right ribs, consistent with osseous metastatic disease. 5. Nonobstructing bilateral renal calculi. Electronically Signed   By: Carey Bullocks M.D.   On: 02/07/2023 12:27   [place] LT BREAST BX W LOC DEV 1ST LESION IMG BX SPEC US GUIDE  Addendum Date: 01/30/2023   ADDENDUM REPORT: 01/30/2023 13:40 ADDENDUM: PATHOLOGY revealed: Site 1. Breast, LEFT, needle core biopsy, 3 o'clock, mass, coil clip - INVASIVE MAMMARY CARCINOMA, - MAMMARY CARCINOMA IN SITU, INTERMEDIATE GRADE - OVERALL  GRADE: 2 -LYMPHOVASCULAR INVASION: NOT IDENTIFIED - CALCIFICATIONS: PRESENT, RARE - OTHER FINDINGS: INTRADUCTAL PAPILLOMA WITH ASSOCIATED MICROCALCIFICATION. Pathology results are CONCORDANT with imaging findings, per Dr. Amie Portland. PATHOLOGY revealed: Site 2. Lymph node, needle/core biopsy, axilla, lymph node, hydromark clip - METASTATIC CARCINOMA - NO DEFINITIVE LYMPHOID TISSUE IDENTIFIED. Pathology results are CONCORDANT with imaging findings, per Dr. Amie Portland. Pathology results and recommendations below were discussed with patient by telephone on 01/30/2023. Patient reported biopsy site within normal limits with slight tenderness at the site. Post biopsy care instructions were reviewed, questions were answered and my direct phone number was provided to patient. Patient was instructed to call [facility] if any concerns or questions arise related to the biopsy. RECOMMENDATION: Patient sees Dr. Artis Delay at one [facility] at [facility] and has an appointment with her this Friday, Feb 01, 2023 at 12:00. She wishes to discuss surgical consultation with Dr. Bertis Ruddy during this visit. Dr.Gorsuch's office notified. Pathology results reported by Donell Sievert, RN on 01/30/2023. Electronically Signed   By: Amie Portland M.D.   On: 01/30/2023 13:40   Result Date: 01/30/2023 CLINICAL DATA:  Patient presents for ultrasound-guided core needle biopsy of a left breast mass and an enlarged left axillary lymph node. EXAM: ULTRASOUND GUIDED LEFT BREAST CORE NEEDLE BIOPSY ULTRASOUND-GUIDED LEFT AXILLARY LYMPH NODE CORE NEEDLE BIOPSY COMPARISON:  Previous exam(s). PROCEDURE: I met with the patient and we discussed the procedure of ultrasound-guided biopsy, including benefits and alternatives. We discussed the high likelihood of a successful procedure. We discussed the risks of the procedure, including infection, bleeding, tissue injury, clip migration, and inadequate sampling. Informed written consent was given. The usual time-out protocol was performed immediately prior to the procedure. Biopsy #1: Left breast mass. Lesion quadrant: Upper outer quadrant: Near 3 o'clock, 5 cm the nipple. Using sterile technique and 1% Lidocaine as local anesthetic, under direct ultrasound visualization, a 12 gauge spring-loaded device was used to perform biopsy of the 2.3 cm mass at 3 o'clock using an inferior approach. At the conclusion of the procedure a coil shaped tissue marker clip was deployed into the biopsy cavity. Biopsy #1: Left axillary lymph node. Lesion location: Left axilla. Using sterile technique and 1% Lidocaine as local anesthetic, under direct ultrasound visualization, a 14 gauge spring-loaded device was used to perform biopsy of the enlarged left axillary lymph node using an inferolateral approach. At  the conclusion of the procedure a HydroMARK tissue marker clip was deployed into the biopsy cavity. Follow up 2 view mammogram was performed and dictated separately. IMPRESSION: Ultrasound guided biopsy of left breast mass and enlarged left axillary lymph node. No apparent complications. Electronically Signed: By: Amie Portland M.D. On: 01/29/2023 13:35  [place] AXILLARY NODE CORE BIOPSY LEFT  Addendum Date: 01/30/2023   ADDENDUM REPORT: 01/30/2023 13:40 ADDENDUM: PATHOLOGY revealed: Site 1. Breast, LEFT, needle core biopsy, 3 o'clock, mass, coil clip - INVASIVE MAMMARY CARCINOMA, - MAMMARY CARCINOMA IN SITU, INTERMEDIATE GRADE - OVERALL GRADE: 2 -LYMPHOVASCULAR INVASION: NOT IDENTIFIED - CALCIFICATIONS: PRESENT, RARE - OTHER FINDINGS: INTRADUCTAL PAPILLOMA WITH ASSOCIATED MICROCALCIFICATION. Pathology results are CONCORDANT with imaging findings, per Dr. Amie Portland. PATHOLOGY revealed: Site 2. Lymph node, needle/core biopsy, axilla, lymph node, hydromark clip - METASTATIC CARCINOMA - NO DEFINITIVE LYMPHOID TISSUE IDENTIFIED. Pathology results are CONCORDANT with imaging findings, per Dr. Amie Portland. Pathology results and recommendations below were discussed with patient by telephone on 01/30/2023. Patient reported biopsy site within normal limits with slight tenderness at the  site. Post biopsy care instructions were reviewed, questions were answered and my direct phone number was provided to patient. Patient was instructed to call [facility] if any concerns or questions arise related to the biopsy. RECOMMENDATION: Patient sees Dr. Artis Delay at one [facility] at [facility] and has an appointment with her this Friday, Feb 01, 2023 at 12:00. She wishes to discuss surgical consultation with Dr. Bertis Ruddy during this visit. Dr.Gorsuch's office notified. Pathology results reported by Donell Sievert, RN on 01/30/2023. Electronically Signed   By: Amie Portland M.D.   On: 01/30/2023 13:40    Result Date: 01/30/2023 CLINICAL DATA:  Patient presents for ultrasound-guided core needle biopsy of a left breast mass and an enlarged left axillary lymph node. EXAM: ULTRASOUND GUIDED LEFT BREAST CORE NEEDLE BIOPSY ULTRASOUND-GUIDED LEFT AXILLARY LYMPH NODE CORE NEEDLE BIOPSY COMPARISON:  Previous exam(s). PROCEDURE: I met with the patient and we discussed the procedure of ultrasound-guided biopsy, including benefits and alternatives. We discussed the high likelihood of a successful procedure. We discussed the risks of the procedure, including infection, bleeding, tissue injury, clip migration, and inadequate sampling. Informed written consent was given. The usual time-out protocol was performed immediately prior to the procedure. Biopsy #1: Left breast mass. Lesion quadrant: Upper outer quadrant: Near 3 o'clock, 5 cm the nipple. Using sterile technique and 1% Lidocaine as local anesthetic, under direct ultrasound visualization, a 12 gauge spring-loaded device was used to perform biopsy of the 2.3 cm mass at 3 o'clock using an inferior approach. At the conclusion of the procedure a coil shaped tissue marker clip was deployed into the biopsy cavity. Biopsy #1: Left axillary lymph node. Lesion location: Left axilla. Using sterile technique and 1% Lidocaine as local anesthetic, under direct ultrasound visualization, a 14 gauge spring-loaded device was used to perform biopsy of the enlarged left axillary lymph node using an inferolateral approach. At the conclusion of the procedure a HydroMARK tissue marker clip was deployed into the biopsy cavity. Follow up 2 view mammogram was performed and dictated separately. IMPRESSION: Ultrasound guided biopsy of left breast mass and enlarged left axillary lymph node. No apparent complications. Electronically Signed: By: Amie Portland M.D. On: 01/29/2023 13:35  MM CLIP PLACEMENT LEFT  Result Date: 01/29/2023 CLINICAL DATA:  Assessed post biopsy marker clip placements  following ultrasound-guided core needle biopsy of a left breast mass and a left axilla lymph node. EXAM: 3D DIAGNOSTIC LEFT MAMMOGRAM POST ULTRASOUND BIOPSY COMPARISON:  Previous exam(s). FINDINGS: 3D Mammographic images were obtained following ultrasound guided biopsy of left mass and a left axilla lymph. The coil shaped biopsy clip lies within the mass the upper outer left breast. The spiral HydroMARK clip lies within the enlarged left axillary lymph nodes. IMPRESSION: Appropriate positioning of the coil shaped and HydroMARK biopsy marking clips at the site of biopsy in the left breast and left axilla as detailed above. Final Assessment: Post Procedure Mammograms for Marker Placement Electronically Signed   By: Amie Portland M.D.   On: 01/29/2023 14:00  MM 3D DIAGNOSTIC MAMMOGRAM BILATERAL BREAST  Result Date: 01/23/2023 CLINICAL DATA:  New mass felt by the patient in the upper outer left breast for the past 2-3 months. She has chronic lymphocytic leukemia. Family history of breast cancer in her sister. EXAM: DIGITAL DIAGNOSTIC BILATERAL MAMMOGRAM WITH TOMOSYNTHESIS; ULTRASOUND LEFT BREAST LIMITED TECHNIQUE: Bilateral digital diagnostic mammography and breast tomosynthesis was performed.; Targeted ultrasound examination of the left breast was performed. COMPARISON:  Previous exam(s). ACR Breast  Density Category c: The breasts are heterogeneously dense, which may obscure small masses. FINDINGS: There is a mildly irregular mass in the upper-outer left breast posteriorly in the region of the mass felt by the patient, marked with a metallic marker. This has indistinct margins and contains microcalcifications. There is an oval, circumscribed mass in the medial retroareolar/periareolar left breast. No interval findings suspicious for malignancy in the right breast. On physical exam, there is a faintly palpable mass measuring approximately 2.5 cm in the 3 o'clock position of the left breast, 5 cm from the nipple.  There are no palpable left axillary lymph nodes with the patient upright. Targeted ultrasound is performed, showing 2.3 x 2.2 x 1.2 cm irregular, hypoechoic mass containing calcifications and some medium echotexture portions. This has indistinct margins with ill-defined surrounding increased echogenicity and is located in the 3 o'clock position, 5 cm from the nipple. This corresponds to the palpable mass in the mass seen mammographically in this area. A 2.5 cm simple cyst is demonstrated in the 10 o'clock position of the left breast, 2 cm from the nipple, corresponding to the 2nd mammographic mass. A single markedly enlarged left axillary lymph node is demonstrated measuring 3.1 x 3.0 x 1.5 cm with obliteration of the normal fatty hilum. IMPRESSION: 1. 2.3 cm mass in the 3 o'clock position of the left breast with imaging features highly suspicious for primary breast cancer. 2. 3.1 cm markedly enlarged left axillary lymph node most likely representing a metastatic node associated with the patient's probable primary breast cancer. A single enlarged lymph node associated with the patient's chronic lymphocytic leukemia is less likely. RECOMMENDATION: 1. Ultrasound-guided core needle biopsy of the 2.3 cm mass in the 3 o'clock position of the left breast. 2. Ultrasound-guided core needle biopsy of the 3.1 cm enlarged left axillary lymph node. This has been discussed with the patient and her husband and the biopsies have been scheduled at 12:45 p.m. on 01/29/2023. I have discussed the findings and recommendations with the patient. If applicable, a reminder letter will be sent to the patient regarding the next appointment. BI-RADS CATEGORY  5: Highly suggestive of malignancy. Electronically Signed   By: Beckie Salts M.D.   On: 01/23/2023 15:25  [place] LIMITED ULTRASOUND INCLUDING AXILLA LEFT BREAST   Result Date: 01/23/2023 CLINICAL DATA:  New mass felt by the patient in the upper outer left breast for the past 2-3 months.  She has chronic lymphocytic leukemia. Family history of breast cancer in her sister. EXAM: DIGITAL DIAGNOSTIC BILATERAL MAMMOGRAM WITH TOMOSYNTHESIS; ULTRASOUND LEFT BREAST LIMITED TECHNIQUE: Bilateral digital diagnostic mammography and breast tomosynthesis was performed.; Targeted ultrasound examination of the left breast was performed. COMPARISON:  Previous exam(s). ACR Breast Density Category c: The breasts are heterogeneously dense, which may obscure small masses. FINDINGS: There is a mildly irregular mass in the upper-outer left breast posteriorly in the region of the mass felt by the patient, marked with a metallic marker. This has indistinct margins and contains microcalcifications. There is an oval, circumscribed mass in the medial retroareolar/periareolar left breast. No interval findings suspicious for malignancy in the right breast. On physical exam, there is a faintly palpable mass measuring approximately 2.5 cm in the 3 o'clock position of the left breast, 5 cm from the nipple. There are no palpable left axillary lymph nodes with the patient upright. Targeted ultrasound is performed, showing 2.3 x 2.2 x 1.2 cm irregular, hypoechoic mass containing calcifications and some medium echotexture portions. This has indistinct margins with ill-defined  surrounding increased echogenicity and is located in the 3 o'clock position, 5 cm from the nipple. This corresponds to the palpable mass in the mass seen mammographically in this area. A 2.5 cm simple cyst is demonstrated in the 10 o'clock position of the left breast, 2 cm from the nipple, corresponding to the 2nd mammographic mass. A single markedly enlarged left axillary lymph node is demonstrated measuring 3.1 x 3.0 x 1.5 cm with obliteration of the normal fatty hilum. IMPRESSION: 1. 2.3 cm mass in the 3 o'clock position of the left breast with imaging features highly suspicious for primary breast cancer. 2. 3.1 cm markedly enlarged left axillary lymph node  most likely representing a metastatic node associated with the patient's probable primary breast cancer. A single enlarged lymph node associated with the patient's chronic lymphocytic leukemia is less likely. RECOMMENDATION: 1. Ultrasound-guided core needle biopsy of the 2.3 cm mass in the 3 o'clock position of the left breast. 2. Ultrasound-guided core needle biopsy of the 3.1 cm enlarged left axillary lymph node. This has been discussed with the patient and her husband and the biopsies have been scheduled at 12:45 p.m. on 01/29/2023. I have discussed the findings and recommendations with the patient. If applicable, a reminder letter will be sent to the patient regarding the next appointment. BI-RADS CATEGORY  5: Highly suggestive of malignancy. Electronically Signed   By: Beckie Salts M.D.   On: 01/23/2023 15:25   Labs:  CBC: Recent Labs    08/24/22 0921 02/01/23 1254  WBC 47.2* 66.4*  HGB 13.2 14.7  HCT 40.4 46.2*  PLT 208 250    COAGS: No results for input(s): "INR", "APTT" in the last 8760 hours.  BMP: Recent Labs    08/30/22 1901 02/01/23 1254  NA  --  141  K  --  4.3  CL  --  107  CO2  --  30  GLUCOSE  --  88  BUN  --  15  CALCIUM  --  10.6*  CREATININE 0.80 0.71  GFRNONAA  --  >60    LIVER FUNCTION TESTS: Recent Labs    02/01/23 1254  BILITOT 0.4  AST 19  ALT 21  ALKPHOS 99  PROT 7.1  ALBUMIN 4.5    TUMOR MARKERS: No results for input(s): "AFPTM", "CEA", "CA199", "CHROMGRNA" in the last 8760 hours.  Assessment and Plan: 72 y.o. female with PMH sig for CLL 2013, hypothyroidism, nephrolithiasis who presents now with newly diagnosed left breast cancer with likely bone mets. PET scan on 02/07/23 revealed:  1. Hypermetabolic left breast mass consistent with known primary breast malignancy. 2. Hypermetabolic nodal metastases in the left axilla and subpectoral regions. 3. No evidence of metastatic disease within the abdomen or pelvis. 4. Several hypermetabolic  osseous lesions in the thoracolumbar spine and right ribs, consistent with osseous metastatic disease. 5. Nonobstructing bilateral renal calcul   She is scheduled today for image guided L3 lesion biopsy.Risks and benefits of procedure was discussed with the patient  including, but not limited to bleeding, infection, damage to adjacent structures or low yield requiring additional tests.  All of the questions were answered and there is agreement to proceed.  Consent signed and in chart.  Pt also scheduled for skin bx by Dr. Freida Busman today  Thank you for this interesting consult.  I greatly enjoyed meeting NAIRA TEJADA and look forward to participating in their care.  A copy of this report was sent to the requesting provider on this date.  Electronically Signed: D. Jeananne Rama, PA-C 02/13/2023, 3:25 PM   I spent a total of  25 minutes   in face to face in clinical consultation, greater than 50% of which was counseling/coordinating care for image guided L3(lumbar) lesion biopsy

## 2023-02-14 ENCOUNTER — Other Ambulatory Visit: Payer: Self-pay | Admitting: Hematology

## 2023-02-14 ENCOUNTER — Ambulatory Visit (HOSPITAL_COMMUNITY)
Admission: RE | Admit: 2023-02-14 | Discharge: 2023-02-14 | Disposition: A | Discharge status: Home / Self Care | Payer: PPO | Source: Ambulatory Visit | Attending: Hematology | Admitting: Hematology

## 2023-02-14 ENCOUNTER — Encounter (HOSPITAL_COMMUNITY): Payer: Self-pay

## 2023-02-14 ENCOUNTER — Other Ambulatory Visit: Payer: Self-pay

## 2023-02-14 DIAGNOSIS — C50912 Malignant neoplasm of unspecified site of left female breast: Secondary | ICD-10-CM

## 2023-02-14 DIAGNOSIS — C9110 Chronic lymphocytic leukemia of B-cell type not having achieved remission: Secondary | ICD-10-CM

## 2023-02-14 DIAGNOSIS — C7951 Secondary malignant neoplasm of bone: Secondary | ICD-10-CM | POA: Insufficient documentation

## 2023-02-14 DIAGNOSIS — Z803 Family history of malignant neoplasm of breast: Secondary | ICD-10-CM | POA: Insufficient documentation

## 2023-02-14 DIAGNOSIS — Z807 Family history of other malignant neoplasms of lymphoid, hematopoietic and related tissues: Secondary | ICD-10-CM | POA: Insufficient documentation

## 2023-02-14 DIAGNOSIS — Z8041 Family history of malignant neoplasm of ovary: Secondary | ICD-10-CM | POA: Diagnosis not present

## 2023-02-14 DIAGNOSIS — Z8042 Family history of malignant neoplasm of prostate: Secondary | ICD-10-CM | POA: Insufficient documentation

## 2023-02-14 HISTORY — PX: IR FLUORO GUIDED NEEDLE PLC ASPIRATION/INJECTION LOC: IMG2395

## 2023-02-14 MED ORDER — FENTANYL CITRATE (PF) 100 MCG/2ML IJ SOLN
INTRAMUSCULAR | Status: AC | PRN
  Administered 2023-02-14 (×2): 50 ug via INTRAVENOUS

## 2023-02-14 MED ORDER — MIDAZOLAM HCL 2 MG/2ML IJ SOLN
INTRAMUSCULAR | Status: AC
  Filled 2023-02-14: qty 4

## 2023-02-14 MED ORDER — MIDAZOLAM HCL 2 MG/2ML IJ SOLN
INTRAMUSCULAR | Status: AC | PRN
  Administered 2023-02-14 (×3): 1 mg via INTRAVENOUS

## 2023-02-14 MED ORDER — LIDOCAINE HCL (PF) 1 % IJ SOLN
30.0000 mL | Freq: Once | INTRAMUSCULAR | Status: AC
  Administered 2023-02-14: 10 mL via INTRADERMAL

## 2023-02-14 MED ORDER — FENTANYL CITRATE (PF) 100 MCG/2ML IJ SOLN
INTRAMUSCULAR | Status: AC
  Filled 2023-02-14: qty 2

## 2023-02-14 MED ORDER — LIDOCAINE HCL (PF) 1 % IJ SOLN
INTRAMUSCULAR | Status: AC
  Filled 2023-02-14: qty 30

## 2023-02-14 MED ORDER — SODIUM CHLORIDE 0.9 % IV SOLN: INTRAVENOUS | Status: DC

## 2023-02-14 NOTE — Procedures (Signed)
Date: 02/14/23  Patient: Kaitlin Barnes MRN: [id number]  Procedure: Punch biopsy of left forearm (for genetic testing)  Procedure details: Informed consent was obtained in the preoperative area prior to the procedure. The skin on the left ventral forearm was infiltrated with 1mL 1% lidocaine. A 4mm punch was used to excise three adjacent segments of skin. The result wound was closed with a 3-0 monocryl horizontal mattress suture. A sterile gauze dressing was applied.  The patient tolerated the procedure well with no apparent complications.  Sophronia Simas, MD 02/14/23 10:23 AM

## 2023-02-14 NOTE — Procedures (Signed)
Interventional Radiology Procedure Note  Procedure:  Fluoroscopic guide L3 bone biopsy.  Findings: Please refer to procedural dictation for full description. 11 ga core bx x3 from right L3 vertebral body.  Complications: None immediate  Estimated Blood Loss: < 5 ml  Recommendations: 1 hour bedrest. Follow up Pathology results.   Marliss Coots, MD

## 2023-02-14 NOTE — H&P (Signed)
   Kaitlin Barnes Apr 28, 1951  [id number].    Requesting MD: Dr. Mosetta Putt Chief Complaint/Reason for Consult: skin biopsy  HPI:  Kaitlin Barnes is a 72 yo female with stage IV breast cancer and CLL. She is to undergo genetic testing, and a skin biopsy was requested for genetic testing. She will also be undergoing a bone biopsy in IR today.  ROS: ROS  Family History  Problem Relation Age of Onset   Breast cancer Mother        dx. 60s   Prostate cancer Father    Breast cancer Sister 72   Prostate cancer Brother 44   Multiple myeloma Brother    Ovarian cancer Paternal Aunt 48   Breast cancer Paternal Aunt    Ovarian cancer Paternal Aunt    Cancer Paternal Uncle        unknown   Breast cancer Maternal Grandmother        dx. 60s-70s   Breast cancer Paternal Grandmother    Ovarian cancer Paternal Grandmother     Past Medical History:  Diagnosis Date   CLL (chronic lymphoblastic leukemia)    CLL (chronic lymphocytic leukemia) (HCC) 06/12/2012   Family history of breast cancer    Family history of ovarian cancer    Family history of prostate cancer    Fever 03/28/2016   Hypercalcemia    Hyperparathyroidism, unspecified (HCC)    Hypothyroidism    Lung nodule 07/16/2013    Past Surgical History:  Procedure Laterality Date   BREAST BIOPSY Left    benign   BREAST BIOPSY Left 01/29/2023   times 2   BREAST BIOPSY Left 01/29/2023   [place] LT BREAST BX W LOC DEV 1ST LESION IMG BX SPEC US GUIDE 01/29/2023 [facility]   KNEE ARTHROSCOPY Left    THYROIDECTOMY      Social History:  reports that she has never smoked. She has never used smokeless tobacco. She reports that she does not drink alcohol and does not use drugs.  Allergies: No Known Allergies  (Not in a hospital admission)    Physical Exam: Blood pressure (!) 162/92, pulse 71, temperature 97.9 F (36.6 C), temperature source Oral, resp. rate 18, height 5\' 2"  (1.575 m), weight 77.1 kg, SpO2 95 %. General: resting  comfortably, NAD Neuro: alert and oriented, no focal deficits Resp: normal work of breathing on room air CV: RRR Extremities: warm and well-perfused, no rashes or lesions   No results found for this or any previous visit (from the past 48 hour(s)). No results found.    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 Skin biopsy requested for genetic testing. I reviewed the details of a punch biopsy of the forearm with the patient and informed consent was obtained. She has no preference for left or right arm. I reviewed post-procedure care instructions for the biopsy site. This will be done in IR while she is sedated for her bone biopsy. All questions were answered.   Sophronia Simas, MD [facility] Surgery General, Hepatobiliary and Pancreatic Surgery 02/14/23 9:03 AM

## 2023-02-14 NOTE — Discharge Instructions (Addendum)
Please call Interventional Radiology clinic [phone] with any questions or concerns.   You may remove your dressing and shower tomorrow.    Needle Biopsy, Care After These instructions tell you how to care for yourself after your procedure. Your doctor may also give you more specific instructions. Call your doctor if Kaitlin Barnes any problems or questions. What can I expect after the procedure? After the procedure, it is common to have: Soreness. Bruising. Mild pain. Follow these instructions at home:  Return to your normal activities as told by your doctor. Ask your doctor what activities are safe for you. Take over-the-counter and prescription medicines only as told by your doctor. Wash your hands with soap and water before you change your bandage (dressing). If you cannot use soap and water, use hand sanitizer. Follow instructions from your doctor about: How to take care of your puncture site. When and how to change your bandage. When to remove your bandage. Check your puncture site every day for signs of infection. Watch for: Redness, swelling, or pain. Fluid or blood. Pus or a bad smell. Warmth. Do not take baths, swim, or use a hot tub until your doctor approves. Ask your doctor if you may take showers. You may only be allowed to take sponge baths. Keep all follow-up visits as told by your doctor. This is important. Contact a doctor if you have: A fever. Redness, swelling, or pain at the puncture site, and it lasts longer than a few days. Fluid, blood, or pus coming from the puncture site. Warmth coming from the puncture site. Get help right away if: You have a lot of bleeding from the puncture site. Summary After the procedure, it is common to have soreness, bruising, or mild pain at the puncture site. Check your puncture site every day for signs of infection, such as redness, swelling, or pain. Get help right away if you have severe bleeding from your puncture  site. This information is not intended to replace advice given to you by your health care provider. Make sure you discuss any questions you have with your healthcare provider. Document Revised: 03/10/2020 Document Reviewed: 03/10/2020 Elsevier Patient Education  2022 [employer]   Moderate Conscious Sedation, Adult, Care After This sheet gives you information about how to care for yourself after your procedure. Your health care provider may also give you more specific instructions. If you have problems or questions, contact your health care provider. What can I expect after the procedure? After the procedure, it is common to have: Sleepiness for several hours. Impaired judgment for several hours. Difficulty with balance. Vomiting if you eat too soon. Follow these instructions at home: For the time period you were told by your health care provider: Rest. Do not participate in activities where you could fall or become injured. Do not drive or use machinery. Do not drink alcohol. Do not take sleeping pills or medicines that cause drowsiness. Do not make important decisions or sign legal documents. Do not take care of children on your own.      Eating and drinking Follow the diet recommended by your health care provider. Drink enough fluid to keep your urine pale yellow. If you vomit: Drink water, juice, or soup when you can drink without vomiting. Make sure you have little or no nausea before eating solid foods.   General instructions Take over-the-counter and prescription medicines only as told by your health care provider. Have a responsible adult stay with you for the time you are told.  It is important to have someone help care for you until you are awake and alert. Do not smoke. Keep all follow-up visits as told by your health care provider. This is important. Contact a health care provider if: You are still sleepy or having trouble with balance after 24 hours. You feel  light-headed. You keep feeling nauseous or you keep vomiting. You develop a rash. You have a fever. You have redness or swelling around the IV site. Get help right away if: You have trouble breathing. You have new-onset confusion at home. Summary After the procedure, it is common to feel sleepy, have impaired judgment, or feel nauseous if you eat too soon. Rest after you get home. Know the things you should not do after the procedure. Follow the diet recommended by your health care provider and drink enough fluid to keep your urine pale yellow. Get help right away if you have trouble breathing or new-onset confusion at home. This information is not intended to replace advice given to you by your health care provider. Make sure you discuss any questions you have with your health care provider. Document Revised: 01/08/2020 Document Reviewed: 08/06/2019 Elsevier Patient Education  2021 [employer]      Skin Biopsy Site: You may remove your dressing in 24 hours and shower. There is a stitch in your skin, which will fall out in a few weeks. You may keep a bandaid over this site if you have any drainage. If you notice redness, excessive drainage, or increasing pain, please call the [facility] Surgery office at [phone].

## 2023-02-15 LAB — SURGICAL PATHOLOGY

## 2023-02-18 DIAGNOSIS — C7951 Secondary malignant neoplasm of bone: Secondary | ICD-10-CM | POA: Insufficient documentation

## 2023-02-18 NOTE — Assessment & Plan Note (Signed)
-  She has metastatic breast cancer to bones, confirmed by biopsy. -We discussed cancer related bone pain and risk of fracture, she is asymptomatic now. -I recommend Zometa infusion every 3 months, to reduce her risk of fracture.  Potential benefit and side effect, especially risk of jaw necrosis, and dental care, were discussed with her in detail.  She is interested -Plan to start in a few weeks

## 2023-02-18 NOTE — Assessment & Plan Note (Signed)
Stage IV, ([id number]), ER+/PR+/HER2 low expression, Grade 2, with bone metastasis  -Patient presented with a palpable left breast mass and enlarged left axillary lymph nodes in April 2024.  Previous mammogram 1 year ago was negative. -I reviewed her breast and left axillary lymph node biopsy, both showed grade 2 invasive ductal carcinoma, ER 100% and PR 20% strongly positive, HER2 IHC 2+, FISH negative. -I personally reviewed her PET scan images with patient and her husband, it showed hypermetabolic left breast, left axilla and subpectoral nodes, and multiple spinal bone mets.  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  Prognostic panel results still pending. -Due to her metastatic disease, upfront breast surgery is not recommended.  Patient was seen by breast surgeon Dr. Donell Beers today.   -I recommend first-line systemic therapy with aromatase inhibitor and CDK 4/6 inhibitor, anastrozole and Verzenio - The potential benefit and side effects, which includes but not limited to, hot flash, skin and vaginal dryness, metabolic changes ( increased blood glucose, cholesterol, weight, etc.), slightly in increased risk of cardiovascular disease, cataracts, muscular and joint discomfort, osteopenia and osteoporosis, cytopenias, especially risk of infection from neutropenia, diarrhea, etc, were discussed with her in great details. She is interested, and she was started this week -The goal of therapy is disease control, disease unfortunately not curable.

## 2023-02-19 ENCOUNTER — Other Ambulatory Visit (HOSPITAL_COMMUNITY): Payer: Self-pay

## 2023-02-19 ENCOUNTER — Ambulatory Visit (HOSPITAL_COMMUNITY)
Admission: RE | Admit: 2023-02-19 | Discharge: 2023-02-19 | Disposition: A | Discharge status: Home / Self Care | Payer: PPO | Source: Ambulatory Visit | Attending: Hematology | Admitting: Hematology

## 2023-02-19 ENCOUNTER — Inpatient Hospital Stay (HOSPITAL_BASED_OUTPATIENT_CLINIC_OR_DEPARTMENT_OTHER): Payer: PPO | Admitting: Hematology

## 2023-02-19 ENCOUNTER — Telehealth: Payer: Self-pay | Admitting: Pharmacy Technician

## 2023-02-19 ENCOUNTER — Telehealth: Payer: Self-pay | Admitting: Pharmacist

## 2023-02-19 ENCOUNTER — Encounter: Payer: Self-pay | Admitting: Hematology

## 2023-02-19 ENCOUNTER — Other Ambulatory Visit: Payer: Self-pay

## 2023-02-19 VITALS — BP 160/75 | HR 78 | Temp 98.3°F | Resp 16 | Wt 171.1 lb

## 2023-02-19 DIAGNOSIS — C50912 Malignant neoplasm of unspecified site of left female breast: Secondary | ICD-10-CM | POA: Diagnosis not present

## 2023-02-19 DIAGNOSIS — C773 Secondary and unspecified malignant neoplasm of axilla and upper limb lymph nodes: Secondary | ICD-10-CM | POA: Diagnosis not present

## 2023-02-19 DIAGNOSIS — M79605 Pain in left leg: Secondary | ICD-10-CM | POA: Insufficient documentation

## 2023-02-19 DIAGNOSIS — M79604 Pain in right leg: Secondary | ICD-10-CM | POA: Insufficient documentation

## 2023-02-19 DIAGNOSIS — C7951 Secondary malignant neoplasm of bone: Secondary | ICD-10-CM | POA: Diagnosis not present

## 2023-02-19 MED ORDER — ABEMACICLIB 150 MG PO TABS
150.0000 mg | ORAL_TABLET | Freq: Two times a day (BID) | ORAL | 0 refills | Status: DC
  Filled 2023-02-19: qty 56, 28d supply, fill #0

## 2023-02-19 MED ORDER — ANASTROZOLE 1 MG PO TABS: 1.0000 mg | ORAL_TABLET | Freq: Every day | ORAL | 3 refills | Status: DC

## 2023-02-19 MED ORDER — ALPRAZOLAM 0.25 MG PO TABS: 0.2500 mg | ORAL_TABLET | Freq: Every evening | ORAL | 0 refills | Status: DC | PRN

## 2023-02-19 NOTE — Progress Notes (Signed)
[facility]   [phone] [phone]   Clinic Follow up Note   Patient Care Team: [name], MD as PCP - General (Family Medicine) Talmage Coin, MD as Attending Physician (Internal Medicine) Pershing Proud, RN as Oncology Nurse Navigator Donnelly Angelica, RN as Oncology Nurse Navigator Malachy Mood, MD as Consulting Physician (Hematology)  Date of Service:  02/19/2023  CHIEF COMPLAINT: f/u of  Left Breast Cancer, ER+   CURRENT THERAPY:  Anastrozole  starting 01/2023 Verzenio-pending   ASSESSMENT:  Kaitlin Barnes is a 72 y.o. female with   Breast cancer metastasized to axillary lymph node, left (HCC) Stage IV, ([id number]), ER+/PR+/HER2 low expression, Grade 2, with bone metastasis  -Patient presented with a palpable left breast mass and enlarged left axillary lymph nodes in April 2024.  Previous mammogram 1 year ago was negative. -I reviewed her breast and left axillary lymph node biopsy, both showed grade 2 invasive ductal carcinoma, ER 100% and PR 20% strongly positive, HER2 IHC 2+, FISH negative. -I personally reviewed her PET scan images with patient and her husband, it showed hypermetabolic left breast, left axilla and subpectoral nodes, and multiple spinal bone mets.  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  Prognostic panel results still pending. -Due to her metastatic disease, upfront breast surgery is not recommended.  Patient was seen by breast surgeon Dr. Donell Beers today.   -I recommend first-line systemic therapy with aromatase inhibitor and CDK 4/6 inhibitor, anastrozole and Verzenio - The potential benefit and side effects, which includes but not limited to, hot flash, skin and vaginal dryness, metabolic changes ( increased blood glucose, cholesterol, weight, etc.), slightly in increased risk of cardiovascular disease, cataracts, muscular and joint discomfort, osteopenia and  osteoporosis, cytopenias, especially risk of infection from neutropenia, diarrhea, etc, were discussed with her in great details. She is interested, and she was started this week -The goal of therapy is disease control, disease unfortunately not curable.   Metastasis to bone [facility]) -She has metastatic breast cancer to bones, confirmed by biopsy. -We discussed cancer related bone pain and risk of fracture, she is asymptomatic now. -I recommend Zometa infusion every 3 months, to reduce her risk of fracture.  Potential benefit and side effect, especially risk of jaw necrosis, and dental care, were discussed with her in detail.  She is interested -Plan to start in a few weeks  Leg pain and edema -started 2-3 weeks, intermittent  -Will obtain Doppler of lower extremities to rule out DVT today   PLAN: -I order an ultra doppler request  today -Reviewed Biopsy results with pt. -I recommend Zometa  q3 months for bone strengthen and OTC Calcium and Vitamin D, I also given a letter to her dentist for clearance of Zometa -I prescribe Anastrozole/Verzenio, she was started anastrozole this week, and Verzenio when she receives the medicine. -lab and f/u in 4 weeks with first dose zometa    SUMMARY OF ONCOLOGIC HISTORY: Oncology History Overview Note   Cancer Staging  Breast cancer metastasized to axillary lymph node, left (HCC) Staging form: Breast, AJCC 8th Edition - Clinical stage from 02/01/2023: Stage IV (cT2, cN1(f), cM1, G2, ER+, PR+, HER2: Equivocal) - Signed by Malachy Mood, MD on 02/08/2023 Stage prefix: Initial diagnosis Method of lymph node assessment: Fine needle aspiration Histologic grading system: 3 grade system  CLL (chronic lymphocytic leukemia) (HCC) Staging form: Chronic Lymphocytic Leukemia / Small Lymphocytic Lymphoma, AJCC 8th Edition - Clinical stage from  08/24/2021: Modified Rai Stage I (Modified Rai risk: Intermediate, Lymphocytosis: Present, Adenopathy: Present, Organomegaly:  Absent, Anemia: Absent, Thrombocytopenia: Absent) - Signed by Artis Delay, MD on 09/03/2022 Stage prefix: Initial diagnosis     CLL (chronic lymphocytic leukemia) (HCC)  06/12/2012 Initial Diagnosis   CLL (chronic lymphocytic leukemia)   07/20/2014 Pathology Results   FISH was normal   08/24/2021 Cancer Staging   Staging form: Chronic Lymphocytic Leukemia / Small Lymphocytic Lymphoma, AJCC 8th Edition - Clinical stage from 08/24/2021: Modified Rai Stage I (Modified Rai risk: Intermediate, Lymphocytosis: Present, Adenopathy: Present, Organomegaly: Absent, Anemia: Absent, Thrombocytopenia: Absent) - Signed by Artis Delay, MD on 09/03/2022 Stage prefix: Initial diagnosis   09/03/2022 Imaging   1. Isolated enlarged 16 mm short axis left axillary node. No other lymphadenopathy in the chest, abdomen, or pelvis. 2. 2.1 cm right thyroid nodule. Recommend thyroid US (ref: J Am Coll Radiol. 2015 Feb;12(2): 143-50). 3. Tiny bilateral nonobstructing renal stones.   Breast cancer metastasized to axillary lymph node, left (HCC)  01/23/2023 Imaging   IMPRESSION: 1. 2.3 cm mass in the 3 o'clock position of the left breast with imaging features highly suspicious for primary breast cancer. 2. 3.1 cm markedly enlarged left axillary lymph node most likely representing a metastatic node associated with the patient's probable primary breast cancer. A single enlarged lymph node associated with the patient's chronic lymphocytic leukemia is less likely.   RECOMMENDATION: 1. Ultrasound-guided core needle biopsy of the 2.3 cm mass in the 3 o'clock position of the left breast. 2. Ultrasound-guided core needle biopsy of the 3.1 cm enlarged left axillary lymph node. This has been discussed with the patient and her husband and the biopsies have been scheduled at 12:45 p.m. on 01/29/2023.   01/29/2023 Pathology Results   Patient: Kaitlin Barnes Collected: 01/29/2023 Client: [facility]  Imaging Accession: [id number] Received: 01/29/2023 Amie Portland, MD DOB: [birth year] Age: 61 Gender: F Reported: 01/30/2023 [address] Patient Ph: [phone] MRN #: [id number] [place], [place] [address] Client Acc#: Chart #: [id number] Phone: [phone] Fax: CC: [facility] INTERNAL CC CC: [name] REPORT OF SURGICAL PATHOLOGY Addendum: Breast Biomarker Results FINAL DIAGNOSIS Diagnosis 1. Breast, left, needle core biopsy, 3 o'clock, mass, coil clip - INVASIVE MAMMARY CARCINOMA, SEE NOTE - MAMMARY CARCINOMA IN SITU, INTERMEDIATE GRADE - TUBULE FORMATION: SCORE 3 - NUCLEAR PLEOMORPHISM: SCORE 3 - MITOTIC COUNT: SCORE 1 - TOTAL SCORE: 7 - OVERALL GRADE: 2 - LYMPHOVASCULAR INVASION: NOT IDENTIFIED - CANCER LENGTH: 1.3 CM - CALCIFICATIONS: PRESENT, RARE - OTHER FINDINGS: INTRADUCTAL PAPILLOMA WITH ASSOCIATED MICROCALCIFICATION 2. Lymph node, needle/core biopsy, axilla, lymph node, hydromark clip - METASTATIC CARCINOMA - GREATEST DIMENSION: 1.2 CM - NO DEFINITIVE LYMPHOID TISSUE IDENTIFIED - SEE COMMENT Diagnosis Note 1. -2. Dr. Luisa Hart reviewed the case and concurs with the interpretation. Immunohistochemical staining for E-cadherin and a breast prognostic profile (ER, PR, Ki-67 and HER2) on block 1A is pending and will be reported in an addendum. 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  1. Breast, left, needle core biopsy, 3 o'clock, mass, coil clip PROGNOSTIC INDICATORS Results: IMMUNOHISTOCHEMICAL AND MORPHOMETRIC ANALYSIS PERFORMED MANUALLY The tumor cells are EQUIVOCAL for Her2 (2+). Her2 by FISH will be performed and the results reported separately. Estrogen Receptor: 100%, POSITIVE, STRONG STAINING INTENSITY Progesterone Receptor: 20%, POSITIVE, STRONG STAINING INTENSITY Proliferation Marker Ki67: 45%    02/01/2023 Initial Diagnosis   Breast cancer metastasized to axillary lymph node, left (HCC)   02/01/2023 Cancer Staging  Staging form: Breast, AJCC 8th Edition - Clinical stage from 02/01/2023: Stage IV (cT2, cN1(f), cM1, G2, ER+, PR+, HER2: Equivocal) - Signed by Malachy Mood, MD on 02/08/2023 Stage prefix: Initial diagnosis Method of lymph node assessment: Fine needle aspiration Histologic grading system: 3 grade system   02/07/2023 PET scan   1. Hypermetabolic left breast mass consistent with known primary breast malignancy. 2. Hypermetabolic nodal metastases in the left axilla and subpectoral regions. 3. No evidence of metastatic disease within the abdomen or pelvis. 4. Several hypermetabolic osseous lesions in the thoracolumbar spine and right ribs, consistent with osseous metastatic disease. 5. Nonobstructing bilateral renal calculi.      INTERVAL HISTORY:  Kaitlin Barnes is here for a follow up of  Left Breast Cancer, ER+. She was last seen by me on 02/08/2023. She presents to the clinic accompanied by husband. Pt state that she has been having diarrhea since March. Pt has been taking Imodium for the system. Pt state that she goes to the bathroom 2 times a day with loose stools. Pt also report of having leg pain in the right leg and some swelling and tingling., It all started after last visit wit me 5/17.Pt also had to use her walker at home. Pt does  have an appointment with an Orthopedic today.      All other systems were reviewed with the patient and are negative.  MEDICAL HISTORY:  Past Medical History:  Diagnosis Date   CLL (chronic lymphoblastic leukemia)    CLL (chronic lymphocytic leukemia) (HCC) 06/12/2012   Family history of breast cancer    Family history of ovarian cancer    Family history of prostate cancer    Fever 03/28/2016   Hypercalcemia    Hyperparathyroidism, unspecified (HCC)    Hypothyroidism    Lung nodule 07/16/2013    SURGICAL HISTORY: Past Surgical History:  Procedure Laterality Date   BREAST BIOPSY Left    benign   BREAST BIOPSY Left 01/29/2023   times 2   BREAST  BIOPSY Left 01/29/2023   [place] LT BREAST BX W LOC DEV 1ST LESION IMG BX SPEC US GUIDE 01/29/2023 [facility] MAMMOGRAPHY   IR FLUORO GUIDED NEEDLE PLC ASPIRATION/INJECTION LOC  02/14/2023   KNEE ARTHROSCOPY Left    THYROIDECTOMY      I have reviewed the social history and family history with the patient and they are unchanged from previous note.  ALLERGIES:  has No Known Allergies.  MEDICATIONS:  Current Outpatient Medications  Medication Sig Dispense Refill   abemaciclib (VERZENIO) 150 MG tablet Take 1 tablet (150 mg total) by mouth 2 (two) times daily. 56 tablet 0   ALPRAZolam (XANAX) 0.25 MG tablet Take 1 tablet (0.25 mg total) by mouth at bedtime as needed for anxiety. 20 tablet 0   anastrozole (ARIMIDEX) 1 MG tablet Take 1 tablet (1 mg total) by mouth daily. 30 tablet 3   aspirin EC 81 MG tablet Take 81 mg by mouth daily. Swallow whole.     cholecalciferol (VITAMIN D) 1000 UNITS tablet Take 2,000 Units by mouth daily.     levothyroxine (SYNTHROID) 75 MCG tablet Take 75 mcg by mouth daily.     Multiple Vitamin (MULTIVITAMIN) tablet Take 1 tablet by mouth daily.     omeprazole (PRILOSEC) 20 MG capsule Take 20 mg by mouth daily.      No current facility-administered medications for this visit.    PHYSICAL EXAMINATION: ECOG PERFORMANCE STATUS: 1 - Symptomatic but completely ambulatory  Vitals:  02/19/23 0928  BP: (!) 160/75  Pulse: 78  Resp: 16  Temp: 98.3 F (36.8 C)  SpO2: 97%   Wt Readings from Last 3 Encounters:  02/19/23 171 lb 1.6 oz (77.6 kg)  02/14/23 170 lb (77.1 kg)  02/08/23 170 lb 11.2 oz (77.4 kg)     GENERAL:alert, no distress and comfortable SKIN: skin color normal, no rashes or significant lesions EYES: normal, Conjunctiva are pink and non-injected, sclera clear  NEURO: alert & oriented x 3 with fluent speech  LABORATORY DATA:  I have reviewed the data as listed    Latest Ref Rng & Units 02/01/2023   12:54 PM 08/24/2022    9:21 AM 08/24/2021    8:51 AM  CBC   WBC 4.0 - 10.5 K/uL 66.4  47.2  41.2   Hemoglobin 12.0 - 15.0 g/dL 16.1  09.6  04.5   Hematocrit 36.0 - 46.0 % 46.2  40.4  43.4   Platelets 150 - 400 K/uL 250  208  227         Latest Ref Rng & Units 02/01/2023   12:54 PM 08/30/2022    7:01 PM 04/30/2021    6:09 PM  CMP  Glucose 70 - 99 mg/dL 88   84   BUN 8 - 23 mg/dL 15   15   Creatinine 4.09 - 1.00 mg/dL 8.11  9.14  7.82   Sodium 135 - 145 mmol/L 141   143   Potassium 3.5 - 5.1 mmol/L 4.3   3.9   Chloride 98 - 111 mmol/L 107   109   CO2 22 - 32 mmol/L 30   27   Calcium 8.9 - 10.3 mg/dL 95.6   9.8   Total Protein 6.5 - 8.1 g/dL 7.1     Total Bilirubin 0.3 - 1.2 mg/dL 0.4     Alkaline Phos 38 - 126 U/L 99     AST 15 - 41 U/L 19     ALT 0 - 44 U/L 21         RADIOGRAPHIC STUDIES: I have personally reviewed the radiological images as listed and agreed with the findings in the report. VAS [place] LOWER EXTREMITY VENOUS (DVT)  Result Date: 02/19/2023  Lower Venous DVT Study Patient Name:  Kaitlin Barnes  Date of Exam:   02/19/2023 Medical Rec #: [id number]      Accession #:    [id number] Date of Birth: 11-Jun-1951       Patient Gender: F Patient Age:   15 years Exam Location:  [facility] Procedure:      VAS [place] LOWER EXTREMITY VENOUS (DVT) Referring Phys: Malachy Mood --------------------------------------------------------------------------------  Indications: RLE pain.  Risk Factors: Metastatic breast cancer. Comparison Study: Previous LLEV exam on 04/30/21 was negative for DVT Performing Technologist: Ernestene Mention RVT, RDMS  Examination Guidelines: A complete evaluation includes B-mode imaging, spectral Doppler, color Doppler, and power Doppler as needed of all accessible portions of each vessel. Bilateral testing is considered an integral part of a complete examination. Limited examinations for reoccurring indications may be performed as noted. The reflux portion of the exam is performed with the patient in reverse Trendelenburg.   +---------+---------------+---------+-----------+----------+-------------------+ RIGHT    CompressibilityPhasicitySpontaneityPropertiesThrombus Aging      +---------+---------------+---------+-----------+----------+-------------------+ CFV      Full           Yes      Yes                                      +---------+---------------+---------+-----------+----------+-------------------+  SFJ      Full                                                             +---------+---------------+---------+-----------+----------+-------------------+ FV Prox  Full           Yes      Yes                                      +---------+---------------+---------+-----------+----------+-------------------+ FV Mid   Full           Yes      Yes                                      +---------+---------------+---------+-----------+----------+-------------------+ FV DistalFull           Yes      Yes                                      +---------+---------------+---------+-----------+----------+-------------------+ PFV      Full                                                             +---------+---------------+---------+-----------+----------+-------------------+ POP      Full           Yes      Yes                                      +---------+---------------+---------+-----------+----------+-------------------+ PTV      Full                                         Not well visualized +---------+---------------+---------+-----------+----------+-------------------+ PERO     Full                                                             +---------+---------------+---------+-----------+----------+-------------------+   +---------+---------------+---------+-----------+----------+--------------+ LEFT     CompressibilityPhasicitySpontaneityPropertiesThrombus Aging +---------+---------------+---------+-----------+----------+--------------+ CFV      Full            Yes      Yes                                 +---------+---------------+---------+-----------+----------+--------------+ SFJ      Full                                                        +---------+---------------+---------+-----------+----------+--------------+  FV Prox  Full           Yes      Yes                                 +---------+---------------+---------+-----------+----------+--------------+ FV Mid   Full           Yes      Yes                                 +---------+---------------+---------+-----------+----------+--------------+ FV DistalFull           Yes      Yes                                 +---------+---------------+---------+-----------+----------+--------------+ PFV      Full                                                        +---------+---------------+---------+-----------+----------+--------------+ POP      Full           Yes      Yes                                 +---------+---------------+---------+-----------+----------+--------------+ PTV      Full                                                        +---------+---------------+---------+-----------+----------+--------------+ PERO     Full                                                        +---------+---------------+---------+-----------+----------+--------------+     Summary: BILATERAL: - No evidence of deep vein thrombosis seen in the lower extremities, bilaterally. -No evidence of popliteal cyst, bilaterally. RIGHT: - A cystic structure is found in the popliteal fossa (3.5 x 0.5 x 1.7 cm).   *See table(s) above for measurements and observations. Electronically signed by Lemar Livings MD on 02/19/2023 at 1:03:59 PM.    Final       No orders of the defined types were placed in this encounter.  All questions were answered. The patient knows to call the clinic with any problems, questions or concerns. No barriers to learning was detected. The total  time spent in the appointment was 40 minutes.     Malachy Mood, MD 02/19/2023   Carolin Coy, CMA, am acting as scribe for Malachy Mood, MD.   I have reviewed the above documentation for accuracy and completeness, and I agree with the above.

## 2023-02-19 NOTE — Telephone Encounter (Signed)
Oral Oncology Patient Advocate Encounter  Prior Authorization for Kathlen Mody has been approved.    PA# [id number] Effective dates: 02/19/23 through 02/19/24  Patients co-pay is $3,306.31.    Jinger Neighbors, CPhT-Adv Oncology Pharmacy Patient Advocate [facility] Direct Number: [phone]  Fax: [phone]

## 2023-02-19 NOTE — Telephone Encounter (Signed)
Oral Oncology Patient Advocate Encounter   Received notification that prior authorization for Verzenio is required.   PA submitted on 02/19/23 Key [id number] Status is pending     Jinger Neighbors, CPhT-Adv Oncology Pharmacy Patient Advocate [facility] Direct Number: [phone]  Fax: [phone]

## 2023-02-19 NOTE — Telephone Encounter (Signed)
Oral Oncology Pharmacist Encounter  Received new prescription for Verzenio (abemaciclib) for the treatment of metastatic breast cancer, ER/PR positive, HER-2 negative in conjunction with anastrozole, planned duration until disease progression or unacceptable drug toxicity.  CBC w/ Diff and CMP from 02/01/23 assessed, noted WBC 66.4 K/uL (hxo CLL). No baseline dose adjustments required at this time. Prescription dose and frequency assessed for appropriateness.  Current medication list in Epic reviewed, no relevant/significant DDIs with Verzenio identified.  Evaluated chart and no patient barriers to medication adherence noted.   Patient agreement for treatment documented in MD note on 02/19/23.  Prescription has been e-scribed to the [facility] for benefits analysis and approval.  Oral Oncology Clinic will continue to follow for insurance authorization, copayment issues, initial counseling and start date.  Lenord Carbo, PharmD, BCPS, BCOP Hematology/Oncology Clinical Pharmacist [facility] Oral Chemotherapy Navigation Clinics [phone] 02/19/2023 11:26 AM

## 2023-02-19 NOTE — Telephone Encounter (Signed)
Oral Oncology Patient Advocate Encounter   Began application for assistance for Verzenio through [employer].   Application will be submitted upon completion of necessary supporting documentation.  Patient has agreed to come and sign forms in person on 02/25/23   [employer] phone number [phone].   I will continue to check the status until final determination.   Jinger Neighbors, CPhT-Adv Oncology Pharmacy Patient Advocate [facility] Direct Number: [phone]  Fax: [phone]

## 2023-02-21 ENCOUNTER — Other Ambulatory Visit (HOSPITAL_COMMUNITY): Payer: PPO

## 2023-02-25 ENCOUNTER — Other Ambulatory Visit (HOSPITAL_COMMUNITY): Payer: PPO

## 2023-02-25 NOTE — Telephone Encounter (Signed)
Oral Oncology Patient Advocate Encounter   Submitted application for assistance for Verzenio to [employer].   Application submitted via e-fax to [phone]   [facility] phone number [phone].   I will continue to check the status until final determination.   Jinger Neighbors, CPhT-Adv Oncology Pharmacy Patient Advocate [facility] Direct Number: [phone]  Fax: [phone]

## 2023-02-26 NOTE — Telephone Encounter (Signed)
Oral Oncology Patient Advocate Encounter   Received notification that the application for assistance for Verzenio through [employer] has been approved.   [employer] phone number [phone].   Effective dates: 02/26/23 through 09/24/23  I have spoken to the patient.  Kiandria Clum, CPhT-Adv Oncology Pharmacy Patient Advocate [facility] Direct Number: [phone]  Fax: [phone]   

## 2023-02-26 NOTE — Telephone Encounter (Signed)
Oral Chemotherapy Pharmacist Encounter  I spoke with patient for overview of: Verzenio (abemaciclib) for the treatment of metastatic breast cancer, ER/PR positive, HER-2 negative in conjunction with anastrozole, planned duration until disease progression or unacceptable drug toxicity.   Counseled patient on administration, dosing, side effects, monitoring, drug-food interactions, safe handling, storage, and disposal.  Patient will take Verzenio 150mg  tablets, 1 tablet by mouth twice daily without regard to food.  Patient knows to avoid grapefruit and grapefruit juice.  Verzenio start date: 02/28/23  Adverse effects include but are not limited to: diarrhea, fatigue, nausea, abdominal pain, decreased blood counts, and increased liver function tests, and joint pains. Severe, life-threatening, and/or fatal interstitial lung disease (ILD) and/or pneumonitis may occur with CDK 4/6 inhibitors.  Patient has anti-emetic on hand and knows to take it if nausea develops.   Patient will obtain anti diarrheal and alert the office of 4 or more loose stools above baseline.  Reviewed with patient importance of keeping a medication schedule and plan for any missed doses. No barriers to medication adherence identified.  Medication reconciliation performed and medication/allergy list updated.  All questions answered.  Mr. And [name] voiced understanding and appreciation.   Medication education handout placed in mail for patient. Patient knows to call the office with questions or concerns. Oral Chemotherapy Clinic phone number provided to patient.   Kaitlin Barnes, PharmD, BCPS, [facility] Hematology/Oncology Clinical Pharmacist [facility] Oral Chemotherapy Navigation Clinics [phone] 02/26/2023 3:59 PM

## 2023-02-28 ENCOUNTER — Encounter: Payer: Self-pay | Admitting: *Deleted

## 2023-03-05 ENCOUNTER — Other Ambulatory Visit: Payer: PPO

## 2023-03-05 ENCOUNTER — Ambulatory Visit: Payer: PPO | Admitting: Hematology and Oncology

## 2023-03-15 ENCOUNTER — Other Ambulatory Visit: Payer: Self-pay

## 2023-03-15 ENCOUNTER — Other Ambulatory Visit: Payer: Self-pay | Admitting: Hematology

## 2023-03-17 ENCOUNTER — Other Ambulatory Visit: Payer: Self-pay | Admitting: Nurse Practitioner

## 2023-03-17 DIAGNOSIS — C50912 Malignant neoplasm of unspecified site of left female breast: Secondary | ICD-10-CM

## 2023-03-17 DIAGNOSIS — C7951 Secondary malignant neoplasm of bone: Secondary | ICD-10-CM

## 2023-03-17 NOTE — Progress Notes (Unsigned)
Patient Care Team: [name], MD as PCP - General (Family Medicine) Talmage Coin, MD as Attending Physician (Internal Medicine) Pershing Proud, RN as Oncology Nurse Navigator Donnelly Angelica, RN as Oncology Nurse Navigator Malachy Mood, MD as Consulting Physician (Hematology)   CHIEF COMPLAINT: Follow up metastatic breast cancer   Oncology History Overview Note   Cancer Staging  Breast cancer metastasized to axillary lymph node, left [facility]) Staging form: Breast, AJCC 8th Edition - Clinical stage from 02/01/2023: Stage IV (cT2, cN1(f), cM1, G2, ER+, PR+, HER2: Equivocal) - Signed by Malachy Mood, MD on 02/08/2023 Stage prefix: Initial diagnosis Method of lymph node assessment: Fine needle aspiration Histologic grading system: 3 grade system  CLL (chronic lymphocytic leukemia) (HCC) Staging form: Chronic Lymphocytic Leukemia / Small Lymphocytic Lymphoma, AJCC 8th Edition - Clinical stage from 08/24/2021: Modified Rai Stage I (Modified Rai risk: Intermediate, Lymphocytosis: Present, Adenopathy: Present, Organomegaly: Absent, Anemia: Absent, Thrombocytopenia: Absent) - Signed by Artis Delay, MD on 09/03/2022 Stage prefix: Initial diagnosis     CLL (chronic lymphocytic leukemia) (HCC)  06/12/2012 Initial Diagnosis   CLL (chronic lymphocytic leukemia)   07/20/2014 Pathology Results   FISH was normal   08/24/2021 Cancer Staging   Staging form: Chronic Lymphocytic Leukemia / Small Lymphocytic Lymphoma, AJCC 8th Edition - Clinical stage from 08/24/2021: Modified Rai Stage I (Modified Rai risk: Intermediate, Lymphocytosis: Present, Adenopathy: Present, Organomegaly: Absent, Anemia: Absent, Thrombocytopenia: Absent) - Signed by Artis Delay, MD on 09/03/2022 Stage prefix: Initial diagnosis   09/03/2022 Imaging   1. Isolated enlarged 16 mm short axis left axillary node. No other lymphadenopathy in the chest, abdomen, or pelvis. 2. 2.1 cm right thyroid nodule. Recommend thyroid US (ref:  J Am Coll Radiol. 2015 Feb;12(2): 143-50). 3. Tiny bilateral nonobstructing renal stones.   Breast cancer metastasized to axillary lymph node, left (HCC)  01/23/2023 Imaging   IMPRESSION: 1. 2.3 cm mass in the 3 o'clock position of the left breast with imaging features highly suspicious for primary breast cancer. 2. 3.1 cm markedly enlarged left axillary lymph node most likely representing a metastatic node associated with the patient's probable primary breast cancer. A single enlarged lymph node associated with the patient's chronic lymphocytic leukemia is less likely.   RECOMMENDATION: 1. Ultrasound-guided core needle biopsy of the 2.3 cm mass in the 3 o'clock position of the left breast. 2. Ultrasound-guided core needle biopsy of the 3.1 cm enlarged left axillary lymph node. This has been discussed with the patient and her husband and the biopsies have been scheduled at 12:45 p.m. on 01/29/2023.   01/29/2023 Pathology Results   Patient: Kaitlin Barnes Collected: 01/29/2023 Client: [facility] Accession: [id number] Received: 01/29/2023 Amie Portland, MD DOB: [birth year] Age: 72 Gender: F Reported: 01/30/2023 [address] Patient Ph: [phone] MRN #: [id number] [place], [place] [address] Client Acc#: Chart #: [id number] Phone: [phone] Fax: CC: [facility] CC CC: [name] REPORT OF SURGICAL PATHOLOGY Addendum: Breast Biomarker Results FINAL DIAGNOSIS Diagnosis 1. Breast, left, needle core biopsy, 3 o'clock, mass, coil clip - INVASIVE MAMMARY CARCINOMA, SEE NOTE - MAMMARY CARCINOMA IN SITU, INTERMEDIATE GRADE - TUBULE FORMATION: SCORE 3 - NUCLEAR PLEOMORPHISM: SCORE 3 - MITOTIC COUNT: SCORE 1 - TOTAL SCORE: 7 - OVERALL GRADE: 2 - LYMPHOVASCULAR INVASION: NOT IDENTIFIED - CANCER LENGTH: 1.3 CM - CALCIFICATIONS: PRESENT, RARE - OTHER FINDINGS: INTRADUCTAL PAPILLOMA WITH ASSOCIATED MICROCALCIFICATION 2. Lymph node, needle/core biopsy, axilla, lymph  node, hydromark clip - METASTATIC CARCINOMA -  GREATEST DIMENSION: 1.2 CM - NO DEFINITIVE LYMPHOID TISSUE IDENTIFIED - SEE COMMENT Diagnosis Note 1. -2. Dr. Luisa Hart reviewed the case and concurs with the interpretation. Immunohistochemical staining for E-cadherin and a breast prognostic profile (ER, PR, Ki-67 and HER2) on block 1A is pending and will be reported in an addendum. 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  1. Breast, left, needle core biopsy, 3 o'clock, mass, coil clip PROGNOSTIC INDICATORS Results: IMMUNOHISTOCHEMICAL AND MORPHOMETRIC ANALYSIS PERFORMED MANUALLY The tumor cells are EQUIVOCAL for Her2 (2+). Her2 by FISH will be performed and the results reported separately. Estrogen Receptor: 100%, POSITIVE, STRONG STAINING INTENSITY Progesterone Receptor: 20%, POSITIVE, STRONG STAINING INTENSITY Proliferation Marker Ki67: 45%    02/01/2023 Initial Diagnosis   Breast cancer metastasized to axillary lymph node, left (HCC)   02/01/2023 Cancer Staging   Staging form: Breast, AJCC 8th Edition - Clinical stage from 02/01/2023: Stage IV (cT2, cN1(f), cM1, G2, ER+, PR+, HER2: Equivocal) - Signed by Malachy Mood, MD on 02/08/2023 Stage prefix: Initial diagnosis Method of lymph node assessment: Fine needle aspiration Histologic grading system: 3 grade system   02/07/2023 PET scan   1. Hypermetabolic left breast mass consistent with known primary breast malignancy. 2. Hypermetabolic nodal metastases in the left axilla and subpectoral regions. 3. No evidence of metastatic disease within the abdomen or pelvis. 4. Several hypermetabolic osseous lesions in the thoracolumbar spine and right ribs, consistent with osseous metastatic disease. 5. Nonobstructing bilateral renal calculi.      CURRENT THERAPY:  Anastrozole po once daily starting 01/2023 Verzenio starting ~02/23/23 Zometa q3 months, starting 6/24   INTERVAL HISTORY Ms. Beste returns for  follow up as scheduled, last seen by Dr. Mosetta Putt 02/19/23. She continues anastrozole and began verzenio in the interim. Tolerating mostly well. Hot flashes from AI have resolved. She has fatigue and periodic diarrhea (3 episodes) from verzenio. Watching her diet and taking imodium which helps. Continues daily activities. Right knee pain is improving with NSAIDs. Due to start zometa today, received dental clearance, no oral issues.  Her husband has covid, but she does not; testing negative.   ROS  All other systems reviewed and negative   Past Medical History:  Diagnosis Date   CLL (chronic lymphoblastic leukemia)    CLL (chronic lymphocytic leukemia) (HCC) 06/12/2012   Family history of breast cancer    Family history of ovarian cancer    Family history of prostate cancer    Fever 03/28/2016   Hypercalcemia    Hyperparathyroidism, unspecified (HCC)    Hypothyroidism    Lung nodule 07/16/2013     Past Surgical History:  Procedure Laterality Date   BREAST BIOPSY Left    benign   BREAST BIOPSY Left 01/29/2023   times 2   BREAST BIOPSY Left 01/29/2023   [place] LT BREAST BX W LOC DEV 1ST LESION IMG BX SPEC US GUIDE 01/29/2023 [facility] MAMMOGRAPHY   IR FLUORO GUIDED NEEDLE PLC ASPIRATION/INJECTION LOC  02/14/2023   KNEE ARTHROSCOPY Left    THYROIDECTOMY       Outpatient Encounter Medications as of 03/19/2023  Medication Sig   abemaciclib (VERZENIO) 150 MG tablet Take 1 tablet (150 mg total) by mouth 2 (two) times daily.   ALPRAZolam (XANAX) 0.25 MG tablet Take 1 tablet (0.25 mg total) by mouth at bedtime as needed for anxiety.   anastrozole (ARIMIDEX) 1 MG tablet Take 1 tablet (1 mg total) by mouth daily.   cholecalciferol (VITAMIN D) 1000 UNITS tablet Take 2,000 Units by  mouth daily.   levothyroxine (SYNTHROID) 75 MCG tablet Take 75 mcg by mouth daily.   Multiple Vitamin (MULTIVITAMIN) tablet Take 1 tablet by mouth daily.   omeprazole (PRILOSEC) 20 MG capsule Take 20 mg by mouth daily.     [DISCONTINUED] aspirin EC 81 MG tablet Take 81 mg by mouth daily. Swallow whole.   No facility-administered encounter medications on file as of 03/19/2023.     Today's Vitals   03/19/23 0820 03/19/23 0821  BP: (!) 152/69   Pulse: 83   Resp: 17   Temp: 98.1 F (36.7 C)   TempSrc: Temporal   SpO2: 99%   Weight: 167 lb 6.4 oz (75.9 kg)   PainSc:  0-No pain   Body mass index is 30.62 kg/m.   PHYSICAL EXAM GENERAL:alert, no distress and comfortable SKIN: no rash  EYES: sclera clear LUNGS: clear with normal breathing effort HEART: regular rate & rhythm, no lower extremity edema ABDOMEN: abdomen soft, non-tender and normal bowel sounds NEURO: alert & oriented x 3 with fluent speech, no focal motor/sensory deficits MSK. No spinal tenderness Breast exam: deferred   CBC    Component Value Date/Time   WBC 64.9 (HH) 03/19/2023 0752   WBC 47.2 (H) 08/24/2022 0921   RBC 3.97 03/19/2023 0752   HGB 12.1 03/19/2023 0752   HGB 13.9 07/22/2017 0939   HCT 38.4 03/19/2023 0752   HCT 42.3 07/22/2017 0939   PLT 117 (L) 03/19/2023 0752   PLT 217 07/22/2017 0939   MCV 96.7 03/19/2023 0752   MCV 92.4 07/22/2017 0939   MCH 30.5 03/19/2023 0752   MCHC 31.5 03/19/2023 0752   RDW 13.6 03/19/2023 0752   RDW 13.5 07/22/2017 0939   LYMPHSABS PENDING 03/19/2023 0752   LYMPHSABS 31.4 (H) 07/22/2017 0939   MONOABS PENDING 03/19/2023 0752   MONOABS 0.7 07/22/2017 0939   EOSABS PENDING 03/19/2023 0752   EOSABS 0.2 07/22/2017 0939   BASOSABS PENDING 03/19/2023 0752   BASOSABS 0.1 07/22/2017 0939     CMP     Component Value Date/Time   NA 141 03/19/2023 0752   NA 143 07/19/2016 0910   K 3.8 03/19/2023 0752   K 4.6 07/19/2016 0910   CL 109 03/19/2023 0752   CL 104 12/11/2012 1408   CO2 28 03/19/2023 0752   CO2 28 07/19/2016 0910   GLUCOSE 91 03/19/2023 0752   GLUCOSE 115 07/19/2016 0910   GLUCOSE 94 12/11/2012 1408   BUN 14 03/19/2023 0752   BUN 11.5 07/19/2016 0910   CREATININE  0.89 03/19/2023 0752   CREATININE 0.8 07/19/2016 0910   CALCIUM 10.4 (H) 03/19/2023 0752   CALCIUM 10.6 (H) 07/19/2016 0910   PROT 6.1 (L) 03/19/2023 0752   PROT 6.9 07/19/2016 0910   ALBUMIN 3.7 03/19/2023 0752   ALBUMIN 3.8 07/19/2016 0910   AST 15 03/19/2023 0752   AST 28 07/19/2016 0910   ALT 15 03/19/2023 0752   ALT 52 07/19/2016 0910   ALKPHOS 79 03/19/2023 0752   ALKPHOS 114 07/19/2016 0910   BILITOT 0.4 03/19/2023 0752   BILITOT 0.32 07/19/2016 0910   GFRNONAA >60 03/19/2023 0752   GFRAA >60 07/22/2018 1111     ASSESSMENT & PLAN:Kaitlin Barnes is a 72 y.o. female with    Breast cancer metastasized to axillary lymph node, left Stage IV, ([id number]), ER+/PR+/HER2 low expression, Grade 2, with bone metastasis  -Patient presented with a palpable left breast mass and enlarged left axillary lymph nodes in [address].  Previous mammogram  1 year ago was negative. -Breast and left axillary lymph node biopsy, both showed grade 2 invasive ductal carcinoma, ER 100% and PR 20% strongly positive, HER2 IHC 2+, FISH negative. -PET scan showed hypermetabolic left breast, left axilla and subpectoral nodes, and multiple spinal bone mets.  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  Prognostic panel showed ER+ PR - and HER2 - -baseline CA 27.29 and 15.3 both normal  -She began first line palliative anastrozole in 01/2023 and verzenio BID 02/28/23, tolerating well with mild fatigue and intermittent diarrhea. SEs are adequately managed with supportive care at home. Able to function with good PS. No clinical evidence of disease progression -Labs reviewed; WBC 64 (CLL), plt 117K, no anemia; CMP is stable Ca 10.4.  -Continue daily Anastrozole, Verzenio 150 mg BID, and start Zometa (q3 months) today -F/up monthly   Chronic lymphocytic leukemia (CLL) -WBC increased significantly from December/2023 to May/2024 up to 66 -She has been followed by Dr.  Bertis Ruddy, asymptomatic from CLL, and no other cytopenias, there has been no indication for treatment -WBC 64 today; monitoring   Right knee/leg pain -Onset with breast cancer dx -Bilateral doppler 02/19/23 negative for DVT, but showed cystic structure in the right popliteal fossa strain 3.5 x 0.5 x 1.7 cm -Managing with NSAIDs as needed    PLAN: -Labs reviewed; WBC 64 (CLL), plt 117K, no anemia; CMP is stable Ca 10.4 -Continue daily Anastrozole, Verzenio 150 mg BID (refilled), and start Zometa (q3 months) today -Lab and f/up monthly  -Next Zometa 05/2023    All questions were answered. The patient knows to call the clinic with any problems, questions or concerns. No barriers to learning were detected. I spent 20 minutes counseling the patient face to face. The total time spent in the appointment was 30 minutes and more than 50% was on counseling, review of test results, and coordination of care.   Santiago Glad, NP-C 03/19/2023

## 2023-03-18 ENCOUNTER — Other Ambulatory Visit: Payer: Self-pay

## 2023-03-19 ENCOUNTER — Inpatient Hospital Stay: Payer: PPO

## 2023-03-19 ENCOUNTER — Encounter: Payer: Self-pay | Admitting: Nurse Practitioner

## 2023-03-19 ENCOUNTER — Other Ambulatory Visit (HOSPITAL_COMMUNITY): Payer: Self-pay

## 2023-03-19 ENCOUNTER — Inpatient Hospital Stay: Payer: PPO | Attending: Hematology and Oncology

## 2023-03-19 ENCOUNTER — Inpatient Hospital Stay (HOSPITAL_BASED_OUTPATIENT_CLINIC_OR_DEPARTMENT_OTHER): Payer: PPO | Admitting: Nurse Practitioner

## 2023-03-19 ENCOUNTER — Other Ambulatory Visit: Payer: Self-pay | Admitting: Pharmacist

## 2023-03-19 ENCOUNTER — Other Ambulatory Visit: Payer: Self-pay

## 2023-03-19 VITALS — BP 152/69 | HR 83 | Temp 98.1°F | Resp 17 | Wt 167.4 lb

## 2023-03-19 VITALS — BP 152/68 | HR 78 | Temp 98.2°F | Resp 18

## 2023-03-19 DIAGNOSIS — C50912 Malignant neoplasm of unspecified site of left female breast: Secondary | ICD-10-CM

## 2023-03-19 DIAGNOSIS — C773 Secondary and unspecified malignant neoplasm of axilla and upper limb lymph nodes: Secondary | ICD-10-CM

## 2023-03-19 DIAGNOSIS — C7951 Secondary malignant neoplasm of bone: Secondary | ICD-10-CM

## 2023-03-19 DIAGNOSIS — C9110 Chronic lymphocytic leukemia of B-cell type not having achieved remission: Secondary | ICD-10-CM

## 2023-03-19 LAB — CBC WITH DIFFERENTIAL (CANCER CENTER ONLY)
Abs Immature Granulocytes: 0.05 10*3/uL (ref 0.00–0.07)
Basophils Absolute: 0 10*3/uL (ref 0.0–0.1)
Basophils Relative: 0 %
Eosinophils Absolute: 0.1 10*3/uL (ref 0.0–0.5)
Eosinophils Relative: 0 %
HCT: 38.4 % (ref 36.0–46.0)
Hemoglobin: 12.1 g/dL (ref 12.0–15.0)
Immature Granulocytes: 0 %
Lymphocytes Relative: 96 %
Lymphs Abs: 62 10*3/uL — ABNORMAL HIGH (ref 0.7–4.0)
MCH: 30.5 pg (ref 26.0–34.0)
MCHC: 31.5 g/dL (ref 30.0–36.0)
MCV: 96.7 fL (ref 80.0–100.0)
Monocytes Absolute: 0.3 10*3/uL (ref 0.1–1.0)
Monocytes Relative: 0 %
Neutro Abs: 2.5 10*3/uL (ref 1.7–7.7)
Neutrophils Relative %: 4 %
Platelet Count: 117 10*3/uL — ABNORMAL LOW (ref 150–400)
RBC: 3.97 MIL/uL (ref 3.87–5.11)
RDW: 13.6 % (ref 11.5–15.5)
Smear Review: NORMAL
WBC Count: 64.9 10*3/uL (ref 4.0–10.5)
nRBC: 0 % (ref 0.0–0.2)

## 2023-03-19 LAB — CMP (CANCER CENTER ONLY)
ALT: 15 U/L (ref 0–44)
AST: 15 U/L (ref 15–41)
Albumin: 3.7 g/dL (ref 3.5–5.0)
Alkaline Phosphatase: 79 U/L (ref 38–126)
Anion gap: 4 — ABNORMAL LOW (ref 5–15)
BUN: 14 mg/dL (ref 8–23)
CO2: 28 mmol/L (ref 22–32)
Calcium: 10.4 mg/dL — ABNORMAL HIGH (ref 8.9–10.3)
Chloride: 109 mmol/L (ref 98–111)
Creatinine: 0.89 mg/dL (ref 0.44–1.00)
GFR, Estimated: >60 mL/min (ref >60)
Glucose, Bld: 91 mg/dL (ref 70–99)
Potassium: 3.8 mmol/L (ref 3.5–5.1)
Sodium: 141 mmol/L (ref 135–145)
Total Bilirubin: 0.4 mg/dL (ref 0.3–1.2)
Total Protein: 6.1 g/dL — ABNORMAL LOW (ref 6.5–8.1)

## 2023-03-19 MED ORDER — ABEMACICLIB 150 MG PO TABS
150.0000 mg | ORAL_TABLET | Freq: Two times a day (BID) | ORAL | 0 refills | Status: DC
  Filled 2023-03-19: qty 56, 28d supply, fill #0

## 2023-03-19 MED ORDER — ABEMACICLIB 150 MG PO TABS: 150.0000 mg | ORAL_TABLET | Freq: Two times a day (BID) | ORAL | 0 refills | Status: DC

## 2023-03-19 MED ORDER — SODIUM CHLORIDE 0.9 % IV SOLN: Freq: Once | INTRAVENOUS | Status: AC

## 2023-03-19 MED ORDER — ZOLEDRONIC ACID 4 MG/100ML IV SOLN
4.0000 mg | Freq: Once | INTRAVENOUS | Status: AC
  Administered 2023-03-19: 4 mg via INTRAVENOUS
  Filled 2023-03-19: qty 100

## 2023-03-19 NOTE — Patient Instructions (Signed)

## 2023-03-19 NOTE — Progress Notes (Signed)
Oral Oncology Pharmacist Encounter   Patient enrolled in patient assistance through [employer] and fills Verzenio through [facility]	[place]. Verzenio has been redirected for dispensing.     Lenord Carbo, PharmD, BCPS, [facility] Hematology/Oncology Clinical Pharmacist [facility] Oral Chemotherapy Navigation Clinics [phone] 03/19/2023 9:29 AM

## 2023-03-19 NOTE — Progress Notes (Signed)
Critical lab value reported:  WBC 64.9.  Santiago Glad, NP notified.

## 2023-03-20 ENCOUNTER — Telehealth: Payer: Self-pay

## 2023-03-20 NOTE — Telephone Encounter (Signed)
Late entry:  Pt called this morning stated she got treatment yesterday and after d/c home pt stated she starting having chills which pt thought was related to her chemo tx.  Pt stated this morning she is still having chills so pt decided to take another covid test which came back positive for covid.  Pt stated her husband had Covid on Sunday and now she has Covid.  Pt stated she's contacted her PCP to get Plaxlovid and wanted to know if she could hold the Verzenio until she starts feeling better.  Informed pt that this RN would have to consult with Santiago Glad, NP.  Verbal order w/readback given from Santiago Glad, NP to hold the [facility] for 1 wk, if pt's is still having symptoms contact Dr. Latanya Maudlin office for further directions regarding Verzenio.  Informed pt of Lacie's orders.  Pt verbalized understanding and had no further questions or concerns.

## 2023-03-21 ENCOUNTER — Ambulatory Visit: Payer: Self-pay | Admitting: Genetic Counselor

## 2023-03-21 ENCOUNTER — Telehealth: Payer: Self-pay | Admitting: Genetic Counselor

## 2023-03-21 DIAGNOSIS — C50912 Malignant neoplasm of unspecified site of left female breast: Secondary | ICD-10-CM

## 2023-03-21 DIAGNOSIS — Z1379 Encounter for other screening for genetic and chromosomal anomalies: Secondary | ICD-10-CM

## 2023-03-21 NOTE — Progress Notes (Addendum)
HPI:  Ms. Schmude was previously seen in the [facility] due to a personal history of breast cancer and concerns regarding a hereditary predisposition to cancer. Please refer to our prior cancer genetics clinic note for more information regarding our discussion, assessment and recommendations, at the time. Ms. Flannigan recent genetic test results were disclosed to her, as were recommendations warranted by these results. These results and recommendations are discussed in more detail below.  CANCER HISTORY:  Oncology History Overview Note   Cancer Staging  Breast cancer metastasized to axillary lymph node, left (HCC) Staging form: Breast, AJCC 8th Edition - Clinical stage from 02/01/2023: Stage IV (cT2, cN1(f), cM1, G2, ER+, PR+, HER2: Equivocal) - Signed by Malachy Mood, MD on 02/08/2023 Stage prefix: Initial diagnosis Method of lymph node assessment: Fine needle aspiration Histologic grading system: 3 grade system  CLL (chronic lymphocytic leukemia) (HCC) Staging form: Chronic Lymphocytic Leukemia / Small Lymphocytic Lymphoma, AJCC 8th Edition - Clinical stage from 08/24/2021: Modified Rai Stage I (Modified Rai risk: Intermediate, Lymphocytosis: Present, Adenopathy: Present, Organomegaly: Absent, Anemia: Absent, Thrombocytopenia: Absent) - Signed by Artis Delay, MD on 09/03/2022 Stage prefix: Initial diagnosis     CLL (chronic lymphocytic leukemia) (HCC)  06/12/2012 Initial Diagnosis   CLL (chronic lymphocytic leukemia)   07/20/2014 Pathology Results   FISH was normal   08/24/2021 Cancer Staging   Staging form: Chronic Lymphocytic Leukemia / Small Lymphocytic Lymphoma, AJCC 8th Edition - Clinical stage from 08/24/2021: Modified Rai Stage I (Modified Rai risk: Intermediate, Lymphocytosis: Present, Adenopathy: Present, Organomegaly: Absent, Anemia: Absent, Thrombocytopenia: Absent) - Signed by Artis Delay, MD on 09/03/2022 Stage prefix: Initial diagnosis   09/03/2022 Imaging    1. Isolated enlarged 16 mm short axis left axillary node. No other lymphadenopathy in the chest, abdomen, or pelvis. 2. 2.1 cm right thyroid nodule. Recommend thyroid US (ref: J Am Coll Radiol. 2015 Feb;12(2): 143-50). 3. Tiny bilateral nonobstructing renal stones.   Breast cancer metastasized to axillary lymph node, left (HCC)  01/23/2023 Imaging   IMPRESSION: 1. 2.3 cm mass in the 3 o'clock position of the left breast with imaging features highly suspicious for primary breast cancer. 2. 3.1 cm markedly enlarged left axillary lymph node most likely representing a metastatic node associated with the patient's probable primary breast cancer. A single enlarged lymph node associated with the patient's chronic lymphocytic leukemia is less likely.   RECOMMENDATION: 1. Ultrasound-guided core needle biopsy of the 2.3 cm mass in the 3 o'clock position of the left breast. 2. Ultrasound-guided core needle biopsy of the 3.1 cm enlarged left axillary lymph node. This has been discussed with the patient and her husband and the biopsies have been scheduled at 12:45 p.m. on 01/29/2023.   01/29/2023 Pathology Results   Patient: [name] Collected: 01/29/2023 Client: [facility] Accession: [id number] Received: 01/29/2023 Amie Portland, MD DOB: [birth year] Age: 89 Gender: F Reported: 01/30/2023 [address] Patient Ph: [phone] MRN #: [id number] [place], [place] [address] Client Acc#: Chart #: [id number] Phone: [phone] Fax: CC: [facility] CC CC: [name] REPORT OF SURGICAL PATHOLOGY Addendum: Breast Biomarker Results FINAL DIAGNOSIS Diagnosis 1. Breast, left, needle core biopsy, 3 o'clock, mass, coil clip - INVASIVE MAMMARY CARCINOMA, SEE NOTE - MAMMARY CARCINOMA IN SITU, INTERMEDIATE GRADE - TUBULE FORMATION: SCORE 3 - NUCLEAR PLEOMORPHISM: SCORE 3 - MITOTIC COUNT: SCORE 1 - TOTAL SCORE: 7 - OVERALL GRADE: 2 - LYMPHOVASCULAR INVASION: NOT IDENTIFIED -  CANCER LENGTH: 1.3 CM -  CALCIFICATIONS: PRESENT, RARE - OTHER FINDINGS: INTRADUCTAL PAPILLOMA WITH ASSOCIATED MICROCALCIFICATION 2. Lymph node, needle/core biopsy, axilla, lymph node, hydromark clip - METASTATIC CARCINOMA - GREATEST DIMENSION: 1.2 CM - NO DEFINITIVE LYMPHOID TISSUE IDENTIFIED - SEE COMMENT Diagnosis Note 1. -2. Dr. Luisa Hart reviewed the case and concurs with the interpretation. Immunohistochemical staining for E-cadherin and a breast prognostic profile (ER, PR, Ki-67 and HER2) on block 1A is pending and will be reported in an addendum. 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  1. Breast, left, needle core biopsy, 3 o'clock, mass, coil clip PROGNOSTIC INDICATORS Results: IMMUNOHISTOCHEMICAL AND MORPHOMETRIC ANALYSIS PERFORMED MANUALLY The tumor cells are EQUIVOCAL for Her2 (2+). Her2 by FISH will be performed and the results reported separately. Estrogen Receptor: 100%, POSITIVE, STRONG STAINING INTENSITY Progesterone Receptor: 20%, POSITIVE, STRONG STAINING INTENSITY Proliferation Marker Ki67: 45%    02/01/2023 Initial Diagnosis   Breast cancer metastasized to axillary lymph node, left (HCC)   02/01/2023 Cancer Staging   Staging form: Breast, AJCC 8th Edition - Clinical stage from 02/01/2023: Stage IV (cT2, cN1(f), cM1, G2, ER+, PR+, HER2: Equivocal) - Signed by Malachy Mood, MD on 02/08/2023 Stage prefix: Initial diagnosis Method of lymph node assessment: Fine needle aspiration Histologic grading system: 3 grade system   02/07/2023 PET scan   1. Hypermetabolic left breast mass consistent with known primary breast malignancy. 2. Hypermetabolic nodal metastases in the left axilla and subpectoral regions. 3. No evidence of metastatic disease within the abdomen or pelvis. 4. Several hypermetabolic osseous lesions in the thoracolumbar spine and right ribs, consistent with osseous metastatic disease. 5. Nonobstructing bilateral renal  calculi.     FAMILY HISTORY:  We obtained a detailed, 4-generation family history.  Significant diagnoses are listed below: Family History  Problem Relation Age of Onset   Breast cancer Mother        dx. 60s   Prostate cancer Father    Breast cancer Sister 105   Prostate cancer Brother 36   Multiple myeloma Brother    Ovarian cancer Paternal Aunt 30   Breast cancer Paternal Aunt    Ovarian cancer Paternal Aunt    Cancer Paternal Uncle        unknown   Breast cancer Maternal Grandmother        dx. 60s-70s   Breast cancer Paternal Grandmother    Ovarian cancer Paternal Grandmother        The patient has a son and daughter who are cancer free.  She has a sister who was diagnosed with breast cancer around age 52, a brother who was diagnosed with prostate cancer at 1 and another brother with multiple myeloma.  Both parents are deceased.   The patient's mother was diagnosed with breast cancer in her 67's. She had multiple siblings who were reported to be cancer free.  The maternal grandmother was diagnosed with breast cancer in her 2's.   The patient's father died of prostate cancer at 47.  He had two sister and two brothers.  On sister had ovarian cancer, another sister had breast and ovarian cancer and a brother had an unknown cancer.  The paternal grandparents are deceased.  The grandmother had breast and ovarian cancer.   Ms. Smetana is unaware of previous family history of genetic testing for hereditary cancer risks. Patient's maternal ancestors are of Caucasian descent, and paternal ancestors are of [place] descent. There is no reported Ashkenazi Jewish ancestry. There is no known consanguinity.  GENETIC TEST RESULTS: Genetic testing reported out  on March 18, 2023 through the CancerNext-Expanded cancer panel found no pathogenic mutations. The CancerNext-Expanded gene panel offered by [facility] and includes sequencing and rearrangement analysis for the following 71 genes: AIP,  ALK, APC, ATM, BAP1, BARD1, BMPR1A, BRCA1, BRCA2, BRIP1, CDC73, CDH1, CDK4, CDKN1B, CDKN2A, CHEK2, DICER1, FH, FLCN, KIF1B, LZTR1, MAX, MEN1, MET, MLH1, MSH2, MSH6, MUTYH, NF1, NF2, NTHL1, PALB2, PHOX2B, PMS2, POT1, PRKAR1A, PTCH1, PTEN, RAD51C, RAD51D, RB1, RET, SDHA, SDHAF2, SDHB, SDHC, SDHD, SMAD4, SMARCA4, SMARCB1, SMARCE1, STK11, SUFU, TMEM127, TP53, TSC1, TSC2 and VHL (sequencing and deletion/duplication); AXIN2, CTNNA1, EGFR, EGLN1, HOXB13, KIT, MITF, MSH3, PDGFRA, POLD1 and POLE (sequencing only); EPCAM and GREM1 (deletion/duplication only). RNA data is routinely analyzed for use in variant interpretation for all genes.  The test report has been scanned into EPIC and is located under the Molecular Pathology section of the Results Review tab.  A portion of the result report is included below for reference.     We discussed with Ms. Orama that because current genetic testing is not perfect, it is possible there may be a gene mutation in one of these genes that current testing cannot detect, but that chance is small.  We also discussed, that there could be another gene that has not yet been discovered, or that we have not yet tested, that is responsible for the cancer diagnoses in the family. It is also possible there is a hereditary cause for the cancer in the family that Ms. Truszkowski did not inherit and therefore was not identified in her testing.  Therefore, it is important to remain in touch with cancer genetics in the future so that we can continue to offer Ms. Cofer the most up to date genetic testing.   ADDITIONAL GENETIC TESTING: We discussed with Ms. Rieb that her genetic testing was fairly extensive.  If there are genes identified to increase cancer risk that can be analyzed in the future, we would be happy to discuss and coordinate this testing at that time.    CANCER SCREENING RECOMMENDATIONS: Ms. Deleo test result is considered negative (normal).  This means that we have not identified  a hereditary cause for her personal and family history of breast cancer at this time. Most cancers happen by chance and this negative test suggests that her cancer may fall into this category.    Possible reasons for Ms. Shanafelt's negative genetic test include:  1. There may be a gene mutation in one of these genes that current testing methods cannot detect but that chance is small.  2. There could be another gene that has not yet been discovered, or that we have not yet tested, that is responsible for the cancer diagnoses in the family.  3.  There may be no hereditary risk for cancer in the family. The cancers in Ms. Baisch and/or her family may be sporadic/familial or due to other genetic and environmental factors. 4. It is also possible there is a hereditary cause for the cancer in the family that Ms. Baglio did not inherit.  Therefore, it is recommended she continue to follow the cancer management and screening guidelines provided by her oncology and primary healthcare provider. An individual's cancer risk and medical management are not determined by genetic test results alone. Overall cancer risk assessment incorporates additional factors, including personal medical history, family history, and any available genetic information that may result in a personalized plan for cancer prevention and surveillance   RECOMMENDATIONS FOR FAMILY MEMBERS:  Individuals in this family might be  at some increased risk of developing cancer, over the general population risk, simply due to the family history of cancer.  We recommended women in this family have a yearly mammogram beginning at age 51, or 62 years younger than the earliest onset of cancer, an annual clinical breast exam, and perform monthly breast self-exams. Women in this family should also have a gynecological exam as recommended by their primary provider. All family members should be referred for colonoscopy starting at age 58.  FOLLOW-UP: Lastly, we  discussed with Ms. Mullenbach that cancer genetics is a rapidly advancing field and it is possible that new genetic tests will be appropriate for her and/or her family members in the future. We encouraged her to remain in contact with cancer genetics on an annual basis so we can update her personal and family histories and let her know of advances in cancer genetics that may benefit this family.   Our contact number was provided. Ms. Channel questions were answered to her satisfaction, and she knows she is welcome to call us at anytime with additional questions or concerns.   Maylon Cos, MS, [facility] Licensed, Certified Genetic Counselor [email or web address]

## 2023-03-21 NOTE — Telephone Encounter (Signed)
Revealed negative genetic testing.  Discussed that we do not know why she has breast cancer or why there is cancer in the family. It could be due to a different gene that we are not testing, or maybe our current technology may not be able to pick something up.  It will be important for her to keep in contact with genetics to keep up with whether additional testing may be needed. 

## 2023-03-21 NOTE — Assessment & Plan Note (Signed)
Negative genetic testing on the CancerNext-Expanded panel test.  The report date is March 18, 2023.  The CancerNext-Expanded gene panel offered by [facility] and includes sequencing and rearrangement analysis for the following 77 genes: AIP, ALK, APC, ATM, AXIN2, BAP1, BARD1, BLM, BMPR1A, BRCA1, BRCA2, BRIP1, CDC73, CDH1, CDK4, CDKN1B, CDKN2A, CHEK2, CTNNA1, DICER1, FANCC, FH, FLCN, GALNT12, KIF1B, LZTR1, MAX, MEN1, MET, MLH1, MSH2, MSH3, MSH6, MUTYH, NBN, NF1, NF2, NTHL1, PALB2, PHOX2B, PMS2, POT1, PRKAR1A, PTCH1, PTEN, RAD51C, RAD51D, RB1, RECQL, RET, SDHA, SDHAF2, SDHB, SDHC, SDHD, SMAD4, SMARCA4, SMARCB1, SMARCE1, STK11, SUFU, TMEM127, TP53, TSC1, TSC2, VHL and XRCC2 (sequencing and deletion/duplication); EGFR, EGLN1, HOXB13, KIT, MITF, PDGFRA, POLD1, and POLE (sequencing only); EPCAM and GREM1 (deletion/duplication only).

## 2023-04-01 ENCOUNTER — Telehealth: Payer: Self-pay

## 2023-04-01 ENCOUNTER — Other Ambulatory Visit (HOSPITAL_COMMUNITY): Payer: Self-pay

## 2023-04-01 NOTE — Telephone Encounter (Signed)
Spoke with pt via telephone regarding c/o diarrhea and food as well as c/o rash on chest.  Pt stated she has lost 10 lbs in 1 month and everything the pt eats is causing diarrhea.  Pt just recently had Covid and was prescribed Paxlovid.  Pt's Verzenio was held while pt had Covid.  Pt stated she restarted Verzenio last week.  Pt wants to know what can she eat that will not cause diarrhea.  Pt stated she's been eating canned fruit, bananas, peanut butter, scrambled eggs, egg salad, and some dairy but not a lot.  Pt stated she ate 1/2 steak on Saturday 03/30/2023 and she then developed diarrhea later that night.  Pt described the stools went from solid to brown liquid.  Pt stated during this time she drunk 4 oz of Gatorade Zero.  Pt stated she drank a cap full of Imodium which help but pt was scared of becoming constipated.  Pt stated she drinks Gatorade Zero, Ginger Ale, and water daily.  Pt drinks 1 bottle of Gatorade Zero daily, 1 16 oz glass of Ginger Ale, and 3 to 4 12 oz bottles of water daily.  Instructed pt to increase the amount of Gatorade to 4 to 5 12 oz bottles when pt is experiencing episodes of diarrhea to replenish electrolytes lost.  Also, instructed pt to take the Imodium sooner than after the 3 episode of watery stools.  Pt verbalized understanding.  Pt stated she was going to eat stuffed pepper this evening for dinner but is afraid to eat them due the thing going on with her stomach and bowels.  Instructed pt to eat bland foods to help settle pt's stomach.  Pt has hx of acid reflux and did c/o acid reflux after eating the steak on 03/30/2023.  Pt confirmed taking Omeprazole (Prilosec) 20 mg daily.  Instructed pt to continue taking the Omeprazole and to eat a bland diet until stomach has settled.  Pt verbalized understanding and stated she will change her diet.  This RN addressed the rash the pt c/o on her chest.  Pt stated she currently was not putting anything on the rash d/t not sure what she could  use.  Pt c/o severe itching especially at night when pt has hot flash throughout the night.  Instructed pt to apply hydrocortisone cream to the rash and to take Diphenhydramine 25 mg PO daily or Loratadine 10 mg PO BID daily for the rash.  Instructed pt to try to stay cool to help minimize the itching.  Pt verbalized understanding.  This RN consulted with Dr. Mosetta Putt regarding the pt's c/os and this nurse's assessment.  Dr. Mosetta Putt is in agreement with plan of care.  Pt has f/u appt with Dr. Mosetta Putt on 04/17/2023.  Informed pt to contact Dr. Latanya Maudlin office if symptoms does not improve.  Pt verbalized understanding and had no further questions or concerns.

## 2023-04-04 ENCOUNTER — Other Ambulatory Visit: Payer: PPO

## 2023-04-04 ENCOUNTER — Encounter: Payer: PPO | Admitting: Genetic Counselor

## 2023-04-16 ENCOUNTER — Other Ambulatory Visit: Payer: Self-pay

## 2023-04-16 DIAGNOSIS — C50912 Malignant neoplasm of unspecified site of left female breast: Secondary | ICD-10-CM

## 2023-04-16 NOTE — Progress Notes (Signed)
[facility]   [phone] [phone]   Clinic Follow up Note   Patient Care Team: [name], MD as PCP - General (Family Medicine) Talmage Coin, MD as Attending Physician (Internal Medicine) Pershing Proud, RN as Oncology Nurse Navigator Donnelly Angelica, RN as Oncology Nurse Navigator Malachy Mood, MD as Consulting Physician (Hematology)  Date of Service:  04/17/2023  CHIEF COMPLAINT: f/u of metastatic breast cancer     CURRENT THERAPY:  Anastrozole po once daily starting 01/2023 Verzenio starting ~02/23/23 Zometa q3 months, starting 6/24   ASSESSMENT:  Kaitlin Barnes is a 72 y.o. female with   Breast cancer metastasized to axillary lymph node, left (HCC) Stage IV, ([id number]), ER+/PR+/HER2 low expression, Grade 2, with bone metastasis  -Patient presented with a palpable left breast mass and enlarged left axillary lymph nodes in April 2024.  Previous mammogram 1 year ago was negative. -I reviewed her breast and left axillary lymph node biopsy, both showed grade 2 invasive ductal carcinoma, ER 100% and PR 20% strongly positive, HER2 IHC 2+, FISH negative. -I personally reviewed her PET scan images with patient and her husband, it showed hypermetabolic left breast, left axilla and subpectoral nodes, and multiple spinal bone mets.  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  Prognostic panel results still pending. -Due to her metastatic disease, upfront breast surgery is not recommended.  Patient was seen by breast surgeon Dr. Donell Beers today.   -I recommend first-line systemic therapy with aromatase inhibitor and CDK 4/6 inhibitor, anastrozole and Verzenio, she started in June 2024 -She has moderate diarrhea from Verzenio, and moderate fatigue, she has not been able to function well due to her fatigue.  I recommend reduce Verzenio dose to 100 mg twice daily, I called in the new prescription -We  also discussed that she can slightly increase Imodium for her diarrhea -If she tolerates 100 mg well, we may let her try 100 mg in the morning and 150 mg in the evening -She is tolerating anastrozole very well  CLL (chronic lymphocytic leukemia) -stage 0, no indication for therapy -Will continue new monitoring CBC     PLAN: - I recommend increasing half dose of Imodium 2-3 times a day -lab reviewed hg 10.9 - reduce dose of Verzenio 100 mg in the AM  and 100 mg in the evening due to fatigue and diarrhea. - referral to Dietician -  I refill  Omeprazole  40 mg -I refill Xanax -lab and f/u 8/21 as scheduled   SUMMARY OF ONCOLOGIC HISTORY: Oncology History Overview Note   Cancer Staging  Breast cancer metastasized to axillary lymph node, left (HCC) Staging form: Breast, AJCC 8th Edition - Clinical stage from 02/01/2023: Stage IV (cT2, cN1(f), cM1, G2, ER+, PR+, HER2: Equivocal) - Signed by Malachy Mood, MD on 02/08/2023 Stage prefix: Initial diagnosis Method of lymph node assessment: Fine needle aspiration Histologic grading system: 3 grade system  CLL (chronic lymphocytic leukemia) (HCC) Staging form: Chronic Lymphocytic Leukemia / Small Lymphocytic Lymphoma, AJCC 8th Edition - Clinical stage from 08/24/2021: Modified Rai Stage I (Modified Rai risk: Intermediate, Lymphocytosis: Present, Adenopathy: Present, Organomegaly: Absent, Anemia: Absent, Thrombocytopenia: Absent) - Signed by Artis Delay, MD on 09/03/2022 Stage prefix: Initial diagnosis     CLL (chronic lymphocytic leukemia) (HCC)  06/12/2012 Initial Diagnosis   CLL (chronic lymphocytic leukemia)   07/20/2014 Pathology Results   FISH was normal   08/24/2021 Cancer Staging   Staging form: Chronic Lymphocytic  Leukemia / Small Lymphocytic Lymphoma, AJCC 8th Edition - Clinical stage from 08/24/2021: Modified Rai Stage I (Modified Rai risk: Intermediate, Lymphocytosis: Present, Adenopathy: Present, Organomegaly: Absent, Anemia:  Absent, Thrombocytopenia: Absent) - Signed by Artis Delay, MD on 09/03/2022 Stage prefix: Initial diagnosis   09/03/2022 Imaging   1. Isolated enlarged 16 mm short axis left axillary node. No other lymphadenopathy in the chest, abdomen, or pelvis. 2. 2.1 cm right thyroid nodule. Recommend thyroid US (ref: J Am Coll Radiol. 2015 Feb;12(2): 143-50). 3. Tiny bilateral nonobstructing renal stones.   Breast cancer metastasized to axillary lymph node, left (HCC)  01/23/2023 Imaging   IMPRESSION: 1. 2.3 cm mass in the 3 o'clock position of the left breast with imaging features highly suspicious for primary breast cancer. 2. 3.1 cm markedly enlarged left axillary lymph node most likely representing a metastatic node associated with the patient's probable primary breast cancer. A single enlarged lymph node associated with the patient's chronic lymphocytic leukemia is less likely.   RECOMMENDATION: 1. Ultrasound-guided core needle biopsy of the 2.3 cm mass in the 3 o'clock position of the left breast. 2. Ultrasound-guided core needle biopsy of the 3.1 cm enlarged left axillary lymph node. This has been discussed with the patient and her husband and the biopsies have been scheduled at 12:45 p.m. on 01/29/2023.   01/29/2023 Pathology Results   Patient: Kaitlin Barnes Collected: 01/29/2023 Client: [facility] Accession: [id number] Received: 01/29/2023 Kaitlin Portland, MD DOB: May 06, 1951 Age: 65 Gender: F Reported: 01/30/2023 [address] Patient Ph: [phone] MRN #: [id number] [place], [place] [address] Client Acc#: Chart #: [id number] Phone: [phone] Fax: CC: [facility] INTER[facility] CC CC: [name] REPORT OF SURGICAL PATHOLOGY Addendum: Breast Biomarker Results FINAL DIAGNOSIS Diagnosis 1. Breast, left, needle core biopsy, 3 o'clock, mass, coil clip - INVASIVE MAMMARY CARCINOMA, SEE NOTE - MAMMARY CARCINOMA IN SITU, INTERMEDIATE GRADE - TUBULE FORMATION: SCORE 3 - NUCLEAR  PLEOMORPHISM: SCORE 3 - MITOTIC COUNT: SCORE 1 - TOTAL SCORE: 7 - OVERALL GRADE: 2 - LYMPHOVASCULAR INVASION: NOT IDENTIFIED - CANCER LENGTH: 1.3 CM - CALCIFICATIONS: PRESENT, RARE - OTHER FINDINGS: INTRADUCTAL PAPILLOMA WITH ASSOCIATED MICROCALCIFICATION 2. Lymph node, needle/core biopsy, axilla, lymph node, hydromark clip - METASTATIC CARCINOMA - GREATEST DIMENSION: 1.2 CM - NO DEFINITIVE LYMPHOID TISSUE IDENTIFIED - SEE COMMENT Diagnosis Note 1. -2. Dr. Luisa Hart reviewed the case and concurs with the interpretation. Immunohistochemical staining for E-cadherin and a breast prognostic profile (ER, PR, Ki-67 and HER2) on block 1A is pending and will be reported in an addendum. 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  1. Breast, left, needle core biopsy, 3 o'clock, mass, coil clip PROGNOSTIC INDICATORS Results: IMMUNOHISTOCHEMICAL AND MORPHOMETRIC ANALYSIS PERFORMED MANUALLY The tumor cells are EQUIVOCAL for Her2 (2+). Her2 by FISH will be performed and the results reported separately. Estrogen Receptor: 100%, POSITIVE, STRONG STAINING INTENSITY Progesterone Receptor: 20%, POSITIVE, STRONG STAINING INTENSITY Proliferation Marker Ki67: 45%    02/01/2023 Initial Diagnosis   Breast cancer metastasized to axillary lymph node, left (HCC)   02/01/2023 Cancer Staging   Staging form: Breast, AJCC 8th Edition - Clinical stage from 02/01/2023: Stage IV (cT2, cN1(f), cM1, G2, ER+, PR+, HER2: Equivocal) - Signed by Malachy Mood, MD on 02/08/2023 Stage prefix: Initial diagnosis Method of lymph node assessment: Fine needle aspiration Histologic grading system: 3 grade system   02/07/2023 PET scan   1. Hypermetabolic left breast mass consistent with known primary breast malignancy. 2. Hypermetabolic nodal metastases in the  left axilla and subpectoral regions. 3. No evidence of metastatic disease within the abdomen or pelvis. 4. Several hypermetabolic osseous  lesions in the thoracolumbar spine and right ribs, consistent with osseous metastatic disease. 5. Nonobstructing bilateral renal calculi.      INTERVAL HISTORY:  Kaitlin Barnes is here for a follow up of metastatic breast cancer . She was last seen by NP Lacie on 03/19/2023. She presents to the clinic accompanied by husband. Pt stated that she has Diarrhea since starting the Verzenio. Pt stated that its not as bad as it was in the beginning. Pt state that she has been having diarrhea since March of this year. Pt state that she has a rash on her chest and she is taking Hydrocortisone for relief.      All other systems were reviewed with the patient and are negative.  MEDICAL HISTORY:  Past Medical History:  Diagnosis Date   CLL (chronic lymphoblastic leukemia)    CLL (chronic lymphocytic leukemia) (HCC) 06/12/2012   Family history of breast cancer    Family history of ovarian cancer    Family history of prostate cancer    Fever 03/28/2016   Hypercalcemia    Hyperparathyroidism, unspecified (HCC)    Hypothyroidism    Lung nodule 07/16/2013    SURGICAL HISTORY: Past Surgical History:  Procedure Laterality Date   BREAST BIOPSY Left    benign   BREAST BIOPSY Left 01/29/2023   times 2   BREAST BIOPSY Left 01/29/2023   [place] LT BREAST BX W LOC DEV 1ST LESION IMG BX SPEC US GUIDE 01/29/2023 [facility] MAMMOGRAPHY   IR FLUORO GUIDED NEEDLE PLC ASPIRATION/INJECTION LOC  02/14/2023   KNEE ARTHROSCOPY Left    THYROIDECTOMY      I have reviewed the social history and family history with the patient and they are unchanged from previous note.  ALLERGIES:  has No Known Allergies.  MEDICATIONS:  Current Outpatient Medications  Medication Sig Dispense Refill   abemaciclib (VERZENIO) 100 MG tablet Take 1 tablet (100 mg total) by mouth 2 (two) times daily. 56 tablet 0   omeprazole (PRILOSEC) 40 MG capsule Take 1 capsule (40 mg total) by mouth daily. 30 capsule 2   ALPRAZolam (XANAX) 0.25 MG tablet  Take 1 tablet (0.25 mg total) by mouth at bedtime as needed for anxiety. 30 tablet 0   anastrozole (ARIMIDEX) 1 MG tablet Take 1 tablet (1 mg total) by mouth daily. 30 tablet 3   cholecalciferol (VITAMIN D) 1000 UNITS tablet Take 2,000 Units by mouth daily.     levothyroxine (SYNTHROID) 75 MCG tablet Take 75 mcg by mouth daily.     Multiple Vitamin (MULTIVITAMIN) tablet Take 1 tablet by mouth daily.     No current facility-administered medications for this visit.    PHYSICAL EXAMINATION: ECOG PERFORMANCE STATUS: 2 - Symptomatic, <50% confined to bed  Vitals:   04/17/23 1204  BP: (!) 154/79  Pulse: 81  Resp: 19  Temp: 98.7 F (37.1 C)  SpO2: 100%   Wt Readings from Last 3 Encounters:  04/17/23 163 lb 6.4 oz (74.1 kg)  03/19/23 167 lb 6.4 oz (75.9 kg)  02/19/23 171 lb 1.6 oz (77.6 kg)     GENERAL:alert, no distress and comfortable SKIN: skin color normal, no rashes or significant lesions EYES: normal, Conjunctiva are pink and non-injected, sclera clear  NEURO: alert & oriented x 3 with fluent speech LABORATORY DATA:  I have reviewed the data as listed    Latest Ref  Rng & Units 04/17/2023   11:19 AM 03/19/2023    7:52 AM 02/01/2023   12:54 PM  CBC  WBC 4.0 - 10.5 K/uL 57.4  64.9  66.4   Hemoglobin 12.0 - 15.0 g/dL 16.1  09.6  04.5   Hematocrit 36.0 - 46.0 % 33.2  38.4  46.2   Platelets 150 - 400 K/uL 157  117  250         Latest Ref Rng & Units 04/17/2023   11:19 AM 03/19/2023    7:52 AM 02/01/2023   12:54 PM  CMP  Glucose 70 - 99 mg/dL 409  91  88   BUN 8 - 23 mg/dL 15  14  15    Creatinine 0.44 - 1.00 mg/dL 8.11  9.14  7.82   Sodium 135 - 145 mmol/L 141  141  141   Potassium 3.5 - 5.1 mmol/L 3.9  3.8  4.3   Chloride 98 - 111 mmol/L 108  109  107   CO2 22 - 32 mmol/L 29  28  30    Calcium 8.9 - 10.3 mg/dL 95.6  21.3  08.6   Total Protein 6.5 - 8.1 g/dL 5.7  6.1  7.1   Total Bilirubin 0.3 - 1.2 mg/dL 0.4  0.4  0.4   Alkaline Phos 38 - 126 U/L 65  79  99   AST 15 -  41 U/L 15  15  19    ALT 0 - 44 U/L 14  15  21        RADIOGRAPHIC STUDIES: I have personally reviewed the radiological images as listed and agreed with the findings in the report. No results found.    Orders Placed This Encounter  Procedures   Ambulatory Referral to [facility]    Referral Priority:   Routine    Referral Type:   Consultation    Referral Reason:   Specialty Services Required    Number of Visits Requested:   1   All questions were answered. The patient knows to call the clinic with any problems, questions or concerns. No barriers to learning was detected. The total time spent in the appointment was 30 minutes.     Malachy Mood, MD 04/17/2023   Carolin Coy, CMA, am acting as scribe for Malachy Mood, MD.   I have reviewed the above documentation for accuracy and completeness, and I agree with the above.

## 2023-04-17 ENCOUNTER — Encounter: Payer: Self-pay | Admitting: *Deleted

## 2023-04-17 ENCOUNTER — Inpatient Hospital Stay: Payer: PPO

## 2023-04-17 ENCOUNTER — Other Ambulatory Visit: Payer: Self-pay

## 2023-04-17 ENCOUNTER — Other Ambulatory Visit (HOSPITAL_COMMUNITY): Payer: Self-pay

## 2023-04-17 ENCOUNTER — Inpatient Hospital Stay: Payer: PPO | Attending: Hematology and Oncology | Admitting: Hematology

## 2023-04-17 ENCOUNTER — Encounter: Payer: Self-pay | Admitting: Hematology

## 2023-04-17 VITALS — BP 154/79 | HR 81 | Temp 98.7°F | Resp 19 | Wt 163.4 lb

## 2023-04-17 DIAGNOSIS — Z803 Family history of malignant neoplasm of breast: Secondary | ICD-10-CM | POA: Diagnosis not present

## 2023-04-17 DIAGNOSIS — C773 Secondary and unspecified malignant neoplasm of axilla and upper limb lymph nodes: Secondary | ICD-10-CM | POA: Diagnosis not present

## 2023-04-17 DIAGNOSIS — C50812 Malignant neoplasm of overlapping sites of left female breast: Secondary | ICD-10-CM | POA: Insufficient documentation

## 2023-04-17 DIAGNOSIS — C7951 Secondary malignant neoplasm of bone: Secondary | ICD-10-CM | POA: Insufficient documentation

## 2023-04-17 DIAGNOSIS — C50912 Malignant neoplasm of unspecified site of left female breast: Secondary | ICD-10-CM

## 2023-04-17 DIAGNOSIS — C9110 Chronic lymphocytic leukemia of B-cell type not having achieved remission: Secondary | ICD-10-CM | POA: Insufficient documentation

## 2023-04-17 DIAGNOSIS — Z170 Estrogen receptor positive status [ER+]: Secondary | ICD-10-CM | POA: Diagnosis not present

## 2023-04-17 DIAGNOSIS — R21 Rash and other nonspecific skin eruption: Secondary | ICD-10-CM | POA: Insufficient documentation

## 2023-04-17 DIAGNOSIS — R197 Diarrhea, unspecified: Secondary | ICD-10-CM | POA: Insufficient documentation

## 2023-04-17 LAB — CMP (CANCER CENTER ONLY)
ALT: 14 U/L (ref 0–44)
AST: 15 U/L (ref 15–41)
Albumin: 3.9 g/dL (ref 3.5–5.0)
Alkaline Phosphatase: 65 U/L (ref 38–126)
Anion gap: 4 — ABNORMAL LOW (ref 5–15)
BUN: 15 mg/dL (ref 8–23)
CO2: 29 mmol/L (ref 22–32)
Calcium: 10.4 mg/dL — ABNORMAL HIGH (ref 8.9–10.3)
Chloride: 108 mmol/L (ref 98–111)
Creatinine: 0.87 mg/dL (ref 0.44–1.00)
GFR, Estimated: >60 mL/min (ref >60)
Glucose, Bld: 107 mg/dL — ABNORMAL HIGH (ref 70–99)
Potassium: 3.9 mmol/L (ref 3.5–5.1)
Sodium: 141 mmol/L (ref 135–145)
Total Bilirubin: 0.4 mg/dL (ref 0.3–1.2)
Total Protein: 5.7 g/dL — ABNORMAL LOW (ref 6.5–8.1)

## 2023-04-17 LAB — CBC WITH DIFFERENTIAL (CANCER CENTER ONLY)
Abs Immature Granulocytes: 0.03 10*3/uL (ref 0.00–0.07)
Basophils Absolute: 0.1 10*3/uL (ref 0.0–0.1)
Basophils Relative: 0 %
Eosinophils Absolute: 0 10*3/uL (ref 0.0–0.5)
Eosinophils Relative: 0 %
HCT: 33.2 % — ABNORMAL LOW (ref 36.0–46.0)
Hemoglobin: 10.9 g/dL — ABNORMAL LOW (ref 12.0–15.0)
Immature Granulocytes: 0 %
Lymphocytes Relative: 96 %
Lymphs Abs: 55.1 10*3/uL — ABNORMAL HIGH (ref 0.7–4.0)
MCH: 31.7 pg (ref 26.0–34.0)
MCHC: 32.8 g/dL (ref 30.0–36.0)
MCV: 96.5 fL (ref 80.0–100.0)
Monocytes Absolute: 0.1 10*3/uL (ref 0.1–1.0)
Monocytes Relative: 0 %
Neutro Abs: 2 10*3/uL (ref 1.7–7.7)
Neutrophils Relative %: 4 %
Platelet Count: 157 10*3/uL (ref 150–400)
RBC: 3.44 MIL/uL — ABNORMAL LOW (ref 3.87–5.11)
RDW: 16.3 % — ABNORMAL HIGH (ref 11.5–15.5)
Smear Review: NORMAL
WBC Count: 57.4 10*3/uL (ref 4.0–10.5)
nRBC: 0 % (ref 0.0–0.2)

## 2023-04-17 MED ORDER — ABEMACICLIB 100 MG PO TABS: 100.0000 mg | ORAL_TABLET | Freq: Two times a day (BID) | ORAL | 0 refills | Status: DC

## 2023-04-17 MED ORDER — OMEPRAZOLE 40 MG PO CPDR: 40.0000 mg | DELAYED_RELEASE_CAPSULE | Freq: Every day | ORAL | 2 refills | Status: DC

## 2023-04-17 MED ORDER — ALPRAZOLAM 0.25 MG PO TABS: 0.2500 mg | ORAL_TABLET | Freq: Every evening | ORAL | 0 refills | Status: DC | PRN

## 2023-04-17 NOTE — Assessment & Plan Note (Signed)
-  stage 0, no indication for therapy -Will continue new monitoring CBC

## 2023-04-17 NOTE — Assessment & Plan Note (Signed)
Stage IV, ([id number]), ER+/PR+/HER2 low expression, Grade 2, with bone metastasis  -Patient presented with a palpable left breast mass and enlarged left axillary lymph nodes in April 2024.  Previous mammogram 1 year ago was negative. -I reviewed her breast and left axillary lymph node biopsy, both showed grade 2 invasive ductal carcinoma, ER 100% and PR 20% strongly positive, HER2 IHC 2+, FISH negative. -I personally reviewed her PET scan images with patient and her husband, it showed hypermetabolic left breast, left axilla and subpectoral nodes, and multiple spinal bone mets.  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  Prognostic panel results still pending. -Due to her metastatic disease, upfront breast surgery is not recommended.  Patient was seen by breast surgeon Dr. Donell Beers today.   -I recommend first-line systemic therapy with aromatase inhibitor and CDK 4/6 inhibitor, anastrozole and Verzenio, she started in June 2024

## 2023-04-19 ENCOUNTER — Other Ambulatory Visit: Payer: Self-pay

## 2023-04-19 ENCOUNTER — Telehealth: Payer: Self-pay

## 2023-04-19 ENCOUNTER — Other Ambulatory Visit: Payer: Self-pay | Admitting: Hematology

## 2023-04-19 MED ORDER — ALPRAZOLAM 0.25 MG PO TABS: 0.2500 mg | ORAL_TABLET | Freq: Every evening | ORAL | 0 refills | Status: DC | PRN

## 2023-04-19 MED ORDER — OMEPRAZOLE 40 MG PO CPDR: 40.0000 mg | DELAYED_RELEASE_CAPSULE | Freq: Every day | ORAL | 2 refills | Status: DC

## 2023-04-19 NOTE — Telephone Encounter (Signed)
Returned patient's call regarding missing prescriptions. Patient clarified that she was to have her Xanax and Prilosec refilled to her Psychologist, forensic.  Patient informed that her prescriptions would be sent over to her preferred [facility] pharmacy, as they had been refilled through her mail-order specialty pharmacy.  Patient confirmed pharmacy and aware that prescriptions will be called into the [facility] instead.  Patient appreciative of call. All questions answered.

## 2023-04-30 ENCOUNTER — Other Ambulatory Visit: Payer: Self-pay

## 2023-04-30 ENCOUNTER — Inpatient Hospital Stay: Payer: PPO | Attending: Hematology and Oncology | Admitting: Nutrition

## 2023-04-30 DIAGNOSIS — Z79811 Long term (current) use of aromatase inhibitors: Secondary | ICD-10-CM | POA: Insufficient documentation

## 2023-04-30 DIAGNOSIS — C773 Secondary and unspecified malignant neoplasm of axilla and upper limb lymph nodes: Secondary | ICD-10-CM | POA: Insufficient documentation

## 2023-04-30 DIAGNOSIS — C7951 Secondary malignant neoplasm of bone: Secondary | ICD-10-CM | POA: Insufficient documentation

## 2023-04-30 DIAGNOSIS — Z170 Estrogen receptor positive status [ER+]: Secondary | ICD-10-CM | POA: Insufficient documentation

## 2023-04-30 DIAGNOSIS — C9110 Chronic lymphocytic leukemia of B-cell type not having achieved remission: Secondary | ICD-10-CM | POA: Insufficient documentation

## 2023-04-30 DIAGNOSIS — Z803 Family history of malignant neoplasm of breast: Secondary | ICD-10-CM | POA: Insufficient documentation

## 2023-04-30 DIAGNOSIS — Z8041 Family history of malignant neoplasm of ovary: Secondary | ICD-10-CM | POA: Insufficient documentation

## 2023-04-30 DIAGNOSIS — R21 Rash and other nonspecific skin eruption: Secondary | ICD-10-CM | POA: Insufficient documentation

## 2023-04-30 DIAGNOSIS — M25561 Pain in right knee: Secondary | ICD-10-CM | POA: Insufficient documentation

## 2023-04-30 DIAGNOSIS — C50812 Malignant neoplasm of overlapping sites of left female breast: Secondary | ICD-10-CM | POA: Insufficient documentation

## 2023-04-30 NOTE — Progress Notes (Signed)
72 year old female diagnosed with metastatic breast cancer and followed by Dr. Mosetta Putt.  She is receiving Zometa and Arimidex.  Past medical history includes CLL, hypercalcemia, hypothyroidism, and COVID.  Medications include Xanax, Prilosec, vitamin D, multivitamin, and Synthroid.  Labs include glucose 107 on July 24.  Height: 63 inches. Weight: 163 pounds 6 ounces on July 24. Patient weighed 171 pounds May 28. BMI: 28.87.  Patient reports ongoing diarrhea secondary to [employer].  Noted she has had diarrhea on and off since March 2024.  MD has recently reduced dosage with hopes diarrhea will improve.  She has been taking Imodium however she does not like to take this regularly.  Per MD note patient was instructed to take Imodium 2-3 times a day.  Patient has had a 5% weight loss in less than 3 months.  She reports increased fatigue.  Patient feels her diet is very restricted and she is not able to eat the foods her husband and son are eating.  She misses her fresh garden vegetables.  Was told not to drink milk.  States she does not know what she is supposed to eat.  Nutrition diagnosis:  Food and nutrition related knowledge deficit related to cancer and associated treatments as evidenced by no prior need for nutrition related information.  Intervention: Educated patient on importance of smaller more frequent meals and snacks throughout the day. Encouraged patient to follow a lower fiber diet.  Reviewed strategies for eating with diarrhea. Clarified patients can develop a lactose intolerance with prolonged diarrhea however, there are many low lactose and lactaid free products available today.  Lactaid milk and Fairlife milk do not contain lactose and are acceptable and allowed on diet.  She does not need to avoid milk.  (Current education allows patients up to 2 cups of regular milk daily.)  Encouraged higher protein foods. Provided nutrition facts sheets.  Questions were answered.  Contact  information given.  Monitoring, evaluation, goals: Patient will tolerate adequate calories and protein to minimize further weight loss and improve diarrhea.  Next visit: Wednesday, September 18 during infusion with Rosalita Chessman.  **Disclaimer: This note was dictated with voice recognition software. Similar sounding words can inadvertently be transcribed and this note may contain transcription errors which may not have been corrected upon publication of note.**

## 2023-05-13 ENCOUNTER — Other Ambulatory Visit: Payer: Self-pay

## 2023-05-13 MED ORDER — ABEMACICLIB 100 MG PO TABS: 100.0000 mg | ORAL_TABLET | Freq: Two times a day (BID) | ORAL | 0 refills | Status: DC

## 2023-05-14 NOTE — Progress Notes (Unsigned)
Patient Care Team: [name], MD as PCP - General (Family Medicine) Talmage Coin, MD as Attending Physician (Internal Medicine) Pershing Proud, RN as Oncology Nurse Navigator Donnelly Angelica, RN as Oncology Nurse Navigator Malachy Mood, MD as Consulting Physician (Hematology)   CHIEF COMPLAINT: Follow up metastatic breast cancer   Oncology History Overview Note   Cancer Staging  Breast cancer metastasized to axillary lymph node, left [facility]) Staging form: Breast, AJCC 8th Edition - Clinical stage from 02/01/2023: Stage IV (cT2, cN1(f), cM1, G2, ER+, PR+, HER2: Equivocal) - Signed by Malachy Mood, MD on 02/08/2023 Stage prefix: Initial diagnosis Method of lymph node assessment: Fine needle aspiration Histologic grading system: 3 grade system  CLL (chronic lymphocytic leukemia) (HCC) Staging form: Chronic Lymphocytic Leukemia / Small Lymphocytic Lymphoma, AJCC 8th Edition - Clinical stage from 08/24/2021: Modified Rai Stage I (Modified Rai risk: Intermediate, Lymphocytosis: Present, Adenopathy: Present, Organomegaly: Absent, Anemia: Absent, Thrombocytopenia: Absent) - Signed by Artis Delay, MD on 09/03/2022 Stage prefix: Initial diagnosis     CLL (chronic lymphocytic leukemia) (HCC)  06/12/2012 Initial Diagnosis   CLL (chronic lymphocytic leukemia)   07/20/2014 Pathology Results   FISH was normal   08/24/2021 Cancer Staging   Staging form: Chronic Lymphocytic Leukemia / Small Lymphocytic Lymphoma, AJCC 8th Edition - Clinical stage from 08/24/2021: Modified Rai Stage I (Modified Rai risk: Intermediate, Lymphocytosis: Present, Adenopathy: Present, Organomegaly: Absent, Anemia: Absent, Thrombocytopenia: Absent) - Signed by Artis Delay, MD on 09/03/2022 Stage prefix: Initial diagnosis   09/03/2022 Imaging   1. Isolated enlarged 16 mm short axis left axillary node. No other lymphadenopathy in the chest, abdomen, or pelvis. 2. 2.1 cm right thyroid nodule. Recommend thyroid US (ref:  J Am Coll Radiol. 2015 Feb;12(2): 143-50). 3. Tiny bilateral nonobstructing renal stones.   Breast cancer metastasized to axillary lymph node, left (HCC)  01/23/2023 Imaging   IMPRESSION: 1. 2.3 cm mass in the 3 o'clock position of the left breast with imaging features highly suspicious for primary breast cancer. 2. 3.1 cm markedly enlarged left axillary lymph node most likely representing a metastatic node associated with the patient's probable primary breast cancer. A single enlarged lymph node associated with the patient's chronic lymphocytic leukemia is less likely.   RECOMMENDATION: 1. Ultrasound-guided core needle biopsy of the 2.3 cm mass in the 3 o'clock position of the left breast. 2. Ultrasound-guided core needle biopsy of the 3.1 cm enlarged left axillary lymph node. This has been discussed with the patient and her husband and the biopsies have been scheduled at 12:45 p.m. on 01/29/2023.   01/29/2023 Pathology Results   Patient: Kaitlin Barnes Collected: 01/29/2023 Client: [facility] Accession: [id number] Received: 01/29/2023 Amie Portland, MD DOB: [birth year] Age: 72 Gender: F Reported: 01/30/2023 [address] Patient Ph: [phone] MRN #: [id number] [place], [place] [address] Client Acc#: Chart #: [id number] Phone: [phone] Fax: CC: [facility] INTERNAL CC CC: [name] REPORT OF SURGICAL PATHOLOGY Addendum: Breast Biomarker Results FINAL DIAGNOSIS Diagnosis 1. Breast, left, needle core biopsy, 3 o'clock, mass, coil clip - INVASIVE MAMMARY CARCINOMA, SEE NOTE - MAMMARY CARCINOMA IN SITU, INTERMEDIATE GRADE - TUBULE FORMATION: SCORE 3 - NUCLEAR PLEOMORPHISM: SCORE 3 - MITOTIC COUNT: SCORE 1 - TOTAL SCORE: 7 - OVERALL GRADE: 2 - LYMPHOVASCULAR INVASION: NOT IDENTIFIED - CANCER LENGTH: 1.3 CM - CALCIFICATIONS: PRESENT, RARE - OTHER FINDINGS: INTRADUCTAL PAPILLOMA WITH ASSOCIATED MICROCALCIFICATION 2. Lymph node, needle/core biopsy, axilla, lymph  node, hydromark clip - METASTATIC CARCINOMA -  GREATEST DIMENSION: 1.2 CM - NO DEFINITIVE LYMPHOID TISSUE IDENTIFIED - SEE COMMENT Diagnosis Note 1. -2. Dr. Luisa Hart reviewed the case and concurs with the interpretation. Immunohistochemical staining for E-cadherin and a breast prognostic profile (ER, PR, Ki-67 and HER2) on block 1A is pending and will be reported in an addendum. 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  1. Breast, left, needle core biopsy, 3 o'clock, mass, coil clip PROGNOSTIC INDICATORS Results: IMMUNOHISTOCHEMICAL AND MORPHOMETRIC ANALYSIS PERFORMED MANUALLY The tumor cells are EQUIVOCAL for Her2 (2+). Her2 by FISH will be performed and the results reported separately. Estrogen Receptor: 100%, POSITIVE, STRONG STAINING INTENSITY Progesterone Receptor: 20%, POSITIVE, STRONG STAINING INTENSITY Proliferation Marker Ki67: 45%    02/01/2023 Initial Diagnosis   Breast cancer metastasized to axillary lymph node, left (HCC)   02/01/2023 Cancer Staging   Staging form: Breast, AJCC 8th Edition - Clinical stage from 02/01/2023: Stage IV (cT2, cN1(f), cM1, G2, ER+, PR+, HER2: Equivocal) - Signed by Malachy Mood, MD on 02/08/2023 Stage prefix: Initial diagnosis Method of lymph node assessment: Fine needle aspiration Histologic grading system: 3 grade system   02/07/2023 PET scan   1. Hypermetabolic left breast mass consistent with known primary breast malignancy. 2. Hypermetabolic nodal metastases in the left axilla and subpectoral regions. 3. No evidence of metastatic disease within the abdomen or pelvis. 4. Several hypermetabolic osseous lesions in the thoracolumbar spine and right ribs, consistent with osseous metastatic disease. 5. Nonobstructing bilateral renal calculi.      CURRENT THERAPY:  Anastrozole po once daily starting 01/2023 Verzenio starting ~02/23/23 Zometa q3 months, starting 6/24   INTERVAL HISTORY Kaitlin Barnes returns for  follow up as scheduled. Last seen by Dr. Mosetta Putt 04/17/23, continues anastrozole, and Verzenio was dose reduced to 100 mg BID due to fatigue and diarrhea.  ROS   Past Medical History:  Diagnosis Date   CLL (chronic lymphoblastic leukemia)    CLL (chronic lymphocytic leukemia) (HCC) 06/12/2012   Family history of breast cancer    Family history of ovarian cancer    Family history of prostate cancer    Fever 03/28/2016   Hypercalcemia    Hyperparathyroidism, unspecified (HCC)    Hypothyroidism    Lung nodule 07/16/2013     Past Surgical History:  Procedure Laterality Date   BREAST BIOPSY Left    benign   BREAST BIOPSY Left 01/29/2023   times 2   BREAST BIOPSY Left 01/29/2023   [place] LT BREAST BX W LOC DEV 1ST LESION IMG BX SPEC US GUIDE 01/29/2023 [facility] MAMMOGRAP[facility]   IR FLUORO GUIDED NEEDLE PLC ASPIRATION/INJECTION LOC  02/14/2023   KNEE ARTHROSCOPY Left    THYROIDECTOMY       Outpatient Encounter Medications as of 05/15/2023  Medication Sig   abemaciclib (VERZENIO) 100 MG tablet Take 1 tablet (100 mg total) by mouth 2 (two) times daily.   ALPRAZolam (XANAX) 0.25 MG tablet Take 1 tablet (0.25 mg total) by mouth at bedtime as needed for anxiety.   anastrozole (ARIMIDEX) 1 MG tablet Take 1 tablet (1 mg total) by mouth daily.   cholecalciferol (VITAMIN D) 1000 UNITS tablet Take 2,000 Units by mouth daily.   levothyroxine (SYNTHROID) 75 MCG tablet Take 75 mcg by mouth daily.   Multiple Vitamin (MULTIVITAMIN) tablet Take 1 tablet by mouth daily.   omeprazole (PRILOSEC) 40 MG capsule Take 1 capsule (40 mg total) by mouth daily.   No facility-administered encounter medications on file as of 05/15/2023.     There were  no vitals filed for this visit. There is no height or weight on file to calculate BMI.   PHYSICAL EXAM GENERAL:alert, no distress and comfortable SKIN: no rash  EYES: sclera clear NECK: without mass LYMPH:  no palpable cervical or supraclavicular lymphadenopathy  LUNGS:  clear with normal breathing effort HEART: regular rate & rhythm, no lower extremity edema ABDOMEN: abdomen soft, non-tender and normal bowel sounds NEURO: alert & oriented x 3 with fluent speech, no focal motor/sensory deficits Breast exam:  PAC without erythema    CBC    Component Value Date/Time   WBC 57.4 (HH) 04/17/2023 1119   WBC 47.2 (H) 08/24/2022 0921   RBC 3.44 (L) 04/17/2023 1119   HGB 10.9 (L) 04/17/2023 1119   HGB 13.9 07/22/2017 0939   HCT 33.2 (L) 04/17/2023 1119   HCT 42.3 07/22/2017 0939   PLT 157 04/17/2023 1119   PLT 217 07/22/2017 0939   MCV 96.5 04/17/2023 1119   MCV 92.4 07/22/2017 0939   MCH 31.7 04/17/2023 1119   MCHC 32.8 04/17/2023 1119   RDW 16.3 (H) 04/17/2023 1119   RDW 13.5 07/22/2017 0939   LYMPHSABS 55.1 (H) 04/17/2023 1119   LYMPHSABS 31.4 (H) 07/22/2017 0939   MONOABS 0.1 04/17/2023 1119   MONOABS 0.7 07/22/2017 0939   EOSABS 0.0 04/17/2023 1119   EOSABS 0.2 07/22/2017 0939   BASOSABS 0.1 04/17/2023 1119   BASOSABS 0.1 07/22/2017 0939     CMP     Component Value Date/Time   NA 141 04/17/2023 1119   NA 143 07/19/2016 0910   K 3.9 04/17/2023 1119   K 4.6 07/19/2016 0910   CL 108 04/17/2023 1119   CL 104 12/11/2012 1408   CO2 29 04/17/2023 1119   CO2 28 07/19/2016 0910   GLUCOSE 107 (H) 04/17/2023 1119   GLUCOSE 115 07/19/2016 0910   GLUCOSE 94 12/11/2012 1408   BUN 15 04/17/2023 1119   BUN 11.5 07/19/2016 0910   CREATININE 0.87 04/17/2023 1119   CREATININE 0.8 07/19/2016 0910   CALCIUM 10.4 (H) 04/17/2023 1119   CALCIUM 10.6 (H) 07/19/2016 0910   PROT 5.7 (L) 04/17/2023 1119   PROT 6.9 07/19/2016 0910   ALBUMIN 3.9 04/17/2023 1119   ALBUMIN 3.8 07/19/2016 0910   AST 15 04/17/2023 1119   AST 28 07/19/2016 0910   ALT 14 04/17/2023 1119   ALT 52 07/19/2016 0910   ALKPHOS 65 04/17/2023 1119   ALKPHOS 114 07/19/2016 0910   BILITOT 0.4 04/17/2023 1119   BILITOT 0.32 07/19/2016 0910   GFRNONAA >60 04/17/2023 1119   GFRAA >60  07/22/2018 1111     ASSESSMENT & PLAN:Kaitlin Barnes is a 72 y.o. female with    Breast cancer metastasized to axillary lymph node, left Stage IV, ([id number]), ER+/PR+/HER2 low expression, Grade 2, with bone metastasis  -Patient presented with a palpable left breast mass and enlarged left axillary lymph nodes in [id number].  Previous mammogram 1 year ago was negative. -Breast and left axillary lymph node biopsy, both showed grade 2 invasive ductal carcinoma, ER 100% and PR 20% strongly positive, HER2 IHC 2+, FISH negative. -PET scan showed hypermetabolic left breast, left axilla and subpectoral nodes, and multiple spinal bone mets.  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  Prognostic panel showed ER+ PR - and HER2 - -baseline CA 27.29 and 15.3 both normal  -She began first line palliative anastrozole in 01/2023 and verzenio BID 02/28/23, dose reduced to 100 mg BID  on 04/17/23   Chronic lymphocytic leukemia (CLL) -WBC increased significantly from December/2023 to May/2024 up to 66 -She has been followed by Dr. Bertis Ruddy, asymptomatic from CLL, and no other cytopenias, there has been no indication for treatment -monitoring    Right knee/leg pain -Onset with breast cancer dx -Bilateral doppler 02/19/23 negative for DVT, but showed cystic structure in the right popliteal fossa strain 3.5 x 0.5 x 1.7 cm -Managing with NSAIDs as needed    PLAN:  No orders of the defined types were placed in this encounter.     All questions were answered. The patient knows to call the clinic with any problems, questions or concerns. No barriers to learning were detected. I spent *** counseling the patient face to face. The total time spent in the appointment was *** and more than 50% was on counseling, review of test results, and coordination of care.   Santiago Glad, NP-C @DATE @

## 2023-05-15 ENCOUNTER — Inpatient Hospital Stay (HOSPITAL_BASED_OUTPATIENT_CLINIC_OR_DEPARTMENT_OTHER): Payer: PPO | Admitting: Nurse Practitioner

## 2023-05-15 ENCOUNTER — Encounter: Payer: Self-pay | Admitting: Nurse Practitioner

## 2023-05-15 ENCOUNTER — Telehealth: Payer: Self-pay

## 2023-05-15 ENCOUNTER — Inpatient Hospital Stay: Payer: PPO

## 2023-05-15 VITALS — BP 142/61 | HR 71 | Temp 98.6°F | Resp 17 | Wt 160.6 lb

## 2023-05-15 DIAGNOSIS — R21 Rash and other nonspecific skin eruption: Secondary | ICD-10-CM | POA: Diagnosis not present

## 2023-05-15 DIAGNOSIS — C50812 Malignant neoplasm of overlapping sites of left female breast: Secondary | ICD-10-CM | POA: Diagnosis present

## 2023-05-15 DIAGNOSIS — C7951 Secondary malignant neoplasm of bone: Secondary | ICD-10-CM | POA: Diagnosis not present

## 2023-05-15 DIAGNOSIS — C773 Secondary and unspecified malignant neoplasm of axilla and upper limb lymph nodes: Secondary | ICD-10-CM | POA: Diagnosis not present

## 2023-05-15 DIAGNOSIS — C50912 Malignant neoplasm of unspecified site of left female breast: Secondary | ICD-10-CM

## 2023-05-15 DIAGNOSIS — Z170 Estrogen receptor positive status [ER+]: Secondary | ICD-10-CM | POA: Diagnosis not present

## 2023-05-15 DIAGNOSIS — Z8041 Family history of malignant neoplasm of ovary: Secondary | ICD-10-CM | POA: Diagnosis not present

## 2023-05-15 DIAGNOSIS — M25561 Pain in right knee: Secondary | ICD-10-CM | POA: Diagnosis not present

## 2023-05-15 DIAGNOSIS — Z803 Family history of malignant neoplasm of breast: Secondary | ICD-10-CM | POA: Diagnosis not present

## 2023-05-15 DIAGNOSIS — C9110 Chronic lymphocytic leukemia of B-cell type not having achieved remission: Secondary | ICD-10-CM | POA: Diagnosis present

## 2023-05-15 DIAGNOSIS — Z79811 Long term (current) use of aromatase inhibitors: Secondary | ICD-10-CM | POA: Diagnosis not present

## 2023-05-15 LAB — CMP (CANCER CENTER ONLY)
ALT: 16 U/L (ref 0–44)
AST: 18 U/L (ref 15–41)
Albumin: 4.1 g/dL (ref 3.5–5.0)
Alkaline Phosphatase: 59 U/L (ref 38–126)
Anion gap: 3 — ABNORMAL LOW (ref 5–15)
BUN: 16 mg/dL (ref 8–23)
CO2: 31 mmol/L (ref 22–32)
Calcium: 10.8 mg/dL — ABNORMAL HIGH (ref 8.9–10.3)
Chloride: 109 mmol/L (ref 98–111)
Creatinine: 0.81 mg/dL (ref 0.44–1.00)
GFR, Estimated: >60 mL/min (ref >60)
Glucose, Bld: 85 mg/dL (ref 70–99)
Potassium: 4.8 mmol/L (ref 3.5–5.1)
Sodium: 143 mmol/L (ref 135–145)
Total Bilirubin: 0.4 mg/dL (ref 0.3–1.2)
Total Protein: 6.3 g/dL — ABNORMAL LOW (ref 6.5–8.1)

## 2023-05-15 LAB — CBC WITH DIFFERENTIAL (CANCER CENTER ONLY)
Abs Immature Granulocytes: 0.07 10*3/uL (ref 0.00–0.07)
Basophils Absolute: 0.1 10*3/uL (ref 0.0–0.1)
Basophils Relative: 0 %
Eosinophils Absolute: 0.1 10*3/uL (ref 0.0–0.5)
Eosinophils Relative: 0 %
HCT: 33.6 % — ABNORMAL LOW (ref 36.0–46.0)
Hemoglobin: 10.9 g/dL — ABNORMAL LOW (ref 12.0–15.0)
Immature Granulocytes: 0 %
Lymphocytes Relative: 96 %
Lymphs Abs: 55 10*3/uL — ABNORMAL HIGH (ref 0.7–4.0)
MCH: 32.9 pg (ref 26.0–34.0)
MCHC: 32.4 g/dL (ref 30.0–36.0)
MCV: 101.5 fL — ABNORMAL HIGH (ref 80.0–100.0)
Monocytes Absolute: 0.2 10*3/uL (ref 0.1–1.0)
Monocytes Relative: 0 %
Neutro Abs: 2.6 10*3/uL (ref 1.7–7.7)
Neutrophils Relative %: 4 %
Platelet Count: 211 10*3/uL (ref 150–400)
RBC: 3.31 MIL/uL — ABNORMAL LOW (ref 3.87–5.11)
RDW: 17.7 % — ABNORMAL HIGH (ref 11.5–15.5)
Smear Review: NORMAL
WBC Count: 57.9 10*3/uL (ref 4.0–10.5)
nRBC: 0 % (ref 0.0–0.2)

## 2023-05-15 NOTE — Telephone Encounter (Signed)
Critical lab value reported:  WBC 57.9  Providers notified.

## 2023-05-16 ENCOUNTER — Other Ambulatory Visit (HOSPITAL_COMMUNITY): Payer: Self-pay

## 2023-05-17 ENCOUNTER — Telehealth: Payer: Self-pay | Admitting: Hematology

## 2023-05-30 ENCOUNTER — Other Ambulatory Visit: Payer: Self-pay

## 2023-05-30 MED ORDER — ONDANSETRON HCL 8 MG PO TABS: 8.0000 mg | ORAL_TABLET | Freq: Three times a day (TID) | ORAL | 1 refills | Status: AC | PRN

## 2023-06-05 ENCOUNTER — Other Ambulatory Visit: Payer: Self-pay | Admitting: Hematology

## 2023-06-10 ENCOUNTER — Other Ambulatory Visit: Payer: Self-pay

## 2023-06-11 NOTE — Assessment & Plan Note (Signed)
-  She has metastatic breast cancer to bones, confirmed by biopsy. -We discussed cancer related bone pain and risk of fracture, she is asymptomatic now. -I recommend Zometa infusion every 3 months, she started in June 2024.

## 2023-06-11 NOTE — Assessment & Plan Note (Signed)
-  stage 0, no indication for therapy -Will continue new monitoring CBC

## 2023-06-11 NOTE — Assessment & Plan Note (Signed)
Stage IV, ([id number]), ER+/PR+/HER2 low expression, Grade 2, with bone metastasis  -Patient presented with a palpable left breast mass and enlarged left axillary lymph nodes in April 2024.  Previous mammogram 1 year ago was negative. -I reviewed her breast and left axillary lymph node biopsy, both showed grade 2 invasive ductal carcinoma, ER 100% and PR 20% strongly positive, HER2 IHC 2+, FISH negative. -I personally reviewed her PET scan images with patient and her husband, it showed hypermetabolic left breast, left axilla and subpectoral nodes, and multiple spinal bone mets.  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  Prognostic panel results still pending. -Due to her metastatic disease, upfront breast surgery is not recommended.  Patient was seen by breast surgeon Dr. Donell Beers today.   -I recommend first-line systemic therapy with aromatase inhibitor and CDK 4/6 inhibitor, anastrozole and Verzenio, she started in June 2024. She has been tolerating well overall

## 2023-06-12 ENCOUNTER — Encounter: Payer: Self-pay | Admitting: Hematology

## 2023-06-12 ENCOUNTER — Inpatient Hospital Stay: Payer: PPO

## 2023-06-12 ENCOUNTER — Inpatient Hospital Stay: Payer: PPO | Attending: Hematology and Oncology

## 2023-06-12 ENCOUNTER — Other Ambulatory Visit: Payer: Self-pay

## 2023-06-12 ENCOUNTER — Inpatient Hospital Stay (HOSPITAL_BASED_OUTPATIENT_CLINIC_OR_DEPARTMENT_OTHER): Payer: PPO | Admitting: Hematology

## 2023-06-12 ENCOUNTER — Inpatient Hospital Stay: Payer: PPO | Admitting: Dietician

## 2023-06-12 VITALS — BP 160/77 | HR 66 | Temp 98.8°F | Resp 15 | Ht 62.0 in | Wt 157.8 lb

## 2023-06-12 DIAGNOSIS — C9110 Chronic lymphocytic leukemia of B-cell type not having achieved remission: Secondary | ICD-10-CM

## 2023-06-12 DIAGNOSIS — C50812 Malignant neoplasm of overlapping sites of left female breast: Secondary | ICD-10-CM | POA: Insufficient documentation

## 2023-06-12 DIAGNOSIS — C7951 Secondary malignant neoplasm of bone: Secondary | ICD-10-CM | POA: Insufficient documentation

## 2023-06-12 DIAGNOSIS — C50912 Malignant neoplasm of unspecified site of left female breast: Secondary | ICD-10-CM

## 2023-06-12 DIAGNOSIS — C773 Secondary and unspecified malignant neoplasm of axilla and upper limb lymph nodes: Secondary | ICD-10-CM | POA: Diagnosis not present

## 2023-06-12 LAB — CBC WITH DIFFERENTIAL (CANCER CENTER ONLY)
Abs Immature Granulocytes: 0.05 10*3/uL (ref 0.00–0.07)
Basophils Absolute: 0.2 10*3/uL — ABNORMAL HIGH (ref 0.0–0.1)
Basophils Relative: 0 %
Eosinophils Absolute: 0.1 10*3/uL (ref 0.0–0.5)
Eosinophils Relative: 0 %
HCT: 35.4 % — ABNORMAL LOW (ref 36.0–46.0)
Hemoglobin: 11.6 g/dL — ABNORMAL LOW (ref 12.0–15.0)
Immature Granulocytes: 0 %
Lymphocytes Relative: 94 %
Lymphs Abs: 52.4 10*3/uL — ABNORMAL HIGH (ref 0.7–4.0)
MCH: 34 pg (ref 26.0–34.0)
MCHC: 32.8 g/dL (ref 30.0–36.0)
MCV: 103.8 fL — ABNORMAL HIGH (ref 80.0–100.0)
Monocytes Absolute: 0.3 10*3/uL (ref 0.1–1.0)
Monocytes Relative: 1 %
Neutro Abs: 2.9 10*3/uL (ref 1.7–7.7)
Neutrophils Relative %: 5 %
Platelet Count: 216 10*3/uL (ref 150–400)
RBC: 3.41 MIL/uL — ABNORMAL LOW (ref 3.87–5.11)
RDW: 14.4 % (ref 11.5–15.5)
Smear Review: NORMAL
WBC Count: 56.1 10*3/uL (ref 4.0–10.5)
nRBC: 0 % (ref 0.0–0.2)

## 2023-06-12 LAB — CMP (CANCER CENTER ONLY)
ALT: 14 U/L (ref 0–44)
AST: 16 U/L (ref 15–41)
Albumin: 4.1 g/dL (ref 3.5–5.0)
Alkaline Phosphatase: 65 U/L (ref 38–126)
Anion gap: 4 — ABNORMAL LOW (ref 5–15)
BUN: 15 mg/dL (ref 8–23)
CO2: 28 mmol/L (ref 22–32)
Calcium: 10.7 mg/dL — ABNORMAL HIGH (ref 8.9–10.3)
Chloride: 108 mmol/L (ref 98–111)
Creatinine: 0.85 mg/dL (ref 0.44–1.00)
GFR, Estimated: >60 mL/min (ref >60)
Glucose, Bld: 86 mg/dL (ref 70–99)
Potassium: 4.1 mmol/L (ref 3.5–5.1)
Sodium: 140 mmol/L (ref 135–145)
Total Bilirubin: 0.4 mg/dL (ref 0.3–1.2)
Total Protein: 6.4 g/dL — ABNORMAL LOW (ref 6.5–8.1)

## 2023-06-12 MED ORDER — ZOLEDRONIC ACID 4 MG/100ML IV SOLN
4.0000 mg | Freq: Once | INTRAVENOUS | Status: AC
  Administered 2023-06-12: 4 mg via INTRAVENOUS
  Filled 2023-06-12: qty 100

## 2023-06-12 MED ORDER — SODIUM CHLORIDE 0.9 % IV SOLN: Freq: Once | INTRAVENOUS | Status: AC

## 2023-06-12 NOTE — Progress Notes (Signed)
Nutrition Follow-up:  Patient with metastatic breast cancer. She is receiving reduce dose verzenio (start 6/1) + daily anastrozole and zometa q2m  Met with pt in infusion. She reports tolerating reduced dose verzenio well. Diarrhea has resolved and now is having some constipation. Pt has continued following a low fiber diet. This helped tremendously with the diarrhea. Pt is nervous about eating foods with fiber, but misses her fruits and vegetables. She would also like to try ice cream as she has gone 3 months without eating any.   Medications: reviewed   Labs: Ca 10.7 (IV zometa today)  Anthropometrics: Wt 157 lb 12.8 oz today decreased (dietary changes per pt)  8/21 - 160 lb 9.6 oz 7/24 - 163 lb 6.4 oz  6/25 - 167 lb 6.4 oz  NUTRITION DIAGNOSIS: Food and nutrition related knowledge deficit improving    INTERVENTION:  Educated on slowly increasing dietary fiber, adding one food at a time and assess for tolerance Suggested pt try fairlife protein shake (150 kcal, 30 g). She does not like Boost/Ensure Encouraged higher calorie high protein foods to minimize further wt loss    MONITORING, EVALUATION, GOAL: wt trends, intake   NEXT VISIT: To be scheduled as needed. Pt encouraged to contact with questions/concerns

## 2023-06-12 NOTE — Patient Instructions (Signed)

## 2023-06-12 NOTE — Progress Notes (Signed)
[facility]   [phone] [phone]   Clinic Follow up Note   Patient Care Team: [name], MD as PCP - General (Family Medicine) Talmage Coin, MD as Attending Physician (Internal Medicine) Pershing Proud, RN as Oncology Nurse Navigator Donnelly Angelica, RN as Oncology Nurse Navigator Malachy Mood, MD as Consulting Physician (Hematology)  Date of Service:  06/12/2023  CHIEF COMPLAINT: f/u of metastatic breast cancer   CURRENT THERAPY:  Anastrozole po once daily starting 01/2023 Verzenio starting ~02/23/23 Zometa q3 months, starting 6/24   ASSESSMENT:  Kaitlin Barnes is a 72 y.o. female with   Breast cancer metastasized to axillary lymph node, left (HCC) Stage IV, ([id number]), ER+/PR+/HER2 low expression, Grade 2, with bone metastasis  -Patient presented with a palpable left breast mass and enlarged left axillary lymph nodes in April 2024.  Previous mammogram 1 year ago was negative. -I reviewed her breast and left axillary lymph node biopsy, both showed grade 2 invasive ductal carcinoma, ER 100% and PR 20% strongly positive, HER2 IHC 2+, FISH negative. -I personally reviewed her PET scan images with patient and her husband, it showed hypermetabolic left breast, left axilla and subpectoral nodes, and multiple spinal bone mets.  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  Prognostic panel results still pending. -Due to her metastatic disease, upfront breast surgery is not recommended.  Patient was seen by breast surgeon Dr. Donell Beers today.   -I recommend first-line systemic therapy with aromatase inhibitor and CDK 4/6 inhibitor, anastrozole and Verzenio, she started in June 2024.  Due to significant fatigue and diarrhea, Verzenio was dose reduced to 100 mg twice daily.  She has been tolerating low dose well overall   CLL (chronic lymphocytic leukemia) -stage 0, no indication for therapy -Will  continue new monitoring CBC  Metastasis to [facility]) -She has metastatic breast cancer to bones, confirmed by biopsy. -We discussed cancer related bone pain and risk of fracture, she is asymptomatic now. -I recommend Zometa infusion every 3 months, she started in June 2024.  PLAN: -I recommend  pt to get Flu and Covid vaccine -I order restaging  (PET) scan in 3 weeks -Continue Anastrozole and Verzenio 100 mg BID -proceed with Zometa today -lab and f/u on 10/16  SUMMARY OF ONCOLOGIC HISTORY: Oncology History Overview Note   Cancer Staging  Breast cancer metastasized to axillary lymph node, left (HCC) Staging form: Breast, AJCC 8th Edition - Clinical stage from 02/01/2023: Stage IV (cT2, cN1(f), cM1, G2, ER+, PR+, HER2: Equivocal) - Signed by Malachy Mood, MD on 02/08/2023 Stage prefix: Initial diagnosis Method of lymph node assessment: Fine needle aspiration Histologic grading system: 3 grade system  CLL (chronic lymphocytic leukemia) (HCC) Staging form: Chronic Lymphocytic Leukemia / Small Lymphocytic Lymphoma, AJCC 8th Edition - Clinical stage from 08/24/2021: Modified Rai Stage I (Modified Rai risk: Intermediate, Lymphocytosis: Present, Adenopathy: Present, Organomegaly: Absent, Anemia: Absent, Thrombocytopenia: Absent) - Signed by Artis Delay, MD on 09/03/2022 Stage prefix: Initial diagnosis     CLL (chronic lymphocytic leukemia) (HCC)  06/12/2012 Initial Diagnosis   CLL (chronic lymphocytic leukemia)   07/20/2014 Pathology Results   FISH was normal   08/24/2021 Cancer Staging   Staging form: Chronic Lymphocytic Leukemia / Small Lymphocytic Lymphoma, AJCC 8th Edition - Clinical stage from 08/24/2021: Modified Rai Stage I (Modified Rai risk: Intermediate, Lymphocytosis: Present, Adenopathy: Present, Organomegaly: Absent, Anemia: Absent, Thrombocytopenia: Absent) - Signed by Artis Delay, MD on 09/03/2022 Stage prefix: Initial  diagnosis   09/03/2022 Imaging   1. Isolated enlarged  16 mm short axis left axillary node. No other lymphadenopathy in the chest, abdomen, or pelvis. 2. 2.1 cm right thyroid nodule. Recommend thyroid US (ref: J Am Coll Radiol. 2015 Feb;12(2): 143-50). 3. Tiny bilateral nonobstructing renal stones.   Breast cancer metastasized to axillary lymph node, left (HCC)  01/23/2023 Imaging   IMPRESSION: 1. 2.3 cm mass in the 3 o'clock position of the left breast with imaging features highly suspicious for primary breast cancer. 2. 3.1 cm markedly enlarged left axillary lymph node most likely representing a metastatic node associated with the patient's probable primary breast cancer. A single enlarged lymph node associated with the patient's chronic lymphocytic leukemia is less likely.   RECOMMENDATION: 1. Ultrasound-guided core needle biopsy of the 2.3 cm mass in the 3 o'clock position of the left breast. 2. Ultrasound-guided core needle biopsy of the 3.1 cm enlarged left axillary lymph node. This has been discussed with the patient and her husband and the biopsies have been scheduled at 12:45 p.m. on 01/29/2023.   01/29/2023 Pathology Results   Patient: Kaitlin Barnes Collected: 01/29/2023 Client: [facility] Accession: [id number] Received: 01/29/2023 Amie Portland, MD DOB: [birth year] Age: 30 Gender: F Reported: 01/30/2023 [address] Patient Ph: [phone] MRN #: [id number] [place], [place] [address] Client Acc#: Chart #: [id number] Phone: [phone] Fax: CC: [facility] CC CC: [name] REPORT OF SURGICAL PATHOLOGY Addendum: Breast Biomarker Results FINAL DIAGNOSIS Diagnosis 1. Breast, left, needle core biopsy, 3 o'clock, mass, coil clip - INVASIVE MAMMARY CARCINOMA, SEE NOTE - MAMMARY CARCINOMA IN SITU, INTERMEDIATE GRADE - TUBULE FORMATION: SCORE 3 - NUCLEAR PLEOMORPHISM: SCORE 3 - MITOTIC COUNT: SCORE 1 - TOTAL SCORE: 7 - OVERALL GRADE: 2 - LYMPHOVASCULAR INVASION: NOT IDENTIFIED - CANCER LENGTH: 1.3 CM -  CALCIFICATIONS: PRESENT, RARE - OTHER FINDINGS: INTRADUCTAL PAPILLOMA WITH ASSOCIATED MICROCALCIFICATION 2. Lymph node, needle/core biopsy, axilla, lymph node, hydromark clip - METASTATIC CARCINOMA - GREATEST DIMENSION: 1.2 CM - NO DEFINITIVE LYMPHOID TISSUE IDENTIFIED - SEE COMMENT Diagnosis Note 1. -2. Dr. Luisa Hart reviewed the case and concurs with the interpretation. Immunohistochemical staining for E-cadherin and a breast prognostic profile (ER, PR, Ki-67 and HER2) on block 1A is pending and will be reported in an addendum. 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  1. Breast, left, needle core biopsy, 3 o'clock, mass, coil clip PROGNOSTIC INDICATORS Results: IMMUNOHISTOCHEMICAL AND MORPHOMETRIC ANALYSIS PERFORMED MANUALLY The tumor cells are EQUIVOCAL for Her2 (2+). Her2 by FISH will be performed and the results reported separately. Estrogen Receptor: 100%, POSITIVE, STRONG STAINING INTENSITY Progesterone Receptor: 20%, POSITIVE, STRONG STAINING INTENSITY Proliferation Marker Ki67: 45%    02/01/2023 Initial Diagnosis   Breast cancer metastasized to axillary lymph node, left (HCC)   02/01/2023 Cancer Staging   Staging form: Breast, AJCC 8th Edition - Clinical stage from 02/01/2023: Stage IV (cT2, cN1(f), cM1, G2, ER+, PR+, HER2: Equivocal) - Signed by Malachy Mood, MD on 02/08/2023 Stage prefix: Initial diagnosis Method of lymph node assessment: Fine needle aspiration Histologic grading system: 3 grade system   02/07/2023 PET scan   1. Hypermetabolic left breast mass consistent with known primary breast malignancy. 2. Hypermetabolic nodal metastases in the left axilla and subpectoral regions. 3. No evidence of metastatic disease within the abdomen or pelvis. 4. Several hypermetabolic osseous lesions in the thoracolumbar spine and right ribs, consistent with osseous metastatic disease. 5. Nonobstructing bilateral renal calculi.  INTERVAL  HISTORY:  Kaitlin Barnes is here for a follow up of metastatic breast cancer. She was last seen by NP Lacie on 05/15/2023. She presents to the clinic accompanied by husband. Pt state that she is doing well, but she is having some thinning of the hair. Pt report that her BM issue is a problem. She has not had diarrhea, but now she is having constipation. She cut out foods that was causing her to have diarrhea. She state that she is taking more naps than usual, but she doesn't sleep well at night.      All other systems were reviewed with the patient and are negative.  MEDICAL HISTORY:  Past Medical History:  Diagnosis Date   CLL (chronic lymphoblastic leukemia)    CLL (chronic lymphocytic leukemia) (HCC) 06/12/2012   Family history of breast cancer    Family history of ovarian cancer    Family history of prostate cancer    Fever 03/28/2016   Hypercalcemia    Hyperparathyroidism, unspecified (HCC)    Hypothyroidism    Lung nodule 07/16/2013    SURGICAL HISTORY: Past Surgical History:  Procedure Laterality Date   BREAST BIOPSY Left    benign   BREAST BIOPSY Left 01/29/2023   times 2   BREAST BIOPSY Left 01/29/2023   [place] LT BREAST BX W LOC DEV 1ST LESION IMG BX SPEC US GUIDE 01/29/2023 [facility] MAMMOGRAPHY   IR FLUORO GUIDED NEEDLE PLC ASPIRATION/INJECTION LOC  02/14/2023   KNEE ARTHROSCOPY Left    THYROIDECTOMY      I have reviewed the social history and family history with the patient and they are unchanged from previous note.  ALLERGIES:  has No Known Allergies.  MEDICATIONS:  Current Outpatient Medications  Medication Sig Dispense Refill   ALPRAZolam (XANAX) 0.25 MG tablet Take 1 tablet (0.25 mg total) by mouth at bedtime as needed for anxiety. 30 tablet 0   anastrozole (ARIMIDEX) 1 MG tablet Take 1 tablet (1 mg total) by mouth daily. 30 tablet 3   cholecalciferol (VITAMIN D) 1000 UNITS tablet Take 2,000 Units by mouth daily.     levothyroxine (SYNTHROID) 75 MCG tablet Take 75  mcg by mouth daily.     Multiple Vitamin (MULTIVITAMIN) tablet Take 1 tablet by mouth daily.     omeprazole (PRILOSEC) 40 MG capsule Take 1 capsule (40 mg total) by mouth daily. 30 capsule 2   ondansetron (ZOFRAN) 8 MG tablet Take 1 tablet (8 mg total) by mouth every 8 (eight) hours as needed for nausea or vomiting. 45 tablet 1   VERZENIO 100 MG tablet TAKE 1 TABLET BY MOUTH TWICE DAILY 56 tablet 0   No current facility-administered medications for this visit.    PHYSICAL EXAMINATION: ECOG PERFORMANCE STATUS: 1 - Symptomatic but completely ambulatory  Vitals:   06/12/23 0823  BP: (!) 160/77  Pulse: 66  Resp: 15  Temp: 98.8 F (37.1 C)  SpO2: 99%   Wt Readings from Last 3 Encounters:  06/12/23 157 lb 12.8 oz (71.6 kg)  05/15/23 160 lb 9.6 oz (72.8 kg)  04/17/23 163 lb 6.4 oz (74.1 kg)     GENERAL:alert, no distress and comfortable SKIN: skin color normal, no rashes or significant lesions EYES: normal, Conjunctiva are pink and non-injected, sclera clear  NEURO: alert & oriented x 3 with fluent speech  LABORATORY DATA:  I have reviewed the data as listed    Latest Ref Rng & Units 06/12/2023    7:59 AM 05/15/2023  7:47 AM 04/17/2023   11:19 AM  CBC  WBC 4.0 - 10.5 K/uL 56.1  57.9  57.4   Hemoglobin 12.0 - 15.0 g/dL 82.9  56.2  13.0   Hematocrit 36.0 - 46.0 % 35.4  33.6  33.2   Platelets 150 - 400 K/uL 216  211  157         Latest Ref Rng & Units 06/12/2023    7:59 AM 05/15/2023    7:47 AM 04/17/2023   11:19 AM  CMP  Glucose 70 - 99 mg/dL 86  85  865   BUN 8 - 23 mg/dL 15  16  15    Creatinine 0.44 - 1.00 mg/dL 7.84  6.96  2.95   Sodium 135 - 145 mmol/L 140  143  141   Potassium 3.5 - 5.1 mmol/L 4.1  4.8  3.9   Chloride 98 - 111 mmol/L 108  109  108   CO2 22 - 32 mmol/L 28  31  29    Calcium 8.9 - 10.3 mg/dL 28.4  13.2  44.0   Total Protein 6.5 - 8.1 g/dL 6.4  6.3  5.7   Total Bilirubin 0.3 - 1.2 mg/dL 0.4  0.4  0.4   Alkaline Phos 38 - 126 U/L 65  59  65   AST 15  - 41 U/L 16  18  15    ALT 0 - 44 U/L 14  16  14        RADIOGRAPHIC STUDIES: I have personally reviewed the radiological images as listed and agreed with the findings in the report. No results found.    Orders Placed This Encounter  Procedures   NM PET Image Restag (PS) Skull Base To Thigh    Standing Status:   Future    Standing Expiration Date:   06/11/2024    Order Specific Question:   If indicated for the ordered procedure, I authorize the administration of a radiopharmaceutical per Radiology protocol    Answer:   Yes    Order Specific Question:   Preferred imaging location?    Answer:   [facility]    Order Specific Question:   Release to patient    Answer:   Immediate   All questions were answered. The patient knows to call the clinic with any problems, questions or concerns. No barriers to learning was detected. The total time spent in the appointment was 30 minutes.     Malachy Mood, MD 06/12/2023   Carolin Coy, CMA, am acting as scribe for Malachy Mood, MD.   I have reviewed the above documentation for accuracy and completeness, and I agree with the above.

## 2023-06-12 NOTE — Progress Notes (Signed)
CRITICAL VALUE STICKER  CRITICAL VALUE: WBC 56.1  RECEIVER (on-site recipient of call): Carley Hammed DATE & TIME NOTIFIED:  06/12/2023 8:25 MESSENGER (representative from lab): Murvin Donning  MD NOTIFIED: Santiago Glad   TIME OF NOTIFICATION:830  RESPONSE: Doctor is aware

## 2023-06-17 ENCOUNTER — Other Ambulatory Visit: Payer: Self-pay | Admitting: Hematology

## 2023-06-27 ENCOUNTER — Other Ambulatory Visit: Payer: Self-pay | Admitting: Hematology

## 2023-07-03 ENCOUNTER — Other Ambulatory Visit (HOSPITAL_COMMUNITY): Payer: Self-pay

## 2023-07-04 ENCOUNTER — Ambulatory Visit (HOSPITAL_COMMUNITY)
Admission: RE | Admit: 2023-07-04 | Discharge: 2023-07-04 | Disposition: A | Discharge status: Home / Self Care | Payer: PPO | Source: Ambulatory Visit | Attending: Hematology | Admitting: Hematology

## 2023-07-04 DIAGNOSIS — C7951 Secondary malignant neoplasm of bone: Secondary | ICD-10-CM | POA: Diagnosis present

## 2023-07-04 DIAGNOSIS — C773 Secondary and unspecified malignant neoplasm of axilla and upper limb lymph nodes: Secondary | ICD-10-CM | POA: Insufficient documentation

## 2023-07-04 DIAGNOSIS — C50912 Malignant neoplasm of unspecified site of left female breast: Secondary | ICD-10-CM | POA: Diagnosis present

## 2023-07-04 LAB — GLUCOSE, CAPILLARY: Glucose-Capillary: 91 mg/dL (ref 70–99)

## 2023-07-04 MED ORDER — FLUDEOXYGLUCOSE F - 18 (FDG) INJECTION
7.8000 | Freq: Once | INTRAVENOUS | Status: AC
  Administered 2023-07-04: 7.8 via INTRAVENOUS

## 2023-07-10 ENCOUNTER — Inpatient Hospital Stay: Payer: PPO | Attending: Hematology and Oncology

## 2023-07-10 ENCOUNTER — Inpatient Hospital Stay (HOSPITAL_BASED_OUTPATIENT_CLINIC_OR_DEPARTMENT_OTHER): Payer: PPO | Admitting: Hematology

## 2023-07-10 VITALS — BP 160/72 | HR 76 | Temp 97.9°F | Resp 17 | Ht 62.0 in | Wt 158.2 lb

## 2023-07-10 DIAGNOSIS — Z803 Family history of malignant neoplasm of breast: Secondary | ICD-10-CM | POA: Insufficient documentation

## 2023-07-10 DIAGNOSIS — R197 Diarrhea, unspecified: Secondary | ICD-10-CM | POA: Insufficient documentation

## 2023-07-10 DIAGNOSIS — R5383 Other fatigue: Secondary | ICD-10-CM | POA: Insufficient documentation

## 2023-07-10 DIAGNOSIS — Z79811 Long term (current) use of aromatase inhibitors: Secondary | ICD-10-CM | POA: Insufficient documentation

## 2023-07-10 DIAGNOSIS — C773 Secondary and unspecified malignant neoplasm of axilla and upper limb lymph nodes: Secondary | ICD-10-CM | POA: Diagnosis not present

## 2023-07-10 DIAGNOSIS — C9110 Chronic lymphocytic leukemia of B-cell type not having achieved remission: Secondary | ICD-10-CM | POA: Diagnosis not present

## 2023-07-10 DIAGNOSIS — C50912 Malignant neoplasm of unspecified site of left female breast: Secondary | ICD-10-CM

## 2023-07-10 DIAGNOSIS — C50812 Malignant neoplasm of overlapping sites of left female breast: Secondary | ICD-10-CM | POA: Insufficient documentation

## 2023-07-10 DIAGNOSIS — Z79899 Other long term (current) drug therapy: Secondary | ICD-10-CM | POA: Diagnosis not present

## 2023-07-10 DIAGNOSIS — L659 Nonscarring hair loss, unspecified: Secondary | ICD-10-CM | POA: Diagnosis not present

## 2023-07-10 DIAGNOSIS — C7951 Secondary malignant neoplasm of bone: Secondary | ICD-10-CM | POA: Insufficient documentation

## 2023-07-10 DIAGNOSIS — Z170 Estrogen receptor positive status [ER+]: Secondary | ICD-10-CM | POA: Insufficient documentation

## 2023-07-10 DIAGNOSIS — Z8041 Family history of malignant neoplasm of ovary: Secondary | ICD-10-CM | POA: Diagnosis not present

## 2023-07-10 LAB — CMP (CANCER CENTER ONLY)
ALT: 18 U/L (ref 0–44)
AST: 17 U/L (ref 15–41)
Albumin: 4.2 g/dL (ref 3.5–5.0)
Alkaline Phosphatase: 66 U/L (ref 38–126)
Anion gap: 8 (ref 5–15)
BUN: 17 mg/dL (ref 8–23)
CO2: 27 mmol/L (ref 22–32)
Calcium: 10.3 mg/dL (ref 8.9–10.3)
Chloride: 104 mmol/L (ref 98–111)
Creatinine: 0.88 mg/dL (ref 0.44–1.00)
GFR, Estimated: >60 mL/min (ref >60)
Glucose, Bld: 91 mg/dL (ref 70–99)
Potassium: 4.1 mmol/L (ref 3.5–5.1)
Sodium: 139 mmol/L (ref 135–145)
Total Bilirubin: 0.3 mg/dL (ref 0.3–1.2)
Total Protein: 6.4 g/dL — ABNORMAL LOW (ref 6.5–8.1)

## 2023-07-10 LAB — CBC WITH DIFFERENTIAL (CANCER CENTER ONLY)
Abs Immature Granulocytes: 0.04 10*3/uL (ref 0.00–0.07)
Basophils Absolute: 0.1 10*3/uL (ref 0.0–0.1)
Basophils Relative: 0 %
Eosinophils Absolute: 0.1 10*3/uL (ref 0.0–0.5)
Eosinophils Relative: 0 %
HCT: 35.3 % — ABNORMAL LOW (ref 36.0–46.0)
Hemoglobin: 12 g/dL (ref 12.0–15.0)
Immature Granulocytes: 0 %
Lymphocytes Relative: 92 %
Lymphs Abs: 42 10*3/uL — ABNORMAL HIGH (ref 0.7–4.0)
MCH: 34.9 pg — ABNORMAL HIGH (ref 26.0–34.0)
MCHC: 34 g/dL (ref 30.0–36.0)
MCV: 102.6 fL — ABNORMAL HIGH (ref 80.0–100.0)
Monocytes Absolute: 0.3 10*3/uL (ref 0.1–1.0)
Monocytes Relative: 1 %
Neutro Abs: 3.3 10*3/uL (ref 1.7–7.7)
Neutrophils Relative %: 7 %
Platelet Count: 209 10*3/uL (ref 150–400)
RBC: 3.44 MIL/uL — ABNORMAL LOW (ref 3.87–5.11)
RDW: 12.8 % (ref 11.5–15.5)
Smear Review: NORMAL
WBC Count: 45.7 10*3/uL — ABNORMAL HIGH (ref 4.0–10.5)
nRBC: 0 % (ref 0.0–0.2)

## 2023-07-10 MED ORDER — ABEMACICLIB 50 MG PO TABS: 50.0000 mg | ORAL_TABLET | Freq: Two times a day (BID) | ORAL | 0 refills | Status: DC

## 2023-07-10 NOTE — Assessment & Plan Note (Signed)
Stage IV, ([id number]), ER+/PR+/HER2 low expression, Grade 2, with bone metastasis  -Patient presented with a palpable left breast mass and enlarged left axillary lymph nodes in April 2024.  Previous mammogram 1 year ago was negative. -I reviewed her breast and left axillary lymph node biopsy, both showed grade 2 invasive ductal carcinoma, ER 100% and PR 20% strongly positive, HER2 IHC 2+, FISH negative. -I personally reviewed her PET scan images with patient and her husband, it showed hypermetabolic left breast, left axilla and subpectoral nodes, and multiple spinal bone mets.  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  Prognostic panel results still pending. -Due to her metastatic disease, upfront breast surgery is not recommended.  Patient was seen by breast surgeon Dr. Donell Beers  -I recommend first-line systemic therapy with aromatase inhibitor and CDK 4/6 inhibitor, anastrozole and Verzenio, she started in June 2024.  Due to significant fatigue and diarrhea, Verzenio was dose reduced to 100 mg twice daily.  She has been tolerating low dose well overall

## 2023-07-10 NOTE — Progress Notes (Signed)
[facility]   Telephone:([phone] Fax:([phone]) [phone]   Clinic Follow up Note   Patient Care Team: [name], MD as PCP - General (Family Medicine) Talmage Coin, MD as Attending Physician (Internal Medicine) Pershing Proud, RN as Oncology Nurse Navigator Donnelly Angelica, RN as Oncology Nurse Navigator Malachy Mood, MD as Consulting Physician (Hematology)  Date of Service:  07/10/2023  CHIEF COMPLAINT: f/u of  metastatic left breast cancer  CURRENT THERAPY:  Anastrozole and Verzenio  Oncology History   Breast cancer metastasized to axillary lymph node, left (HCC) Stage IV, (IE3P2R5), ER+/PR+/HER2 low expression, Grade 2, with bone metastasis  -Patient presented with a palpable left breast mass and enlarged left axillary lymph nodes in April 2024.  Previous mammogram 1 year ago was negative. -I reviewed her breast and left axillary lymph node biopsy, both showed grade 2 invasive ductal carcinoma, ER 100% and PR 20% strongly positive, HER2 IHC 2+, FISH negative. -I personally reviewed her PET scan images with patient and her husband, it showed hypermetabolic left breast, left axilla and subpectoral nodes, and multiple spinal bone mets.  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  Prognostic panel results still pending. -Due to her metastatic disease, upfront breast surgery is not recommended.  Patient was seen by breast surgeon Dr. Donell Beers  -I recommend first-line systemic therapy with aromatase inhibitor and CDK 4/6 inhibitor, anastrozole and Verzenio, she started in June 2024.  Due to significant fatigue and diarrhea, Verzenio was dose reduced to 100 mg twice daily.  She has been tolerating low dose better, due to her persistent diarrhea which she had even before Verzenio, Verzenio dose was further reduced to 50 mg twice daily on 07/10/23    Assessment and Plan    Metastatic Breast Cancer Patient  experiencing fatigue, hair thinning, and diarrhea, likely secondary to Verzenio (abemaciclib) and anastrozole. PET scan shows improvement in size of lymph nodes and breast mass.  She has had a good partial response -Reduce Verzenio dose to 50mg  bid for 2-3 weeks to assess impact on side effects. -Consider alternating 50mg  and 100mg  doses after initial 2-3 week period if no significant improvement in side effects. -Continue anastrozole. -Patient and her husband had many questions about her prognosis, and treatment options.  I answered all to their satisfaction  Chronic diarrhea Patient reports fluctuating diarrhea, possibly related to diet and medication side effects. History of diarrhea predates cancer diagnosis and treatment.  She was previously evaluated by Dr. Ewing Schlein, and received a course of antibiotics which slightly improved her diarrhea. -Continue use of Imodium as needed.  She now has intermittent constipation and diarrhea after eating -Consider use of stool softeners instead of Miralax. -Refer to GI specialist (Dr. Ewing Schlein) for further evaluation and management.  Fatigue Patient reports significant fatigue, estimated at 60% of normal energy level prior to cancer diagnosis. -Reduce Verzenio dose to 50mg  for 2-3 weeks to assess impact on fatigue. -Consider alternating 50mg  and 100mg  doses after initial 2-3 week period if no significant improvement in fatigue.  Bone mets --Next Zometa infusion due in December. -Continue regular dental visits every 6 months. -Continue calcium and vitamin D supplementation for bone health.   Chronic Lymphocytic Leukemia (CLL) White blood cell count elevated at 45,000, likely secondary to CLL. -No indication for treatment for now  Plan -Due to her fatigue and diarrhea, I will reduce Verzenio to 50 mg twice daily, I called the prescription to her pharmacy today.  If  no significant change in 2 to 3 weeks, okay to change to 50 mg in the morning and 100 mg in  the evening -Lab, follow-up, and the Zometa infusion in 2 months       SUMMARY OF ONCOLOGIC HISTORY: Oncology History Overview Note   Cancer Staging  Breast cancer metastasized to axillary lymph node, left (HCC) Staging form: Breast, AJCC 8th Edition - Clinical stage from 02/01/2023: Stage IV (cT2, cN1(f), cM1, G2, ER+, PR+, HER2: Equivocal) - Signed by Malachy Mood, MD on 02/08/2023 Stage prefix: Initial diagnosis Method of lymph node assessment: Fine needle aspiration Histologic grading system: 3 grade system  CLL (chronic lymphocytic leukemia) (HCC) Staging form: Chronic Lymphocytic Leukemia / Small Lymphocytic Lymphoma, AJCC 8th Edition - Clinical stage from 08/24/2021: Modified Rai Stage I (Modified Rai risk: Intermediate, Lymphocytosis: Present, Adenopathy: Present, Organomegaly: Absent, Anemia: Absent, Thrombocytopenia: Absent) - Signed by Artis Delay, MD on 09/03/2022 Stage prefix: Initial diagnosis     CLL (chronic lymphocytic leukemia) (HCC)  06/12/2012 Initial Diagnosis   CLL (chronic lymphocytic leukemia)   07/20/2014 Pathology Results   FISH was normal   08/24/2021 Cancer Staging   Staging form: Chronic Lymphocytic Leukemia / Small Lymphocytic Lymphoma, AJCC 8th Edition - Clinical stage from 08/24/2021: Modified Rai Stage I (Modified Rai risk: Intermediate, Lymphocytosis: Present, Adenopathy: Present, Organomegaly: Absent, Anemia: Absent, Thrombocytopenia: Absent) - Signed by Artis Delay, MD on 09/03/2022 Stage prefix: Initial diagnosis   09/03/2022 Imaging   1. Isolated enlarged 16 mm short axis left axillary node. No other lymphadenopathy in the chest, abdomen, or pelvis. 2. 2.1 cm right thyroid nodule. Recommend thyroid US (ref: J Am Coll Radiol. 2015 Feb;12(2): 143-50). 3. Tiny bilateral nonobstructing renal stones.   Breast cancer metastasized to axillary lymph node, left (HCC)  01/23/2023 Imaging   IMPRESSION: 1. 2.3 cm mass in the 3 o'clock position of the left  breast with imaging features highly suspicious for primary breast cancer. 2. 3.1 cm markedly enlarged left axillary lymph node most likely representing a metastatic node associated with the patient's probable primary breast cancer. A single enlarged lymph node associated with the patient's chronic lymphocytic leukemia is less likely.   RECOMMENDATION: 1. Ultrasound-guided core needle biopsy of the 2.3 cm mass in the 3 o'clock position of the left breast. 2. Ultrasound-guided core needle biopsy of the 3.1 cm enlarged left axillary lymph node. This has been discussed with the patient and her husband and the biopsies have been scheduled at 12:45 p.m. on 01/29/2023.   01/29/2023 Pathology Results   Patient: [name] Collected: 01/29/2023 Client: [facility] Accession: [id number] Received: 01/29/2023 Amie Portland, MD DOB: April 12, 1951 Age: 20 Gender: F Reported: 01/30/2023 [address] Patient Ph: [phone] MRN #: [id number] [place], [place] [address] Client Acc#: Chart #: [id number] Phone: [phone] Fax: CC: [facility] CC CC: [name] REPORT OF SURGICAL PATHOLOGY Addendum: Breast Biomarker Results FINAL DIAGNOSIS Diagnosis 1. Breast, left, needle core biopsy, 3 o'clock, mass, coil clip - INVASIVE MAMMARY CARCINOMA, SEE NOTE - MAMMARY CARCINOMA IN SITU, INTERMEDIATE GRADE - TUBULE FORMATION: SCORE 3 - NUCLEAR PLEOMORPHISM: SCORE 3 - MITOTIC COUNT: SCORE 1 - TOTAL SCORE: 7 - OVERALL GRADE: 2 - LYMPHOVASCULAR INVASION: NOT IDENTIFIED - CANCER LENGTH: 1.3 CM - CALCIFICATIONS: PRESENT, RARE - OTHER FINDINGS: INTRADUCTAL PAPILLOMA WITH ASSOCIATED MICROCALCIFICATION 2. Lymph node, needle/core biopsy, axilla, lymph node, hydromark clip - METASTATIC CARCINOMA - GREATEST DIMENSION: 1.2 CM - NO DEFINITIVE LYMPHOID TISSUE IDENTIFIED - SEE COMMENT Diagnosis Note  1. -2. Dr. Luisa Hart reviewed the case and concurs with the interpretation. Immunohistochemical  staining for E-cadherin and a breast prognostic profile (ER, PR, Ki-67 and HER2) on block 1A is pending and will be reported in an addendum. 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  1. Breast, left, needle core biopsy, 3 o'clock, mass, coil clip PROGNOSTIC INDICATORS Results: IMMUNOHISTOCHEMICAL AND MORPHOMETRIC ANALYSIS PERFORMED MANUALLY The tumor cells are EQUIVOCAL for Her2 (2+). Her2 by FISH will be performed and the results reported separately. Estrogen Receptor: 100%, POSITIVE, STRONG STAINING INTENSITY Progesterone Receptor: 20%, POSITIVE, STRONG STAINING INTENSITY Proliferation Marker Ki67: 45%    02/01/2023 Initial Diagnosis   Breast cancer metastasized to axillary lymph node, left (HCC)   02/01/2023 Cancer Staging   Staging form: Breast, AJCC 8th Edition - Clinical stage from 02/01/2023: Stage IV (cT2, cN1(f), cM1, G2, ER+, PR+, HER2: Equivocal) - Signed by Malachy Mood, MD on 02/08/2023 Stage prefix: Initial diagnosis Method of lymph node assessment: Fine needle aspiration Histologic grading system: 3 grade system   02/07/2023 PET scan   1. Hypermetabolic left breast mass consistent with known primary breast malignancy. 2. Hypermetabolic nodal metastases in the left axilla and subpectoral regions. 3. No evidence of metastatic disease within the abdomen or pelvis. 4. Several hypermetabolic osseous lesions in the thoracolumbar spine and right ribs, consistent with osseous metastatic disease. 5. Nonobstructing bilateral renal calculi.      Discussed the use of AI scribe software for clinical note transcription with the patient, who gave verbal consent to proceed.  History of Present Illness   A 72 year old individual with a history of metastatic breast cancer presents for a follow-up visit. She reports experiencing fatigue, hair thinning, and diarrhea, which she attributes to her current medication regimen of Verzenio and anastrozole. The  fatigue is significant, with the individual noting that she feels "like I can't take another step" after short distances. Despite this, she continues to perform daily activities such as grocery shopping and cooking, albeit with frequent naps. She estimates her energy level to be around 60-70% of her pre-cancer diagnosis level.  The individual's diarrhea is described as "up and down," with frequency and severity varying. She reports having two episodes of diarrhea today, but also mentions instances of incontinence in the past week. She believes her diet significantly impacts her symptoms and expresses a desire to return to a more bland diet. She has tried using Imodium and lactase pills to manage her symptoms, with some success.  The individual also reports hair thinning, which she believes is due to the anastrozole. She expresses surprise at this side effect, as she was not expecting to lose as much hair. She also mentions experiencing hot flashes at night, which have improved slightly with the use of a ceiling fan.  Prior to starting Verzenio, the individual had been experiencing diarrhea for several months. She sought medical attention for this, suspecting a parasitic infection. She was treated with a strong antibiotic, which improved her symptoms somewhat, but the diarrhea never fully resolved.         All other systems were reviewed with the patient and are negative.  MEDICAL HISTORY:  Past Medical History:  Diagnosis Date   CLL (chronic lymphoblastic leukemia)    CLL (chronic lymphocytic leukemia) (HCC) 06/12/2012   Family history of breast cancer    Family history of ovarian cancer    Family history of prostate cancer    Fever 03/28/2016   Hypercalcemia    Hyperparathyroidism, unspecified (HCC)  Hypothyroidism    Lung nodule 07/16/2013    SURGICAL HISTORY: Past Surgical History:  Procedure Laterality Date   BREAST BIOPSY Left    benign   BREAST BIOPSY Left 01/29/2023   times  2   BREAST BIOPSY Left 01/29/2023   [place] LT BREAST BX W LOC DEV 1ST LESION IMG BX SPEC US GUIDE 01/29/2023 [facility]   IR FLUORO GUIDED NEEDLE PLC ASPIRATION/INJECTION LOC  02/14/2023   KNEE ARTHROSCOPY Left    THYROIDECTOMY      I have reviewed the social history and family history with the patient and they are unchanged from previous note.  ALLERGIES:  has No Known Allergies.  MEDICATIONS:  Current Outpatient Medications  Medication Sig Dispense Refill   abemaciclib (VERZENIO) 50 MG tablet Take 1 tablet (50 mg total) by mouth 2 (two) times daily. 56 tablet 0   ALPRAZolam (XANAX) 0.25 MG tablet Take 1 tablet (0.25 mg total) by mouth at bedtime as needed for anxiety. 30 tablet 0   anastrozole (ARIMIDEX) 1 MG tablet Take 1 tablet by mouth once daily 30 tablet 0   cholecalciferol (VITAMIN D) 1000 UNITS tablet Take 2,000 Units by mouth daily.     levothyroxine (SYNTHROID) 75 MCG tablet Take 75 mcg by mouth daily.     Multiple Vitamin (MULTIVITAMIN) tablet Take 1 tablet by mouth daily.     omeprazole (PRILOSEC) 40 MG capsule Take 1 capsule (40 mg total) by mouth daily. 30 capsule 2   ondansetron (ZOFRAN) 8 MG tablet Take 1 tablet (8 mg total) by mouth every 8 (eight) hours as needed for nausea or vomiting. 45 tablet 1   No current facility-administered medications for this visit.    PHYSICAL EXAMINATION: ECOG PERFORMANCE STATUS: 2 - Symptomatic, <50% confined to bed  Vitals:   07/10/23 1001  BP: (!) 160/72  Pulse: 76  Resp: 17  Temp: 97.9 F (36.6 C)  SpO2: 100%   Wt Readings from Last 3 Encounters:  07/10/23 158 lb 3.2 oz (71.8 kg)  06/12/23 157 lb 12.8 oz (71.6 kg)  05/15/23 160 lb 9.6 oz (72.8 kg)     GENERAL:alert, no distress and comfortable SKIN: skin color, texture, turgor are normal, no rashes or significant lesions EYES: normal, Conjunctiva are pink and non-injected, sclera clear NECK: supple, thyroid normal size, non-tender, without nodularity LYMPH:  no  palpable lymphadenopathy in the cervical, axillary  LUNGS: clear to auscultation and percussion with normal breathing effort HEART: regular rate & rhythm and no murmurs and no lower extremity edema ABDOMEN:abdomen soft, non-tender and normal bowel sounds Musculoskeletal:no cyanosis of digits and no clubbing  NEURO: alert & oriented x 3 with fluent speech, no focal motor/sensory deficits Breasts: Breast inspection showed them to be symmetrical with no nipple discharge. Palpation of the breasts and axilla revealed no obvious mass that I could appreciate.   LABORATORY DATA:  I have reviewed the data as listed    Latest Ref Rng & Units 07/10/2023    9:48 AM 06/12/2023    7:59 AM 05/15/2023    7:47 AM  CBC  WBC 4.0 - 10.5 K/uL 45.7  56.1  57.9   Hemoglobin 12.0 - 15.0 g/dL 40.1  02.7  25.3   Hematocrit 36.0 - 46.0 % 35.3  35.4  33.6   Platelets 150 - 400 K/uL 209  216  211         Latest Ref Rng & Units 07/10/2023    9:48 AM 06/12/2023    7:59 AM  05/15/2023    7:47 AM  CMP  Glucose 70 - 99 mg/dL 91  86  85   BUN 8 - 23 mg/dL 17  15  16    Creatinine 0.44 - 1.00 mg/dL 3.29  5.18  8.41   Sodium 135 - 145 mmol/L 139  140  143   Potassium 3.5 - 5.1 mmol/L 4.1  4.1  4.8   Chloride 98 - 111 mmol/L 104  108  109   CO2 22 - 32 mmol/L 27  28  31    Calcium 8.9 - 10.3 mg/dL 66.0  63.0  16.0   Total Protein 6.5 - 8.1 g/dL 6.4  6.4  6.3   Total Bilirubin 0.3 - 1.2 mg/dL 0.3  0.4  0.4   Alkaline Phos 38 - 126 U/L 66  65  59   AST 15 - 41 U/L 17  16  18    ALT 0 - 44 U/L 18  14  16        RADIOGRAPHIC STUDIES: I have personally reviewed the radiological images as listed and agreed with the findings in the report. No results found.    No orders of the defined types were placed in this encounter.  All questions were answered. The patient knows to call the clinic with any problems, questions or concerns. No barriers to learning was detected. The total time spent in the appointment was 40  minutes.     Malachy Mood, MD 07/10/2023

## 2023-07-13 ENCOUNTER — Encounter: Payer: Self-pay | Admitting: Hematology

## 2023-07-20 ENCOUNTER — Other Ambulatory Visit: Payer: Self-pay | Admitting: Hematology

## 2023-07-25 ENCOUNTER — Telehealth: Payer: Self-pay | Admitting: Pharmacy Technician

## 2023-07-25 NOTE — Telephone Encounter (Signed)
Oral Oncology Patient Advocate Encounter   Received notification that patient is due for re-enrollment for assistance for Verzenio through [employer].   Re-enrollment process has been initiated and will be submitted upon completion of necessary documents.  Patient agreed to sign in person 08/19/23 at the office.  [employer] number [phone].   I will continue to follow until final determination.  Jinger Neighbors, CPhT-Adv Oncology Pharmacy Patient Advocate [facility] Direct Number: [phone]  Fax: [phone]

## 2023-08-08 ENCOUNTER — Other Ambulatory Visit: Payer: Self-pay | Admitting: Hematology

## 2023-08-08 ENCOUNTER — Other Ambulatory Visit: Payer: Self-pay

## 2023-08-15 ENCOUNTER — Other Ambulatory Visit (HOSPITAL_COMMUNITY): Payer: Self-pay

## 2023-08-15 ENCOUNTER — Other Ambulatory Visit: Payer: Self-pay

## 2023-08-17 ENCOUNTER — Other Ambulatory Visit: Payer: Self-pay | Admitting: Hematology

## 2023-08-18 ENCOUNTER — Encounter: Payer: Self-pay | Admitting: Hematology

## 2023-08-19 NOTE — Telephone Encounter (Signed)
Oral Oncology Patient Advocate Encounter   Submitted application for assistance for Verzenio to [employer].   Application submitted via e-fax to [phone]   [employer] phone number [phone].   I will continue to check the status until final determination.   Darey Hershberger, CPhT-Adv Oncology Pharmacy Patient Advocate [facility] Direct Number: [phone]  Fax: [phone]   

## 2023-08-20 NOTE — Telephone Encounter (Signed)
Oral Oncology Patient Advocate Encounter   Received notification re-enrollment for assistance for Verzenio through [employer] has been approved. Patient may continue to receive their medication at $0 from this program.    [employer] phone number [phone].   Effective dates: 09/25/23 through 09/23/24  I have spoken to the patient.  Jinger Neighbors, CPhT-Adv Oncology Pharmacy Patient Advocate [facility] Direct Number: [phone]  Fax: [phone]

## 2023-09-05 ENCOUNTER — Other Ambulatory Visit: Payer: Self-pay

## 2023-09-05 MED ORDER — VERZENIO 50 MG PO TABS: 50.0000 mg | ORAL_TABLET | Freq: Two times a day (BID) | ORAL | 0 refills | Status: DC

## 2023-09-06 ENCOUNTER — Other Ambulatory Visit: Payer: Self-pay

## 2023-09-06 ENCOUNTER — Other Ambulatory Visit: Payer: Self-pay | Admitting: Hematology

## 2023-09-08 NOTE — Progress Notes (Unsigned)
Patient Care Team: [name], MD as PCP - General (Family Medicine) Talmage Coin, MD as Attending Physician (Internal Medicine) Pershing Proud, RN as Oncology Nurse Navigator Donnelly Angelica, RN as Oncology Nurse Navigator Malachy Mood, MD as Consulting Physician (Hematology)   CHIEF COMPLAINT: Follow up metastatic breast cancer   Oncology History Overview Note   Cancer Staging  Breast cancer metastasized to axillary lymph node, left [facility]) Staging form: Breast, AJCC 8th Edition - Clinical stage from 02/01/2023: Stage IV (cT2, cN1(f), cM1, G2, ER+, PR+, HER2: Equivocal) - Signed by Malachy Mood, MD on 02/08/2023 Stage prefix: Initial diagnosis Method of lymph node assessment: Fine needle aspiration Histologic grading system: 3 grade system  CLL (chronic lymphocytic leukemia) (HCC) Staging form: Chronic Lymphocytic Leukemia / Small Lymphocytic Lymphoma, AJCC 8th Edition - Clinical stage from 08/24/2021: Modified Rai Stage I (Modified Rai risk: Intermediate, Lymphocytosis: Present, Adenopathy: Present, Organomegaly: Absent, Anemia: Absent, Thrombocytopenia: Absent) - Signed by Artis Delay, MD on 09/03/2022 Stage prefix: Initial diagnosis     CLL (chronic lymphocytic leukemia) (HCC)  06/12/2012 Initial Diagnosis   CLL (chronic lymphocytic leukemia)   07/20/2014 Pathology Results   FISH was normal   08/24/2021 Cancer Staging   Staging form: Chronic Lymphocytic Leukemia / Small Lymphocytic Lymphoma, AJCC 8th Edition - Clinical stage from 08/24/2021: Modified Rai Stage I (Modified Rai risk: Intermediate, Lymphocytosis: Present, Adenopathy: Present, Organomegaly: Absent, Anemia: Absent, Thrombocytopenia: Absent) - Signed by Artis Delay, MD on 09/03/2022 Stage prefix: Initial diagnosis   09/03/2022 Imaging   1. Isolated enlarged 16 mm short axis left axillary node. No other lymphadenopathy in the chest, abdomen, or pelvis. 2. 2.1 cm right thyroid nodule. Recommend thyroid US (ref:  J Am Coll Radiol. 2015 Feb;12(2): 143-50). 3. Tiny bilateral nonobstructing renal stones.   03/18/2023 Genetic Testing   Negative genetic testing on the CancerNext-Expanded panel test.  The report date is March 18, 2023.  The CancerNext-Expanded gene panel offered by [employer] and includes sequencing and rearrangement analysis for the following 71 genes: AIP, ALK, APC, ATM, BAP1, BARD1, BMPR1A, BRCA1, BRCA2, BRIP1, CDC73, CDH1, CDK4, CDKN1B, CDKN2A, CHEK2, DICER1, FH, FLCN, KIF1B, LZTR1, MAX, MEN1, MET, MLH1, MSH2, MSH6, MUTYH, NF1, NF2, NTHL1, PALB2, PHOX2B, PMS2, POT1, PRKAR1A, PTCH1, PTEN, RAD51C, RAD51D, RB1, RET, SDHA, SDHAF2, SDHB, SDHC, SDHD, SMAD4, SMARCA4, SMARCB1, SMARCE1, STK11, SUFU, TMEM127, TP53, TSC1, TSC2 and VHL (sequencing and deletion/duplication); AXIN2, CTNNA1, EGFR, EGLN1, HOXB13, KIT, MITF, MSH3, PDGFRA, POLD1 and POLE (sequencing only); EPCAM and GREM1 (deletion/duplication only). RNA data is routinely analyzed for use in variant interpretation for all genes.    Breast cancer metastasized to axillary lymph node, left (HCC)  01/23/2023 Imaging   IMPRESSION: 1. 2.3 cm mass in the 3 o'clock position of the left breast with imaging features highly suspicious for primary breast cancer. 2. 3.1 cm markedly enlarged left axillary lymph node most likely representing a metastatic node associated with the patient's probable primary breast cancer. A single enlarged lymph node associated with the patient's chronic lymphocytic leukemia is less likely.   RECOMMENDATION: 1. Ultrasound-guided core needle biopsy of the 2.3 cm mass in the 3 o'clock position of the left breast. 2. Ultrasound-guided core needle biopsy of the 3.1 cm enlarged left axillary lymph node. This has been discussed with the patient and her husband and the biopsies have been scheduled at 12:45 p.m. on 01/29/2023.   01/29/2023 Pathology Results   Patient: Kaitlin Barnes, Kaitlin Barnes Collected: 01/29/2023 Client: [facility]  Accession: [id number] Received: 01/29/2023 Amie Portland, MD DOB: 01-21-1951 Age: 72 Gender: F Reported: 01/30/2023 [address] Patient Ph: [phone] MRN #: [id number] [place], [place] [address] Client Acc#: Chart #: [id number] Phone: [phone] Fax: CC: [facility] INTER[facility] CC CC: [name] REPORT OF SURGICAL PATHOLOGY Addendum: Breast Biomarker Results FINAL DIAGNOSIS Diagnosis 1. Breast, left, needle core biopsy, 3 o'clock, mass, coil clip - INVASIVE MAMMARY CARCINOMA, SEE NOTE - MAMMARY CARCINOMA IN SITU, INTERMEDIATE GRADE - TUBULE FORMATION: SCORE 3 - NUCLEAR PLEOMORPHISM: SCORE 3 - MITOTIC COUNT: SCORE 1 - TOTAL SCORE: 7 - OVERALL GRADE: 2 - LYMPHOVASCULAR INVASION: NOT IDENTIFIED - CANCER LENGTH: 1.3 CM - CALCIFICATIONS: PRESENT, RARE - OTHER FINDINGS: INTRADUCTAL PAPILLOMA WITH ASSOCIATED MICROCALCIFICATION 2. Lymph node, needle/core biopsy, axilla, lymph node, hydromark clip - METASTATIC CARCINOMA - GREATEST DIMENSION: 1.2 CM - NO DEFINITIVE LYMPHOID TISSUE IDENTIFIED - SEE COMMENT Diagnosis Note 1. -2. Dr. Luisa Hart reviewed the case and concurs with the interpretation. Immunohistochemical staining for E-cadherin and a breast prognostic profile (ER, PR, Ki-67 and HER2) on block 1A is pending and will be reported in an addendum. 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  1. Breast, left, needle core biopsy, 3 o'clock, mass, coil clip PROGNOSTIC INDICATORS Results: IMMUNOHISTOCHEMICAL AND MORPHOMETRIC ANALYSIS PERFORMED MANUALLY The tumor cells are EQUIVOCAL for Her2 (2+). Her2 by FISH will be performed and the results reported separately. Estrogen Receptor: 100%, POSITIVE, STRONG STAINING INTENSITY Progesterone Receptor: 20%, POSITIVE, STRONG STAINING INTENSITY Proliferation Marker Ki67: 45%    02/01/2023 Initial Diagnosis   Breast cancer metastasized to axillary lymph node, left (HCC)   02/01/2023 Cancer  Staging   Staging form: Breast, AJCC 8th Edition - Clinical stage from 02/01/2023: Stage IV (cT2, cN1(f), cM1, G2, ER+, PR+, HER2: Equivocal) - Signed by Malachy Mood, MD on 02/08/2023 Stage prefix: Initial diagnosis Method of lymph node assessment: Fine needle aspiration Histologic grading system: 3 grade system   02/07/2023 PET scan   1. Hypermetabolic left breast mass consistent with known primary breast malignancy. 2. Hypermetabolic nodal metastases in the left axilla and subpectoral regions. 3. No evidence of metastatic disease within the abdomen or pelvis. 4. Several hypermetabolic osseous lesions in the thoracolumbar spine and right ribs, consistent with osseous metastatic disease. 5. Nonobstructing bilateral renal calculi.   03/18/2023 Genetic Testing   Negative genetic testing on the CancerNext-Expanded panel test.  The report date is March 18, 2023.  The CancerNext-Expanded gene panel offered by [employer] and includes sequencing and rearrangement analysis for the following 71 genes: AIP, ALK, APC, ATM, BAP1, BARD1, BMPR1A, BRCA1, BRCA2, BRIP1, CDC73, CDH1, CDK4, CDKN1B, CDKN2A, CHEK2, DICER1, FH, FLCN, KIF1B, LZTR1, MAX, MEN1, MET, MLH1, MSH2, MSH6, MUTYH, NF1, NF2, NTHL1, PALB2, PHOX2B, PMS2, POT1, PRKAR1A, PTCH1, PTEN, RAD51C, RAD51D, RB1, RET, SDHA, SDHAF2, SDHB, SDHC, SDHD, SMAD4, SMARCA4, SMARCB1, SMARCE1, STK11, SUFU, TMEM127, TP53, TSC1, TSC2 and VHL (sequencing and deletion/duplication); AXIN2, CTNNA1, EGFR, EGLN1, HOXB13, KIT, MITF, MSH3, PDGFRA, POLD1 and POLE (sequencing only); EPCAM and GREM1 (deletion/duplication only). RNA data is routinely analyzed for use in variant interpretation for all genes.       CURRENT THERAPY:  Anastrozole po once daily starting 01/2023 Verzenio starting ~02/23/23, did not tolerate 150 mg BID or 100mg AM/150 mg PM; tolerating 50 mg BID since 06/2023 Zometa q3 months, starting 6/24   INTERVAL HISTORY Ms. Water returns for follow up as scheduled.   Doing well overall.  Tolerating Verzenio 50 mg twice daily better.  Still has fatigue, but stable, and  remains functional and out of bed.  Diarrhea subsided on lower dose, with Imodium, and ruling out certain foods such as coconut and buttermilk.  She has some arthritic joint pain well-managed with ibuprofen PM.  Denies new or worsening pain.  She cannot really feel the left breast mass.  Denies any other new or worsening complaints.  ROS  All other systems reviewed and negative  Past Medical History:  Diagnosis Date   CLL (chronic lymphoblastic leukemia)    CLL (chronic lymphocytic leukemia) (HCC) 06/12/2012   Family history of breast cancer    Family history of ovarian cancer    Family history of prostate cancer    Fever 03/28/2016   Hypercalcemia    Hyperparathyroidism, unspecified (HCC)    Hypothyroidism    Lung nodule 07/16/2013     Past Surgical History:  Procedure Laterality Date   BREAST BIOPSY Left    benign   BREAST BIOPSY Left 01/29/2023   times 2   BREAST BIOPSY Left 01/29/2023   [place] LT BREAST BX W LOC DEV 1ST LESION IMG BX SPEC US GUIDE 01/29/2023 [facility] M[facility]MO[facility]   IR FLUORO GUIDED NEEDLE PLC ASPIRATION/INJECTION LOC  02/14/2023   KNEE ARTHROSCOPY Left    THYROIDECTOMY       Outpatient Encounter Medications as of 09/11/2023  Medication Sig   BIOTIN PO Take by mouth daily. OTC   cholecalciferol (VITAMIN D) 1000 UNITS tablet Take 2,000 Units by mouth daily.   levothyroxine (SYNTHROID) 75 MCG tablet Take 75 mcg by mouth daily.   Multiple Vitamin (MULTIVITAMIN) tablet Take 1 tablet by mouth daily.   ondansetron (ZOFRAN) 8 MG tablet Take 1 tablet (8 mg total) by mouth every 8 (eight) hours as needed for nausea or vomiting.   saccharomyces boulardii (FLORASTOR) 250 MG capsule 1 tablet Orally once a day   VERZENIO 50 MG tablet Take 1 tablet (50 mg total) by mouth 2 (two) times daily.   [DISCONTINUED] ALPRAZolam (XANAX) 0.25 MG tablet Take 1 tablet (0.25 mg total) by mouth  at bedtime as needed for anxiety.   [DISCONTINUED] anastrozole (ARIMIDEX) 1 MG tablet Take 1 tablet by mouth once daily   [DISCONTINUED] omeprazole (PRILOSEC) 40 MG capsule Take 1 capsule by mouth once daily   ALPRAZolam (XANAX) 0.25 MG tablet Take 1 tablet (0.25 mg total) by mouth at bedtime as needed for anxiety.   anastrozole (ARIMIDEX) 1 MG tablet Take 1 tablet (1 mg total) by mouth daily.   omeprazole (PRILOSEC) 40 MG capsule Take 1 capsule (40 mg total) by mouth daily.   No facility-administered encounter medications on file as of 09/11/2023.     Today's Vitals   09/11/23 0942 09/11/23 0947  BP: (!) 160/69   Pulse: 70   Resp: 18   SpO2: 100%   Weight: 157 lb 1.6 oz (71.3 kg)   Height: 5\' 2"  (1.575 m)   PainSc:  0-No pain   Body mass index is 28.73 kg/m.   PHYSICAL EXAM GENERAL:alert, no distress and comfortable SKIN: no rash  EYES: sclera clear NECK: without mass LYMPH:  no palpable cervical or supraclavicular lymphadenopathy  LUNGS: normal breathing effort HEART:  no lower extremity edema NEURO: alert & oriented x 3 with fluent speech, no focal motor/sensory deficits Breast exam: Faint density in the left lateral breast at the site of the previously palpable mass.  No left axillary adenopathy    CBC    Component Value Date/Time   WBC 43.4 (H) 09/11/2023 0919   WBC 47.2 (  H) 08/24/2022 0921   RBC 3.75 (L) 09/11/2023 0919   HGB 12.2 09/11/2023 0919   HGB 13.9 07/22/2017 0939   HCT 37.6 09/11/2023 0919   HCT 42.3 07/22/2017 0939   PLT 224 09/11/2023 0919   PLT 217 07/22/2017 0939   MCV 100.3 (H) 09/11/2023 0919   MCV 92.4 07/22/2017 0939   MCH 32.5 09/11/2023 0919   MCHC 32.4 09/11/2023 0919   RDW 12.9 09/11/2023 0919   RDW 13.5 07/22/2017 0939   LYMPHSABS 39.1 (H) 09/11/2023 0919   LYMPHSABS 31.4 (H) 07/22/2017 0939   MONOABS 0.4 09/11/2023 0919   MONOABS 0.7 07/22/2017 0939   EOSABS 0.1 09/11/2023 0919   EOSABS 0.2 07/22/2017 0939   BASOSABS 0.2 (H)  09/11/2023 0919   BASOSABS 0.1 07/22/2017 0939     CMP     Component Value Date/Time   NA 141 09/11/2023 0919   NA 143 07/19/2016 0910   K 4.0 09/11/2023 0919   K 4.6 07/19/2016 0910   CL 108 09/11/2023 0919   CL 104 12/11/2012 1408   CO2 29 09/11/2023 0919   CO2 28 07/19/2016 0910   GLUCOSE 87 09/11/2023 0919   GLUCOSE 115 07/19/2016 0910   GLUCOSE 94 12/11/2012 1408   BUN 16 09/11/2023 0919   BUN 11.5 07/19/2016 0910   CREATININE 0.80 09/11/2023 0919   CREATININE 0.8 07/19/2016 0910   CALCIUM 10.6 (H) 09/11/2023 0919   CALCIUM 10.6 (H) 07/19/2016 0910   PROT 5.8 (L) 09/11/2023 0919   PROT 6.9 07/19/2016 0910   ALBUMIN 4.1 09/11/2023 0919   ALBUMIN 3.8 07/19/2016 0910   AST 18 09/11/2023 0919   AST 28 07/19/2016 0910   ALT 18 09/11/2023 0919   ALT 52 07/19/2016 0910   ALKPHOS 75 09/11/2023 0919   ALKPHOS 114 07/19/2016 0910   BILITOT 0.4 09/11/2023 0919   BILITOT 0.32 07/19/2016 0910   GFRNONAA >60 09/11/2023 0919   GFRAA >60 07/22/2018 1111     ASSESSMENT & PLAN:Ewelina V Burckhard is a 72 y.o. female with    Breast cancer metastasized to axillary lymph node, left Stage IV, ([id number]), ER+/PR+/HER2 low expression, Grade 2, with bone metastasis  -Patient presented with a palpable left breast mass and enlarged left axillary lymph nodes in [id number].  Previous mammogram 1 year ago was negative. -Breast and left axillary lymph node biopsy, both showed grade 2 invasive ductal carcinoma, ER 100% and PR 20% strongly positive, HER2 IHC 2+, FISH negative. -PET scan showed hypermetabolic left breast, left axilla and subpectoral nodes, and multiple spinal bone mets.  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  Prognostic panel showed ER+ PR - and HER2 - -baseline CA 27.29 and 15.3 both normal  -She began first line palliative anastrozole in 01/2023 and verzenio BID 02/28/23, dose reduced to 100 mg BID on 04/17/23,  -Restaging PET  07/04/23 showed significant partial response, no new sites of disease.  -Verzenio further reduced to 50 mg BID on 07/10/23, tolerating better with stable fatigue and improved diarrhea. -We had a lengthy discussion about her treatment regimen, and the risk/benefit and GOC component of gradually titrating Verzenio to 50 mg a.m. and 100 mg p.m.  -Labs are stable, exam shows left breast mass has essentially resolved, no other clinical concern for progression -In light of the above we decided to continue 50 mg twice daily for now.  -Lab/follow-up in 6 weeks, then will order restaging PET to be done ~11/2023   Chronic  lymphocytic leukemia (CLL) -WBC increased significantly from December/2023 to May/2024 up to 66 -She has been followed by Dr. Bertis Ruddy, asymptomatic from CLL, and no other cytopenias, there has been no indication for treatment -monitoring    Right knee/leg pain -Onset with breast cancer dx -Bilateral doppler 02/19/23 negative for DVT, but showed cystic structure in the right popliteal fossa strain 3.5 x 0.5 x 1.7 cm -arthritic pain is stable, well managed with NSAIDs as needed     PLAN: -Labs reviewed -Breast exam shows left br mass essentially resolved, c/w clinical response to therapy -Discussed treatment and GOC -Continue Anastrozole daily and Verzenio 50 mg BID. Confirmed with oral pharmacy delivery should arrive by 12/20 -Refilled xanax, anastrozole, and PPI -Lab and f/up in 6 weeks, will order PET to be done ~11/2023   All questions were answered. The patient knows to call the clinic with any problems, questions or concerns. No barriers to learning were detected. I spent 20 minutes counseling the patient face to face. The total time spent in the appointment was 30 minutes and more than 50% was on counseling, review of test results, and coordination of care.   Santiago Glad, NP-C 09/11/2023

## 2023-09-11 ENCOUNTER — Inpatient Hospital Stay: Payer: PPO | Attending: Hematology and Oncology

## 2023-09-11 ENCOUNTER — Encounter: Payer: Self-pay | Admitting: Nurse Practitioner

## 2023-09-11 ENCOUNTER — Inpatient Hospital Stay: Payer: PPO

## 2023-09-11 ENCOUNTER — Inpatient Hospital Stay (HOSPITAL_BASED_OUTPATIENT_CLINIC_OR_DEPARTMENT_OTHER): Payer: PPO | Admitting: Nurse Practitioner

## 2023-09-11 VITALS — BP 160/69 | HR 70 | Resp 18 | Ht 62.0 in | Wt 157.1 lb

## 2023-09-11 DIAGNOSIS — C50812 Malignant neoplasm of overlapping sites of left female breast: Secondary | ICD-10-CM | POA: Diagnosis present

## 2023-09-11 DIAGNOSIS — C773 Secondary and unspecified malignant neoplasm of axilla and upper limb lymph nodes: Secondary | ICD-10-CM | POA: Diagnosis not present

## 2023-09-11 DIAGNOSIS — C9110 Chronic lymphocytic leukemia of B-cell type not having achieved remission: Secondary | ICD-10-CM

## 2023-09-11 DIAGNOSIS — C7951 Secondary malignant neoplasm of bone: Secondary | ICD-10-CM | POA: Diagnosis present

## 2023-09-11 DIAGNOSIS — C50912 Malignant neoplasm of unspecified site of left female breast: Secondary | ICD-10-CM | POA: Diagnosis not present

## 2023-09-11 LAB — CMP (CANCER CENTER ONLY)
ALT: 18 U/L (ref 0–44)
AST: 18 U/L (ref 15–41)
Albumin: 4.1 g/dL (ref 3.5–5.0)
Alkaline Phosphatase: 75 U/L (ref 38–126)
Anion gap: 4 — ABNORMAL LOW (ref 5–15)
BUN: 16 mg/dL (ref 8–23)
CO2: 29 mmol/L (ref 22–32)
Calcium: 10.6 mg/dL — ABNORMAL HIGH (ref 8.9–10.3)
Chloride: 108 mmol/L (ref 98–111)
Creatinine: 0.8 mg/dL (ref 0.44–1.00)
GFR, Estimated: >60 mL/min (ref >60)
Glucose, Bld: 87 mg/dL (ref 70–99)
Potassium: 4 mmol/L (ref 3.5–5.1)
Sodium: 141 mmol/L (ref 135–145)
Total Bilirubin: 0.4 mg/dL (ref <1.2)
Total Protein: 5.8 g/dL — ABNORMAL LOW (ref 6.5–8.1)

## 2023-09-11 LAB — CBC WITH DIFFERENTIAL (CANCER CENTER ONLY)
Abs Immature Granulocytes: 0.05 10*3/uL (ref 0.00–0.07)
Basophils Absolute: 0.2 10*3/uL — ABNORMAL HIGH (ref 0.0–0.1)
Basophils Relative: 0 %
Eosinophils Absolute: 0.1 10*3/uL (ref 0.0–0.5)
Eosinophils Relative: 0 %
HCT: 37.6 % (ref 36.0–46.0)
Hemoglobin: 12.2 g/dL (ref 12.0–15.0)
Immature Granulocytes: 0 %
Lymphocytes Relative: 91 %
Lymphs Abs: 39.1 10*3/uL — ABNORMAL HIGH (ref 0.7–4.0)
MCH: 32.5 pg (ref 26.0–34.0)
MCHC: 32.4 g/dL (ref 30.0–36.0)
MCV: 100.3 fL — ABNORMAL HIGH (ref 80.0–100.0)
Monocytes Absolute: 0.4 10*3/uL (ref 0.1–1.0)
Monocytes Relative: 1 %
Neutro Abs: 3.5 10*3/uL (ref 1.7–7.7)
Neutrophils Relative %: 8 %
Platelet Count: 224 10*3/uL (ref 150–400)
RBC: 3.75 MIL/uL — ABNORMAL LOW (ref 3.87–5.11)
RDW: 12.9 % (ref 11.5–15.5)
Smear Review: NORMAL
WBC Count: 43.4 10*3/uL — ABNORMAL HIGH (ref 4.0–10.5)
nRBC: 0 % (ref 0.0–0.2)

## 2023-09-11 MED ORDER — SODIUM CHLORIDE 0.9 % IV SOLN: Freq: Once | INTRAVENOUS | Status: AC

## 2023-09-11 MED ORDER — ANASTROZOLE 1 MG PO TABS: 1.0000 mg | ORAL_TABLET | Freq: Every day | ORAL | 3 refills | Status: DC

## 2023-09-11 MED ORDER — OMEPRAZOLE 40 MG PO CPDR: 40.0000 mg | DELAYED_RELEASE_CAPSULE | Freq: Every day | ORAL | 3 refills | Status: DC

## 2023-09-11 MED ORDER — ZOLEDRONIC ACID 4 MG/100ML IV SOLN
4.0000 mg | Freq: Once | INTRAVENOUS | Status: AC
  Administered 2023-09-11: 4 mg via INTRAVENOUS
  Filled 2023-09-11: qty 100

## 2023-09-11 MED ORDER — ALPRAZOLAM 0.25 MG PO TABS: 0.2500 mg | ORAL_TABLET | Freq: Every evening | ORAL | 0 refills | Status: AC | PRN

## 2023-09-13 ENCOUNTER — Other Ambulatory Visit: Payer: Self-pay

## 2023-09-23 ENCOUNTER — Encounter: Payer: Self-pay | Admitting: Hematology

## 2023-09-23 ENCOUNTER — Other Ambulatory Visit: Payer: Self-pay | Admitting: Internal Medicine

## 2023-09-23 DIAGNOSIS — E042 Nontoxic multinodular goiter: Secondary | ICD-10-CM

## 2023-09-30 ENCOUNTER — Encounter: Payer: Self-pay | Admitting: Hematology

## 2023-09-30 ENCOUNTER — Ambulatory Visit
Admission: RE | Admit: 2023-09-30 | Discharge: 2023-09-30 | Disposition: A | Discharge status: Home / Self Care | Payer: PPO | Source: Ambulatory Visit | Attending: Internal Medicine | Admitting: Internal Medicine

## 2023-09-30 DIAGNOSIS — E042 Nontoxic multinodular goiter: Secondary | ICD-10-CM

## 2023-10-02 ENCOUNTER — Other Ambulatory Visit: Payer: Self-pay | Admitting: Hematology

## 2023-10-07 ENCOUNTER — Other Ambulatory Visit: Payer: Self-pay

## 2023-10-07 MED ORDER — VERZENIO 50 MG PO TABS: 50.0000 mg | ORAL_TABLET | Freq: Two times a day (BID) | ORAL | 1 refills | Status: DC

## 2023-10-07 NOTE — Progress Notes (Signed)
 Pt called requesting a refill on her Verzenio.  Refill sent to [facility].

## 2023-10-11 ENCOUNTER — Other Ambulatory Visit (HOSPITAL_COMMUNITY): Payer: Self-pay

## 2023-10-16 ENCOUNTER — Encounter: Payer: Self-pay | Admitting: Hematology

## 2023-10-18 ENCOUNTER — Other Ambulatory Visit: Payer: Self-pay

## 2023-10-18 ENCOUNTER — Telehealth: Payer: Self-pay

## 2023-10-18 NOTE — Telephone Encounter (Signed)
Patient called in stating she has a head cold with cough,runny nose, has had a low grade fever with chills and some body aches. She is also having bad vertigo were when she stands up she is a little dizzy.  The dizziness has been going on for about 4-5 days. Its a little better this morning. I told patient she needs to take a covid test to insure she doesn't have covid. Patient wanted to know if the dizziness is a side effect of her Verzinio. Told patient we would call her back with an update once I speak to Dr. Mosetta Putt.

## 2023-10-18 NOTE — Telephone Encounter (Signed)
Called patient and relayed the message below to her husband, he stated she took a covid test and it was negative.He had no further questions or concerns.    I suspect her dizziness if probably related to her URI, ask her to test covid and flu, increase fluid intake. OK to hold verzenio if she is not feeling well and resume when she recovers, thx  Malachy Mood    Bevelyn Ngo, MD4 hours ago (10:30 AM)    How should I advise patient?   You4 hours ago (10:30 AM)    Patient called in stating she has a head cold with cough,runny nose, has had a low grade fever with chills and some body aches. She is also having bad vertigo were when she stands up she is a little dizzy.  The dizziness has been going on for about 4-5 days. Its a little better this morning. I told patient she needs to take a covid test to insure she doesn't have covid. Patient wanted to know if the dizziness is a side effect of her Verzinio. Told patient we would call her back with an update once I speak to Dr. Mosetta Putt.

## 2023-10-22 NOTE — Assessment & Plan Note (Signed)
-  She has metastatic breast cancer to bones, confirmed by biopsy. -We discussed cancer related bone pain and risk of fracture, she is asymptomatic now. -I recommend Zometa infusion every 3 months, she started in June 2024.

## 2023-10-22 NOTE — Assessment & Plan Note (Signed)
Stage IV, ([id number]), ER+/PR+/HER2 low expression, Grade 2, with bone metastasis  -Patient presented with a palpable left breast mass and enlarged left axillary lymph nodes in April 2024.  Previous mammogram 1 year ago was negative. -I reviewed her breast and left axillary lymph node biopsy, both showed grade 2 invasive ductal carcinoma, ER 100% and PR 20% strongly positive, HER2 IHC 2+, FISH negative. -I personally reviewed her PET scan images with patient and her husband, it showed hypermetabolic left breast, left axilla and subpectoral nodes, and multiple spinal bone mets.  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  Prognostic panel results still pending. -Due to her metastatic disease, upfront breast surgery is not recommended.  Patient was seen by breast surgeon Dr. Donell Beers  -I recommend first-line systemic therapy with aromatase inhibitor and CDK 4/6 inhibitor, anastrozole and Verzenio, she started in June 2024.  Due to significant fatigue and diarrhea, Verzenio was dose reduced to 100 mg twice daily.  She has been tolerating low dose well overall

## 2023-10-22 NOTE — Assessment & Plan Note (Signed)
-  stage 0, no indication for therapy -Will continue new monitoring CBC

## 2023-10-23 ENCOUNTER — Encounter: Payer: Self-pay | Admitting: Hematology

## 2023-10-23 ENCOUNTER — Inpatient Hospital Stay: Payer: PPO

## 2023-10-23 ENCOUNTER — Inpatient Hospital Stay: Payer: PPO | Attending: Hematology and Oncology | Admitting: Hematology

## 2023-10-23 VITALS — BP 148/67 | HR 81 | Temp 97.4°F | Resp 18 | Wt 155.2 lb

## 2023-10-23 DIAGNOSIS — Z803 Family history of malignant neoplasm of breast: Secondary | ICD-10-CM | POA: Diagnosis not present

## 2023-10-23 DIAGNOSIS — C50812 Malignant neoplasm of overlapping sites of left female breast: Secondary | ICD-10-CM | POA: Insufficient documentation

## 2023-10-23 DIAGNOSIS — Z806 Family history of leukemia: Secondary | ICD-10-CM | POA: Insufficient documentation

## 2023-10-23 DIAGNOSIS — C9110 Chronic lymphocytic leukemia of B-cell type not having achieved remission: Secondary | ICD-10-CM | POA: Diagnosis not present

## 2023-10-23 DIAGNOSIS — Z8041 Family history of malignant neoplasm of ovary: Secondary | ICD-10-CM | POA: Diagnosis not present

## 2023-10-23 DIAGNOSIS — Z79811 Long term (current) use of aromatase inhibitors: Secondary | ICD-10-CM | POA: Insufficient documentation

## 2023-10-23 DIAGNOSIS — C50912 Malignant neoplasm of unspecified site of left female breast: Secondary | ICD-10-CM

## 2023-10-23 DIAGNOSIS — C773 Secondary and unspecified malignant neoplasm of axilla and upper limb lymph nodes: Secondary | ICD-10-CM | POA: Diagnosis not present

## 2023-10-23 DIAGNOSIS — C7951 Secondary malignant neoplasm of bone: Secondary | ICD-10-CM | POA: Insufficient documentation

## 2023-10-23 DIAGNOSIS — Z79899 Other long term (current) drug therapy: Secondary | ICD-10-CM | POA: Diagnosis not present

## 2023-10-23 DIAGNOSIS — Z170 Estrogen receptor positive status [ER+]: Secondary | ICD-10-CM | POA: Diagnosis not present

## 2023-10-23 LAB — CBC WITH DIFFERENTIAL (CANCER CENTER ONLY)
Abs Immature Granulocytes: 0.05 10*3/uL (ref 0.00–0.07)
Basophils Absolute: 0 10*3/uL (ref 0.0–0.1)
Basophils Relative: 0 %
Eosinophils Absolute: 0.1 10*3/uL (ref 0.0–0.5)
Eosinophils Relative: 0 %
HCT: 39.1 % (ref 36.0–46.0)
Hemoglobin: 12.8 g/dL (ref 12.0–15.0)
Immature Granulocytes: 0 %
Lymphocytes Relative: 90 %
Lymphs Abs: 33.9 10*3/uL — ABNORMAL HIGH (ref 0.7–4.0)
MCH: 32.2 pg (ref 26.0–34.0)
MCHC: 32.7 g/dL (ref 30.0–36.0)
MCV: 98.2 fL (ref 80.0–100.0)
Monocytes Absolute: 0.2 10*3/uL (ref 0.1–1.0)
Monocytes Relative: 0 %
Neutro Abs: 3.7 10*3/uL (ref 1.7–7.7)
Neutrophils Relative %: 10 %
Platelet Count: 246 10*3/uL (ref 150–400)
RBC: 3.98 MIL/uL (ref 3.87–5.11)
RDW: 12.8 % (ref 11.5–15.5)
Smear Review: NORMAL
WBC Count: 37.9 10*3/uL — ABNORMAL HIGH (ref 4.0–10.5)
nRBC: 0 % (ref 0.0–0.2)

## 2023-10-23 LAB — CMP (CANCER CENTER ONLY)
ALT: 16 U/L (ref 0–44)
AST: 16 U/L (ref 15–41)
Albumin: 4.3 g/dL (ref 3.5–5.0)
Alkaline Phosphatase: 67 U/L (ref 38–126)
Anion gap: 3 — ABNORMAL LOW (ref 5–15)
BUN: 18 mg/dL (ref 8–23)
CO2: 32 mmol/L (ref 22–32)
Calcium: 11.2 mg/dL — ABNORMAL HIGH (ref 8.9–10.3)
Chloride: 105 mmol/L (ref 98–111)
Creatinine: 0.91 mg/dL (ref 0.44–1.00)
GFR, Estimated: >60 mL/min (ref >60)
Glucose, Bld: 90 mg/dL (ref 70–99)
Potassium: 4.4 mmol/L (ref 3.5–5.1)
Sodium: 140 mmol/L (ref 135–145)
Total Bilirubin: 0.4 mg/dL (ref 0.0–1.2)
Total Protein: 6.6 g/dL (ref 6.5–8.1)

## 2023-10-23 NOTE — Progress Notes (Signed)
[facility]   [phone] [phone]   Clinic Follow up Note   Patient Care Team: [name], MD as PCP - General (Family Medicine) Talmage Coin, MD as Attending Physician (Internal Medicine) Pershing Proud, RN as Oncology Nurse Navigator Donnelly Angelica, RN as Oncology Nurse Navigator Malachy Mood, MD as Consulting Physician (Hematology)  Date of Service:  10/23/2023  CHIEF COMPLAINT: f/u of metastatic breast cancer  CURRENT THERAPY:  Anastrozole and Verzenio 50 mg twice daily  Oncology History   Breast cancer metastasized to axillary lymph node, left (HCC) Stage IV, ([id number]), ER+/PR+/HER2 low expression, Grade 2, with bone metastasis  -Patient presented with a palpable left breast mass and enlarged left axillary lymph nodes in April 2024.  Previous mammogram 1 year ago was negative. -I reviewed her breast and left axillary lymph node biopsy, both showed grade 2 invasive ductal carcinoma, ER 100% and PR 20% strongly positive, HER2 IHC 2+, FISH negative. -I personally reviewed her PET scan images with patient and her husband, it showed hypermetabolic left breast, left axilla and subpectoral nodes, and multiple spinal bone mets.  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  Prognostic panel results still pending. -Due to her metastatic disease, upfront breast surgery is not recommended.  Patient was seen by breast surgeon Dr. Donell Beers  -I recommend first-line systemic therapy with aromatase inhibitor and CDK 4/6 inhibitor, anastrozole and Verzenio, she started in June 2024.  Due to significant fatigue and diarrhea, Verzenio was dose reduced to 100 mg twice daily.  She has been tolerating low dose well overall   CLL (chronic lymphocytic leukemia) -stage 0, no indication for therapy -Will continue new monitoring CBC  Metastasis to bone [facility]) -She has metastatic breast cancer to bones,  confirmed by biopsy. -We discussed cancer related bone pain and risk of fracture, she is asymptomatic now. -I recommend Zometa infusion every 3 months, she started in June 2024.    Assessment and Plan    Metastatic Breast Cancer Follow-up for metastatic breast cancer. Experienced viral infection with chills, cough, headache, dizziness, and weakness. Temporarily stopped Verzenio (abemaciclib) for two days, alleviating some symptoms. Currently on 50 mg of Verzenio twice daily. Addressed concerns about medication effectiveness and potential brain metastasis; HER2-negative breast cancer rarely metastasizes to the brain. Scheduled for a PET scan to monitor cancer status. Discussed increasing Verzenio dosage to 100 mg at night and 50 mg in the morning, but deferred due to current fatigue. - Order PET scan for late February - Review PET scan results at next visit - Continue Verzenio 50 mg twice daily - Schedule follow-up visit in four weeks  Hypercalcemia Slightly elevated calcium levels. Received Zemaira infusion in December, which helps reduce calcium levels by moving minerals from blood to bones. Advised to stay hydrated to prevent further elevation of calcium levels. Zemaira infusion decreases calcium levels and strengthens bones, preventing fractures. - Administer Zemaira infusion at next visit - Advise increased water intake - Avoid calcium supplements - Continue vitamin D supplementation  Chronic Lymphocytic Leukemia (CLL) Decreased white blood cell count, likely due to Verzenio. Lymphocyte count decreased but remains elevated. Not anemic, neutrophil count normal. Advised caution during winter months due to immunosuppressive effects of Verzenio. - Monitor blood counts regularly - Advise caution to avoid infections, especially during winter  General Health Maintenance Advised to maintain hydration, avoid overexertion, and continue light exercise to manage joint stiffness and overall  health. Discussed using heating pad  or Tylenol for back pain. Encouraged to drink more water, especially when calcium levels are high to prevent fatigue and confusion. - Encourage light exercise and stretching - Use heating pad or Tylenol for back pain - Continue taking vitamin D - Avoid calcium supplements - Drink more water  Plan -Lab reviewed, will continue Verzenio at 50 mg twice daily and anastrozole - Schedule follow-up visit in four weeks - Order PET scan for late February - Administer Zometa infusion at next visit.         SUMMARY OF ONCOLOGIC HISTORY: Oncology History Overview Note   Cancer Staging  Breast cancer metastasized to axillary lymph node, left (HCC) Staging form: Breast, AJCC 8th Edition - Clinical stage from 02/01/2023: Stage IV (cT2, cN1(f), cM1, G2, ER+, PR+, HER2: Equivocal) - Signed by Malachy Mood, MD on 02/08/2023 Stage prefix: Initial diagnosis Method of lymph node assessment: Fine needle aspiration Histologic grading system: 3 grade system  CLL (chronic lymphocytic leukemia) (HCC) Staging form: Chronic Lymphocytic Leukemia / Small Lymphocytic Lymphoma, AJCC 8th Edition - Clinical stage from 08/24/2021: Modified Rai Stage I (Modified Rai risk: Intermediate, Lymphocytosis: Present, Adenopathy: Present, Organomegaly: Absent, Anemia: Absent, Thrombocytopenia: Absent) - Signed by Artis Delay, MD on 09/03/2022 Stage prefix: Initial diagnosis     CLL (chronic lymphocytic leukemia) (HCC)  06/12/2012 Initial Diagnosis   CLL (chronic lymphocytic leukemia)   07/20/2014 Pathology Results   FISH was normal   08/24/2021 Cancer Staging   Staging form: Chronic Lymphocytic Leukemia / Small Lymphocytic Lymphoma, AJCC 8th Edition - Clinical stage from 08/24/2021: Modified Rai Stage I (Modified Rai risk: Intermediate, Lymphocytosis: Present, Adenopathy: Present, Organomegaly: Absent, Anemia: Absent, Thrombocytopenia: Absent) - Signed by Artis Delay, MD on 09/03/2022 Stage  prefix: Initial diagnosis   09/03/2022 Imaging   1. Isolated enlarged 16 mm short axis left axillary node. No other lymphadenopathy in the chest, abdomen, or pelvis. 2. 2.1 cm right thyroid nodule. Recommend thyroid US (ref: J Am Coll Radiol. 2015 Feb;12(2): 143-50). 3. Tiny bilateral nonobstructing renal stones.   03/18/2023 Genetic Testing   Negative genetic testing on the CancerNext-Expanded panel test.  The report date is March 18, 2023.  The CancerNext-Expanded gene panel offered by [employer] and includes sequencing and rearrangement analysis for the following 71 genes: AIP, ALK, APC, ATM, BAP1, BARD1, BMPR1A, BRCA1, BRCA2, BRIP1, CDC73, CDH1, CDK4, CDKN1B, CDKN2A, CHEK2, DICER1, FH, FLCN, KIF1B, LZTR1, MAX, MEN1, MET, MLH1, MSH2, MSH6, MUTYH, NF1, NF2, NTHL1, PALB2, PHOX2B, PMS2, POT1, PRKAR1A, PTCH1, PTEN, RAD51C, RAD51D, RB1, RET, SDHA, SDHAF2, SDHB, SDHC, SDHD, SMAD4, SMARCA4, SMARCB1, SMARCE1, STK11, SUFU, TMEM127, TP53, TSC1, TSC2 and VHL (sequencing and deletion/duplication); AXIN2, CTNNA1, EGFR, EGLN1, HOXB13, KIT, MITF, MSH3, PDGFRA, POLD1 and POLE (sequencing only); EPCAM and GREM1 (deletion/duplication only). RNA data is routinely analyzed for use in variant interpretation for all genes.    Breast cancer metastasized to axillary lymph node, left (HCC)  01/23/2023 Imaging   IMPRESSION: 1. 2.3 cm mass in the 3 o'clock position of the left breast with imaging features highly suspicious for primary breast cancer. 2. 3.1 cm markedly enlarged left axillary lymph node most likely representing a metastatic node associated with the patient's probable primary breast cancer. A single enlarged lymph node associated with the patient's chronic lymphocytic leukemia is less likely.   RECOMMENDATION: 1. Ultrasound-guided core needle biopsy of the 2.3 cm mass in the 3 o'clock position of the left breast. 2. Ultrasound-guided core needle biopsy of the 3.1 cm enlarged left axillary lymph node. This  has  been discussed with the patient and her husband and the biopsies have been scheduled at 12:45 p.m. on 01/29/2023.   01/29/2023 Pathology Results   Patient: [name] Collected: 01/29/2023 Client: [facility] Accession: [id number] Received: 01/29/2023 Amie Portland, MD DOB: 08-21-1951 Age: 3 Gender: F Reported: 01/30/2023 [id number] Patient Ph: [phone] MRN #: [id number] [place], [place] [address] Client Acc#: Chart #: [id number] Phone: [phone] Fax: CC: [facility] CC CC: [name] REPORT OF SURGICAL PATHOLOGY Addendum: Breast Biomarker Results FINAL DIAGNOSIS Diagnosis 1. Breast, left, needle core biopsy, 3 o'clock, mass, coil clip - INVASIVE MAMMARY CARCINOMA, SEE NOTE - MAMMARY CARCINOMA IN SITU, INTERMEDIATE GRADE - TUBULE FORMATION: SCORE 3 - NUCLEAR PLEOMORPHISM: SCORE 3 - MITOTIC COUNT: SCORE 1 - TOTAL SCORE: 7 - OVERALL GRADE: 2 - LYMPHOVASCULAR INVASION: NOT IDENTIFIED - CANCER LENGTH: 1.3 CM - CALCIFICATIONS: PRESENT, RARE - OTHER FINDINGS: INTRADUCTAL PAPILLOMA WITH ASSOCIATED MICROCALCIFICATION 2. Lymph node, needle/core biopsy, axilla, lymph node, hydromark clip - METASTATIC CARCINOMA - GREATEST DIMENSION: 1.2 CM - NO DEFINITIVE LYMPHOID TISSUE IDENTIFIED - SEE COMMENT Diagnosis Note 1. -2. Dr. Luisa Hart reviewed the case and concurs with the interpretation. Immunohistochemical staining for E-cadherin and a breast prognostic profile (ER, PR, Ki-67 and HER2) on block 1A is pending and will be reported in an addendum. 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  1. Breast, left, needle core biopsy, 3 o'clock, mass, coil clip PROGNOSTIC INDICATORS Results: IMMUNOHISTOCHEMICAL AND MORPHOMETRIC ANALYSIS PERFORMED MANUALLY The tumor cells are EQUIVOCAL for Her2 (2+). Her2 by FISH will be performed and the results reported separately. Estrogen Receptor: 100%, POSITIVE, STRONG STAINING  INTENSITY Progesterone Receptor: 20%, POSITIVE, STRONG STAINING INTENSITY Proliferation Marker Ki67: 45%    02/01/2023 Initial Diagnosis   Breast cancer metastasized to axillary lymph node, left (HCC)   02/01/2023 Cancer Staging   Staging form: Breast, AJCC 8th Edition - Clinical stage from 02/01/2023: Stage IV (cT2, cN1(f), cM1, G2, ER+, PR+, HER2: Equivocal) - Signed by Malachy Mood, MD on 02/08/2023 Stage prefix: Initial diagnosis Method of lymph node assessment: Fine needle aspiration Histologic grading system: 3 grade system   02/07/2023 PET scan   1. Hypermetabolic left breast mass consistent with known primary breast malignancy. 2. Hypermetabolic nodal metastases in the left axilla and subpectoral regions. 3. No evidence of metastatic disease within the abdomen or pelvis. 4. Several hypermetabolic osseous lesions in the thoracolumbar spine and right ribs, consistent with osseous metastatic disease. 5. Nonobstructing bilateral renal calculi.   03/18/2023 Genetic Testing   Negative genetic testing on the CancerNext-Expanded panel test.  The report date is March 18, 2023.  The CancerNext-Expanded gene panel offered by [employer] and includes sequencing and rearrangement analysis for the following 71 genes: AIP, ALK, APC, ATM, BAP1, BARD1, BMPR1A, BRCA1, BRCA2, BRIP1, CDC73, CDH1, CDK4, CDKN1B, CDKN2A, CHEK2, DICER1, FH, FLCN, KIF1B, LZTR1, MAX, MEN1, MET, MLH1, MSH2, MSH6, MUTYH, NF1, NF2, NTHL1, PALB2, PHOX2B, PMS2, POT1, PRKAR1A, PTCH1, PTEN, RAD51C, RAD51D, RB1, RET, SDHA, SDHAF2, SDHB, SDHC, SDHD, SMAD4, SMARCA4, SMARCB1, SMARCE1, STK11, SUFU, TMEM127, TP53, TSC1, TSC2 and VHL (sequencing and deletion/duplication); AXIN2, CTNNA1, EGFR, EGLN1, HOXB13, KIT, MITF, MSH3, PDGFRA, POLD1 and POLE (sequencing only); EPCAM and GREM1 (deletion/duplication only). RNA data is routinely analyzed for use in variant interpretation for all genes.       Discussed the use of AI scribe software for  clinical note transcription with the patient, who gave verbal consent to proceed.  History of Present Illness  A 73 year old female with a history of metastatic breast cancer presents for follow-up. She reports having been unwell since Christmas, initially experiencing symptoms of a cold, including congestion, infection, and a sore throat. Approximately ten days later, she developed chills, a severe cough, headache, and significant dizziness to the point of seeing double. She stopped her medication for two days and started feeling a little better. However, she reports persistent weakness, which she attributes to her recent illness. She notes that her appetite is back, and she has been eating well. She also mentions a recent weight loss of two pounds, which she is gradually regaining. The patient's husband confirms that she was doing well before this illness, with her energy levels improving. However, she had a sudden decline in health after catching a cold and becoming very congested. The patient also reports recent back pain, which is tolerable and comes and goes. She has been managing it with a heating pad and Advil PM.         All other systems were reviewed with the patient and are negative.  MEDICAL HISTORY:  Past Medical History:  Diagnosis Date   CLL (chronic lymphoblastic leukemia)    CLL (chronic lymphocytic leukemia) (HCC) 06/12/2012   Family history of breast cancer    Family history of ovarian cancer    Family history of prostate cancer    Fever 03/28/2016   Hypercalcemia    Hyperparathyroidism, unspecified (HCC)    Hypothyroidism    Lung nodule 07/16/2013    SURGICAL HISTORY: Past Surgical History:  Procedure Laterality Date   BREAST BIOPSY Left    benign   BREAST BIOPSY Left 01/29/2023   times 2   BREAST BIOPSY Left 01/29/2023   [place] LT BREAST BX W LOC DEV 1ST LESION IMG BX SPEC US GUIDE 01/29/2023 [facility]AMMOGR[facility]   IR FLUORO GUIDED NEEDLE PLC ASPIRATION/INJECTION LOC   02/14/2023   KNEE ARTHROSCOPY Left    THYROIDECTOMY      I have reviewed the social history and family history with the patient and they are unchanged from previous note.  ALLERGIES:  is allergic to tizanidine.  MEDICATIONS:  Current Outpatient Medications  Medication Sig Dispense Refill   ALPRAZolam (XANAX) 0.25 MG tablet Take 1 tablet (0.25 mg total) by mouth at bedtime as needed for anxiety. 30 tablet 0   anastrozole (ARIMIDEX) 1 MG tablet Take 1 tablet (1 mg total) by mouth daily. 90 tablet 3   cholecalciferol (VITAMIN D) 1000 UNITS tablet Take 2,000 Units by mouth daily.     levothyroxine (SYNTHROID) 75 MCG tablet Take 75 mcg by mouth daily.     Multiple Vitamin (MULTIVITAMIN) tablet Take 1 tablet by mouth daily.     omeprazole (PRILOSEC) 40 MG capsule Take 1 capsule (40 mg total) by mouth daily. 30 capsule 3   ondansetron (ZOFRAN) 8 MG tablet Take 1 tablet (8 mg total) by mouth every 8 (eight) hours as needed for nausea or vomiting. 45 tablet 1   VERZENIO 50 MG tablet Take 1 tablet (50 mg total) by mouth 2 (two) times daily. 56 tablet 1   No current facility-administered medications for this visit.    PHYSICAL EXAMINATION: ECOG PERFORMANCE STATUS: 1 - Symptomatic but completely ambulatory  Vitals:   10/23/23 1019  BP: (!) 148/67  Pulse: 81  Resp: 18  Temp: (!) 97.4 F (36.3 C)  SpO2: 98%   Wt Readings from Last 3 Encounters:  10/23/23 155 lb 3.2 oz (70.4 kg)  09/11/23 157 lb  1.6 oz (71.3 kg)  07/10/23 158 lb 3.2 oz (71.8 kg)     GENERAL:alert, no distress and comfortable SKIN: skin color, texture, turgor are normal, no rashes or significant lesions EYES: normal, Conjunctiva are pink and non-injected, sclera clear NECK: supple, thyroid normal size, non-tender, without nodularity LYMPH:  no palpable lymphadenopathy in the cervical, axillary  LUNGS: clear to auscultation and percussion with normal breathing effort HEART: regular rate & rhythm and no murmurs and no  lower extremity edema ABDOMEN:abdomen soft, non-tender and normal bowel sounds Musculoskeletal:no cyanosis of digits and no clubbing  NEURO: alert & oriented x 3 with fluent speech, no focal motor/sensory deficits     LABORATORY DATA:  I have reviewed the data as listed    Latest Ref Rng & Units 10/23/2023    9:43 AM 09/11/2023    9:19 AM 07/10/2023    9:48 AM  CBC  WBC 4.0 - 10.5 K/uL 37.9  43.4  45.7   Hemoglobin 12.0 - 15.0 g/dL 16.0  73.7  10.6   Hematocrit 36.0 - 46.0 % 39.1  37.6  35.3   Platelets 150 - 400 K/uL 246  224  209         Latest Ref Rng & Units 10/23/2023    9:43 AM 09/11/2023    9:19 AM 07/10/2023    9:48 AM  CMP  Glucose 70 - 99 mg/dL 90  87  91   BUN 8 - 23 mg/dL 18  16  17    Creatinine 0.44 - 1.00 mg/dL 2.69  4.85  4.62   Sodium 135 - 145 mmol/L 140  141  139   Potassium 3.5 - 5.1 mmol/L 4.4  4.0  4.1   Chloride 98 - 111 mmol/L 105  108  104   CO2 22 - 32 mmol/L 32  29  27   Calcium 8.9 - 10.3 mg/dL 70.3  50.0  93.8   Total Protein 6.5 - 8.1 g/dL 6.6  5.8  6.4   Total Bilirubin 0.0 - 1.2 mg/dL 0.4  0.4  0.3   Alkaline Phos 38 - 126 U/L 67  75  66   AST 15 - 41 U/L 16  18  17    ALT 0 - 44 U/L 16  18  18        RADIOGRAPHIC STUDIES: I have personally reviewed the radiological images as listed and agreed with the findings in the report. No results found.    Orders Placed This Encounter  Procedures   NM PET Image Restag (PS) Skull Base To Thigh    Standing Status:   Future    Expected Date:   11/13/2023    Expiration Date:   10/22/2024    If indicated for the ordered procedure, I authorize the administration of a radiopharmaceutical per Radiology protocol:   Yes    Preferred imaging location?:   [facility]   All questions were answered. The patient knows to call the clinic with any problems, questions or concerns. No barriers to learning was detected. The total time spent in the appointment was 30 minutes.     Malachy Mood, MD 10/23/2023

## 2023-11-01 ENCOUNTER — Other Ambulatory Visit (HOSPITAL_COMMUNITY): Payer: Self-pay

## 2023-11-11 ENCOUNTER — Encounter (HOSPITAL_COMMUNITY)
Admission: RE | Admit: 2023-11-11 | Discharge: 2023-11-11 | Disposition: A | Discharge status: Home / Self Care | Payer: PPO | Source: Ambulatory Visit | Attending: Hematology | Admitting: Hematology

## 2023-11-11 DIAGNOSIS — C773 Secondary and unspecified malignant neoplasm of axilla and upper limb lymph nodes: Secondary | ICD-10-CM | POA: Insufficient documentation

## 2023-11-11 DIAGNOSIS — C50912 Malignant neoplasm of unspecified site of left female breast: Secondary | ICD-10-CM | POA: Diagnosis present

## 2023-11-11 LAB — GLUCOSE, CAPILLARY: Glucose-Capillary: 85 mg/dL (ref 70–99)

## 2023-11-11 MED ORDER — FLUDEOXYGLUCOSE F - 18 (FDG) INJECTION
7.7300 | Freq: Once | INTRAVENOUS | Status: AC | PRN
  Administered 2023-11-11: 7.73 via INTRAVENOUS

## 2023-11-19 NOTE — Assessment & Plan Note (Signed)
 Stage IV, ([id number]), ER+/PR+/HER2 low expression, Grade 2, with bone metastasis  -Patient presented with a palpable left breast mass and enlarged left axillary lymph nodes in April 2024.  Previous mammogram 1 year ago was negative. -I reviewed her breast and left axillary lymph node biopsy, both showed grade 2 invasive ductal carcinoma, ER 100% and PR 20% strongly positive, HER2 IHC 2+, FISH negative. -I personally reviewed her PET scan images with patient and her husband, it showed hypermetabolic left breast, left axilla and subpectoral nodes, and multiple spinal bone mets.  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  Prognostic panel results still pending. -Due to her metastatic disease, upfront breast surgery is not recommended.  Patient was seen by breast surgeon Dr. Donell Beers  -I recommend first-line systemic therapy with aromatase inhibitor and CDK 4/6 inhibitor, anastrozole and Verzenio, she started in June 2024.  Due to significant fatigue and diarrhea, Verzenio was dose reduced to 50 mg twice daily.  She has been tolerating low dose well overall

## 2023-11-20 ENCOUNTER — Inpatient Hospital Stay: Payer: PPO | Attending: Hematology and Oncology

## 2023-11-20 ENCOUNTER — Encounter: Payer: Self-pay | Admitting: Hematology

## 2023-11-20 ENCOUNTER — Other Ambulatory Visit: Payer: Self-pay | Admitting: Hematology

## 2023-11-20 ENCOUNTER — Inpatient Hospital Stay (HOSPITAL_BASED_OUTPATIENT_CLINIC_OR_DEPARTMENT_OTHER): Payer: PPO | Admitting: Hematology

## 2023-11-20 ENCOUNTER — Inpatient Hospital Stay: Payer: PPO

## 2023-11-20 VITALS — BP 143/61 | HR 71 | Temp 97.6°F | Resp 17 | Wt 156.4 lb

## 2023-11-20 DIAGNOSIS — Z79811 Long term (current) use of aromatase inhibitors: Secondary | ICD-10-CM | POA: Diagnosis not present

## 2023-11-20 DIAGNOSIS — C50912 Malignant neoplasm of unspecified site of left female breast: Secondary | ICD-10-CM | POA: Diagnosis not present

## 2023-11-20 DIAGNOSIS — E039 Hypothyroidism, unspecified: Secondary | ICD-10-CM | POA: Insufficient documentation

## 2023-11-20 DIAGNOSIS — C9110 Chronic lymphocytic leukemia of B-cell type not having achieved remission: Secondary | ICD-10-CM | POA: Insufficient documentation

## 2023-11-20 DIAGNOSIS — C7951 Secondary malignant neoplasm of bone: Secondary | ICD-10-CM | POA: Insufficient documentation

## 2023-11-20 DIAGNOSIS — Z803 Family history of malignant neoplasm of breast: Secondary | ICD-10-CM | POA: Diagnosis not present

## 2023-11-20 DIAGNOSIS — Z170 Estrogen receptor positive status [ER+]: Secondary | ICD-10-CM | POA: Diagnosis not present

## 2023-11-20 DIAGNOSIS — C50812 Malignant neoplasm of overlapping sites of left female breast: Secondary | ICD-10-CM | POA: Diagnosis present

## 2023-11-20 DIAGNOSIS — Z8041 Family history of malignant neoplasm of ovary: Secondary | ICD-10-CM | POA: Insufficient documentation

## 2023-11-20 DIAGNOSIS — C773 Secondary and unspecified malignant neoplasm of axilla and upper limb lymph nodes: Secondary | ICD-10-CM | POA: Diagnosis not present

## 2023-11-20 LAB — CBC WITH DIFFERENTIAL (CANCER CENTER ONLY)
Abs Immature Granulocytes: 0.03 10*3/uL (ref 0.00–0.07)
Basophils Absolute: 0.2 10*3/uL — ABNORMAL HIGH (ref 0.0–0.1)
Basophils Relative: 0 %
Eosinophils Absolute: 0.8 10*3/uL — ABNORMAL HIGH (ref 0.0–0.5)
Eosinophils Relative: 2 %
HCT: 39 % (ref 36.0–46.0)
Hemoglobin: 12.6 g/dL (ref 12.0–15.0)
Immature Granulocytes: 0 %
Lymphocytes Relative: 89 %
Lymphs Abs: 34.5 10*3/uL — ABNORMAL HIGH (ref 0.7–4.0)
MCH: 31.3 pg (ref 26.0–34.0)
MCHC: 32.3 g/dL (ref 30.0–36.0)
MCV: 97 fL (ref 80.0–100.0)
Monocytes Absolute: 0.3 10*3/uL (ref 0.1–1.0)
Monocytes Relative: 1 %
Neutro Abs: 3 10*3/uL (ref 1.7–7.7)
Neutrophils Relative %: 8 %
Platelet Count: 230 10*3/uL (ref 150–400)
RBC: 4.02 MIL/uL (ref 3.87–5.11)
RDW: 13.7 % (ref 11.5–15.5)
Smear Review: NORMAL
WBC Count: 38.9 10*3/uL — ABNORMAL HIGH (ref 4.0–10.5)
nRBC: 0 % (ref 0.0–0.2)

## 2023-11-20 LAB — CMP (CANCER CENTER ONLY)
ALT: 19 U/L (ref 0–44)
AST: 18 U/L (ref 15–41)
Albumin: 4.3 g/dL (ref 3.5–5.0)
Alkaline Phosphatase: 78 U/L (ref 38–126)
Anion gap: 4 — ABNORMAL LOW (ref 5–15)
BUN: 18 mg/dL (ref 8–23)
CO2: 29 mmol/L (ref 22–32)
Calcium: 10.7 mg/dL — ABNORMAL HIGH (ref 8.9–10.3)
Chloride: 109 mmol/L (ref 98–111)
Creatinine: 0.8 mg/dL (ref 0.44–1.00)
GFR, Estimated: >60 mL/min (ref >60)
Glucose, Bld: 81 mg/dL (ref 70–99)
Potassium: 4.1 mmol/L (ref 3.5–5.1)
Sodium: 142 mmol/L (ref 135–145)
Total Bilirubin: 0.4 mg/dL (ref 0.0–1.2)
Total Protein: 6.5 g/dL (ref 6.5–8.1)

## 2023-11-20 MED ORDER — VERZENIO 50 MG PO TABS: 50.0000 mg | ORAL_TABLET | Freq: Two times a day (BID) | ORAL | 2 refills | Status: DC

## 2023-11-20 NOTE — Progress Notes (Signed)
 [facility]   Telephone:([phone]) [phone] Fax:([phone]) [phone]   Clinic Follow up Note   Patient Care Team: [name], MD as PCP - General (Family Medicine) Talmage Coin, MD as Attending Physician (Internal Medicine) Pershing Proud, RN as Oncology Nurse Navigator Donnelly Angelica, RN as Oncology Nurse Navigator Malachy Mood, MD as Consulting Physician (Hematology)  Date of Service:  11/20/2023  CHIEF COMPLAINT: f/u of metastatic breast cancer  CURRENT THERAPY:  Anastrozole and Verzenio 50 mg twice daily  Oncology History   Breast cancer metastasized to axillary lymph node, left (HCC) Stage IV, ([id number]), ER+/PR+/HER2 low expression, Grade 2, with bone metastasis  -Patient presented with a palpable left breast mass and enlarged left axillary lymph nodes in April 2024.  Previous mammogram 1 year ago was negative. -I reviewed her breast and left axillary lymph node biopsy, both showed grade 2 invasive ductal carcinoma, ER 100% and PR 20% strongly positive, HER2 IHC 2+, FISH negative. -I personally reviewed her PET scan images with patient and her husband, it showed hypermetabolic left breast, left axilla and subpectoral nodes, and multiple spinal bone mets.  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  Prognostic panel results still pending. -Due to her metastatic disease, upfront breast surgery is not recommended.  Patient was seen by breast surgeon Dr. Donell Beers  -I recommend first-line systemic therapy with aromatase inhibitor and CDK 4/6 inhibitor, anastrozole and Verzenio, she started in June 2024.  Due to significant fatigue and diarrhea, Verzenio was dose reduced to 50 mg twice daily.  She has been tolerating low dose well overall     Assessment and Plan    Metastatic Breast Cancer Metastatic breast cancer with previous involvement in the left breast, lymph nodes, bones, and spine. Recent PET scan shows  significant improvement with decreased activity in the left breast lesion, lymph nodes, and bone lesions. The spinal lesion is well-managed, and no new bone lesions are present. She tolerates the current treatment well, with reduced fatigue. The treatment is stronger than previous therapies like Ibrance and is expected to be more effective and longer-lasting. 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 White blood cell count has been decreasing, likely due to the current treatment regimen. No treatment is needed unless symptoms develop. - Monitor white blood cell count - No treatment required unless symptoms develop  Hypothyroidism She is on Synthroid for hypothyroidism, taking five full tablets and half a tablet two days a week. - Continue current Synthroid regimen     Plan -Lab reviewed -I personally reviewed her restaging PET scan from November 11, 2023, which showed overall stable disease compared to last PET 4 months ago.  The formal report is not back, we will call her when it is back -She is tolerating therapy well, will continue anastrozole and low-dose Verzenio 50 mg twice daily -Will proceed with Zometa infusion today and continue every 3 weeks -Follow-up in 3 weeks with lab and Zometa infusion.    SUMMARY OF ONCOLOGIC HISTORY: Oncology History Overview Note   Cancer Staging  Breast cancer metastasized to axillary lymph node, left (HCC) Staging form: Breast, AJCC 8th Edition - Clinical stage  from 02/01/2023: Stage IV (cT2, cN1(f), cM1, G2, ER+, PR+, HER2: Equivocal) - Signed by Malachy Mood, MD on 02/08/2023 Stage prefix: Initial  diagnosis Method of lymph node assessment: Fine needle aspiration Histologic grading system: 3 grade system  CLL (chronic lymphocytic leukemia) (HCC) Staging form: Chronic Lymphocytic Leukemia / Small Lymphocytic Lymphoma, AJCC 8th Edition - Clinical stage from 08/24/2021: Modified Rai Stage I (Modified Rai risk: Intermediate, Lymphocytosis: Present, Adenopathy: Present, Organomegaly: Absent, Anemia: Absent, Thrombocytopenia: Absent) - Signed by Artis Delay, MD on 09/03/2022 Stage prefix: Initial diagnosis     CLL (chronic lymphocytic leukemia) (HCC)  06/12/2012 Initial Diagnosis   CLL (chronic lymphocytic leukemia)   07/20/2014 Pathology Results   FISH was normal   08/24/2021 Cancer Staging   Staging form: Chronic Lymphocytic Leukemia / Small Lymphocytic Lymphoma, AJCC 8th Edition - Clinical stage from 08/24/2021: Modified Rai Stage I (Modified Rai risk: Intermediate, Lymphocytosis: Present, Adenopathy: Present, Organomegaly: Absent, Anemia: Absent, Thrombocytopenia: Absent) - Signed by Artis Delay, MD on 09/03/2022 Stage prefix: Initial diagnosis   09/03/2022 Imaging   1. Isolated enlarged 16 mm short axis left axillary node. No other lymphadenopathy in the chest, abdomen, or pelvis. 2. 2.1 cm right thyroid nodule. Recommend thyroid US (ref: J Am Coll Radiol. 2015 Feb;12(2): 143-50). 3. Tiny bilateral nonobstructing renal stones.   03/18/2023 Genetic Testing   Negative genetic testing on the CancerNext-Expanded panel test.  The report date is March 18, 2023.  The CancerNext-Expanded gene panel offered by [employer] and includes sequencing and rearrangement analysis for the following 71 genes: AIP, ALK, APC, ATM, BAP1, BARD1, BMPR1A, BRCA1, BRCA2, BRIP1, CDC73, CDH1, CDK4, CDKN1B, CDKN2A, CHEK2, DICER1, FH, FLCN, KIF1B, LZTR1, MAX, MEN1, MET, MLH1, MSH2, MSH6, MUTYH, NF1, NF2, NTHL1, PALB2, PHOX2B, PMS2, POT1, PRKAR1A, PTCH1, PTEN, RAD51C, RAD51D, RB1, RET, SDHA, SDHAF2, SDHB, SDHC,  SDHD, SMAD4, SMARCA4, SMARCB1, SMARCE1, STK11, SUFU, TMEM127, TP53, TSC1, TSC2 and VHL (sequencing and deletion/duplication); AXIN2, CTNNA1, EGFR, EGLN1, HOXB13, KIT, MITF, MSH3, PDGFRA, POLD1 and POLE (sequencing only); EPCAM and GREM1 (deletion/duplication only). RNA data is routinely analyzed for use in variant interpretation for all genes.    Breast cancer metastasized to axillary lymph node, left (HCC)  01/23/2023 Imaging   IMPRESSION: 1. 2.3 cm mass in the 3 o'clock position of the left breast with imaging features highly suspicious for primary breast cancer. 2. 3.1 cm markedly enlarged left axillary lymph node most likely representing a metastatic node associated with the patient's probable primary breast cancer. A single enlarged lymph node associated with the patient's chronic lymphocytic leukemia is less likely.   RECOMMENDATION: 1. Ultrasound-guided core needle biopsy of the 2.3 cm mass in the 3 o'clock position of the left breast. 2. Ultrasound-guided core needle biopsy of the 3.1 cm enlarged left axillary lymph node. This has been discussed with the patient and her husband and the biopsies have been scheduled at 12:45 p.m. on 01/29/2023.   01/29/2023 Pathology Results   Patient: [name] Collected: 01/29/2023 Client: [facility] Accession: [id number] Received: 01/29/2023 Amie Portland, MD DOB: [birth year] Age: 17 Gender: F Reported: 01/30/2023 [address] Patient Ph: [phone] MRN #: [id number] [place], [place] [address] Client Acc#: Chart #: [id number] Phone: [phone] Fax: CC: [facility] CC CC: [name] REPORT OF SURGICAL PATHOLOGY Addendum: Breast Biomarker Results FINAL DIAGNOSIS Diagnosis 1. Breast, left, needle core biopsy, 3 o'clock, mass, coil clip - INVASIVE MAMMARY CARCINOMA, SEE NOTE - MAMMARY CARCINOMA IN SITU, INTERMEDIATE GRADE - TUBULE FORMATION: SCORE 3 -  NUCLEAR PLEOMORPHISM: SCORE 3 - MITOTIC COUNT: SCORE 1 - TOTAL  SCORE: 7 - OVERALL GRADE: 2 - LYMPHOVASCULAR INVASION: NOT IDENTIFIED - CANCER LENGTH: 1.3 CM - CALCIFICATIONS: PRESENT, RARE - OTHER FINDINGS: INTRADUCTAL PAPILLOMA WITH ASSOCIATED MICROCALCIFICATION 2. Lymph node, needle/core biopsy, axilla, lymph node, hydromark clip - METASTATIC CARCINOMA - GREATEST DIMENSION: 1.2 CM - NO DEFINITIVE LYMPHOID TISSUE IDENTIFIED - SEE COMMENT Diagnosis Note 1. -2. Dr. Luisa Hart reviewed the case and concurs with the interpretation. Immunohistochemical staining for E-cadherin and a breast prognostic profile (ER, PR, Ki-67 and HER2) on block 1A is pending and will be reported in an addendum. 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  1. Breast, left, needle core biopsy, 3 o'clock, mass, coil clip PROGNOSTIC INDICATORS Results: IMMUNOHISTOCHEMICAL AND MORPHOMETRIC ANALYSIS PERFORMED MANUALLY The tumor cells are EQUIVOCAL for Her2 (2+). Her2 by FISH will be performed and the results reported separately. Estrogen Receptor: 100%, POSITIVE, STRONG STAINING INTENSITY Progesterone Receptor: 20%, POSITIVE, STRONG STAINING INTENSITY Proliferation Marker Ki67: 45%    02/01/2023 Initial Diagnosis   Breast cancer metastasized to axillary lymph node, left (HCC)   02/01/2023 Cancer Staging   Staging form: Breast, AJCC 8th Edition - Clinical stage from 02/01/2023: Stage IV (cT2, cN1(f), cM1, G2, ER+, PR+, HER2: Equivocal) - Signed by Malachy Mood, MD on 02/08/2023 Stage prefix: Initial diagnosis Method of lymph node assessment: Fine needle aspiration Histologic grading system: 3 grade system   02/07/2023 PET scan   1. Hypermetabolic left breast mass consistent with known primary breast malignancy. 2. Hypermetabolic nodal metastases in the left axilla and subpectoral regions. 3. No evidence of metastatic disease within the abdomen or pelvis. 4. Several hypermetabolic osseous lesions in the thoracolumbar spine and right ribs,  consistent with osseous metastatic disease. 5. Nonobstructing bilateral renal calculi.   03/18/2023 Genetic Testing   Negative genetic testing on the CancerNext-Expanded panel test.  The report date is March 18, 2023.  The CancerNext-Expanded gene panel offered by [employer] and includes sequencing and rearrangement analysis for the following 71 genes: AIP, ALK, APC, ATM, BAP1, BARD1, BMPR1A, BRCA1, BRCA2, BRIP1, CDC73, CDH1, CDK4, CDKN1B, CDKN2A, CHEK2, DICER1, FH, FLCN, KIF1B, LZTR1, MAX, MEN1, MET, MLH1, MSH2, MSH6, MUTYH, NF1, NF2, NTHL1, PALB2, PHOX2B, PMS2, POT1, PRKAR1A, PTCH1, PTEN, RAD51C, RAD51D, RB1, RET, SDHA, SDHAF2, SDHB, SDHC, SDHD, SMAD4, SMARCA4, SMARCB1, SMARCE1, STK11, SUFU, TMEM127, TP53, TSC1, TSC2 and VHL (sequencing and deletion/duplication); AXIN2, CTNNA1, EGFR, EGLN1, HOXB13, KIT, MITF, MSH3, PDGFRA, POLD1 and POLE (sequencing only); EPCAM and GREM1 (deletion/duplication only). RNA data is routinely analyzed for use in variant interpretation for all genes.       Discussed the use of AI scribe software for clinical note transcription with the patient, who gave verbal consent to proceed.  History of Present Illness   The patient, with a history of metastatic breast cancer and CLL, reports feeling better overall. She has gained weight after a period of weight loss and attributes her improved condition to her current medication regimen. She reports no diarrhea and her energy levels are improving, although she still experiences some fatigue. She is able to perform daily activities and chores. She has been taking Synthroid, Verzenio, and Zometa as prescribed. She also takes biotin and vitamin C supplements. She has a history of CLL and her white blood cell count has been decreasing. She has not required treatment for CLL.         All other systems were reviewed with the patient and are negative.  MEDICAL HISTORY:  Past Medical History:  Diagnosis Date   CLL (chronic  lymphoblastic leukemia)    CLL (chronic lymphocytic leukemia) (HCC) 06/12/2012   Family history of breast cancer    Family history of ovarian cancer    Family history of prostate cancer    Fever 03/28/2016   Hypercalcemia    Hyperparathyroidism, unspecified (HCC)    Hypothyroidism    Lung nodule 07/16/2013    SURGICAL HISTORY: Past Surgical History:  Procedure Laterality Date   BREAST BIOPSY Left    benign   BREAST BIOPSY Left 01/29/2023   times 2   BREAST BIOPSY Left 01/29/2023   [place] LT BREAST BX W LOC DEV 1ST LESION IMG BX SPEC US GUIDE 01/29/2023 [facility]   IR FLUORO GUIDED NEEDLE PLC ASPIRATION/INJECTION LOC  02/14/2023   KNEE ARTHROSCOPY Left    THYROIDECTOMY      I have reviewed the social history and family history with the patient and they are unchanged from previous note.  ALLERGIES:  is allergic to tizanidine.  MEDICATIONS:  Current Outpatient Medications  Medication Sig Dispense Refill   Ascorbic Acid (VITAMIN C) 1000 MG tablet Take 1,000 mg by mouth daily.     Biotin 5000 MCG TABS Take 5,000 mcg by mouth daily.     ALPRAZolam (XANAX) 0.25 MG tablet Take 1 tablet (0.25 mg total) by mouth at bedtime as needed for anxiety. 30 tablet 0   anastrozole (ARIMIDEX) 1 MG tablet Take 1 tablet (1 mg total) by mouth daily. 90 tablet 3   cholecalciferol (VITAMIN D) 1000 UNITS tablet Take 2,000 Units by mouth daily.     levothyroxine (SYNTHROID) 75 MCG tablet Take 75 mcg by mouth daily. 5 days a week  2 days per week     Multiple Vitamin (MULTIVITAMIN) tablet Take 1 tablet by mouth daily.     omeprazole (PRILOSEC) 40 MG capsule Take 1 capsule (40 mg total) by mouth daily. 30 capsule 3   ondansetron (ZOFRAN) 8 MG tablet Take 1 tablet (8 mg total) by mouth every 8 (eight) hours as needed for nausea or vomiting. 45 tablet 1   VERZENIO 50 MG tablet Take 1 tablet (50 mg total) by mouth 2 (two) times daily. 56 tablet 2   No current facility-administered  medications for this visit.    PHYSICAL EXAMINATION: ECOG PERFORMANCE STATUS: 1 - Symptomatic but completely ambulatory  Vitals:   11/20/23 0945 11/20/23 0946  BP: (!) 163/63 (!) 143/61  Pulse: 71   Resp: 17   Temp: 97.6 F (36.4 C)   SpO2: 99%    Wt Readings from Last 3 Encounters:  11/20/23 156 lb 6.4 oz (70.9 kg)  10/23/23 155 lb 3.2 oz (70.4 kg)  09/11/23 157 lb 1.6 oz (71.3 kg)     GENERAL:alert, no distress and comfortable SKIN: skin color, texture, turgor are normal, no rashes or significant lesions EYES: normal, Conjunctiva are pink and non-injected, sclera clear NECK: supple, thyroid normal size, non-tender, without nodularity LYMPH:  no palpable lymphadenopathy in the cervical, axillary  LUNGS: clear to auscultation and percussion with normal breathing effort HEART: regular rate & rhythm and no murmurs and no lower extremity edema ABDOMEN:abdomen soft, non-tender and normal bowel sounds Musculoskeletal:no cyanosis of digits and no clubbing  NEURO: alert & oriented x 3 with fluent speech, no focal motor/sensory deficits   LABORATORY DATA:  I have reviewed the data as listed    Latest Ref Rng & Units 11/20/2023    9:04 AM 10/23/2023  9:43 AM 09/11/2023    9:19 AM  CBC  WBC 4.0 - 10.5 K/uL 38.9  37.9  43.4   Hemoglobin 12.0 - 15.0 g/dL 16.1  09.6  04.5   Hematocrit 36.0 - 46.0 % 39.0  39.1  37.6   Platelets 150 - 400 K/uL 230  246  224         Latest Ref Rng & Units 11/20/2023    9:04 AM 10/23/2023    9:43 AM 09/11/2023    9:19 AM  CMP  Glucose 70 - 99 mg/dL 81  90  87   BUN 8 - 23 mg/dL 18  18  16    Creatinine 0.44 - 1.00 mg/dL 4.09  8.11  9.14   Sodium 135 - 145 mmol/L 142  140  141   Potassium 3.5 - 5.1 mmol/L 4.1  4.4  4.0   Chloride 98 - 111 mmol/L 109  105  108   CO2 22 - 32 mmol/L 29  32  29   Calcium 8.9 - 10.3 mg/dL 78.2  95.6  21.3   Total Protein 6.5 - 8.1 g/dL 6.5  6.6  5.8   Total Bilirubin 0.0 - 1.2 mg/dL 0.4  0.4  0.4   Alkaline Phos  38 - 126 U/L 78  67  75   AST 15 - 41 U/L 18  16  18    ALT 0 - 44 U/L 19  16  18        RADIOGRAPHIC STUDIES: I have personally reviewed the radiological images as listed and agreed with the findings in the report. No results found.    No orders of the defined types were placed in this encounter.  All questions were answered. The patient knows to call the clinic with any problems, questions or concerns. No barriers to learning was detected. The total time spent in the appointment was 30 minutes.     Malachy Mood, MD 11/20/2023

## 2023-12-03 ENCOUNTER — Other Ambulatory Visit (HOSPITAL_COMMUNITY): Payer: Self-pay

## 2023-12-04 ENCOUNTER — Inpatient Hospital Stay: Payer: PPO | Attending: Hematology and Oncology

## 2023-12-04 VITALS — BP 145/66 | HR 77 | Temp 98.0°F | Resp 17 | Wt 157.5 lb

## 2023-12-04 DIAGNOSIS — C50912 Malignant neoplasm of unspecified site of left female breast: Secondary | ICD-10-CM

## 2023-12-04 DIAGNOSIS — Z170 Estrogen receptor positive status [ER+]: Secondary | ICD-10-CM | POA: Diagnosis present

## 2023-12-04 DIAGNOSIS — C7951 Secondary malignant neoplasm of bone: Secondary | ICD-10-CM | POA: Insufficient documentation

## 2023-12-04 DIAGNOSIS — C50812 Malignant neoplasm of overlapping sites of left female breast: Secondary | ICD-10-CM | POA: Insufficient documentation

## 2023-12-04 MED ORDER — SODIUM CHLORIDE 0.9 % IV SOLN: Freq: Once | INTRAVENOUS | Status: AC

## 2023-12-04 MED ORDER — ZOLEDRONIC ACID 4 MG/100ML IV SOLN
4.0000 mg | Freq: Once | INTRAVENOUS | Status: AC
  Administered 2023-12-04: 4 mg via INTRAVENOUS
  Filled 2023-12-04: qty 100

## 2023-12-04 NOTE — Patient Instructions (Signed)

## 2024-01-11 ENCOUNTER — Other Ambulatory Visit: Payer: Self-pay | Admitting: Nurse Practitioner

## 2024-01-16 ENCOUNTER — Other Ambulatory Visit (HOSPITAL_COMMUNITY): Payer: Self-pay

## 2024-01-21 ENCOUNTER — Other Ambulatory Visit (HOSPITAL_COMMUNITY): Payer: Self-pay

## 2024-02-04 ENCOUNTER — Other Ambulatory Visit: Payer: Self-pay | Admitting: Hematology

## 2024-02-10 ENCOUNTER — Other Ambulatory Visit: Payer: Self-pay | Admitting: Nurse Practitioner

## 2024-02-19 ENCOUNTER — Other Ambulatory Visit: Payer: Self-pay | Admitting: Hematology

## 2024-02-25 NOTE — Assessment & Plan Note (Addendum)
 Stage IV, ([id number]), ER+/PR+/HER2 low expression, Grade 2, with bone metastasis  -Patient presented with a palpable left breast mass and enlarged left axillary lymph nodes in April 2024.  Previous mammogram 1 year ago was negative. -I reviewed her breast and left axillary lymph node biopsy, both showed grade 2 invasive ductal carcinoma, ER 100% and PR 20% strongly positive, HER2 IHC 2+, FISH negative. -I personally reviewed her PET scan images with patient and her husband, it showed hypermetabolic left breast, left axilla and subpectoral nodes, and multiple spinal bone mets.  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   -Due to her metastatic disease, upfront breast surgery is not recommended.  Patient was seen by breast surgeon [name]  -I recommend first-line systemic therapy with aromatase inhibitor and CDK 4/6 inhibitor, anastrozole  and Verzenio , she started in June 2024.  Due to significant fatigue and diarrhea, Verzenio  was dose reduced to 50 mg twice daily.  She has been tolerating low dose well overall  -restaging PET in 10/2023 showed good response

## 2024-02-26 ENCOUNTER — Inpatient Hospital Stay (HOSPITAL_BASED_OUTPATIENT_CLINIC_OR_DEPARTMENT_OTHER): Payer: PPO | Admitting: Hematology

## 2024-02-26 ENCOUNTER — Inpatient Hospital Stay: Payer: PPO | Attending: Hematology and Oncology

## 2024-02-26 ENCOUNTER — Inpatient Hospital Stay: Payer: PPO

## 2024-02-26 ENCOUNTER — Encounter: Payer: Self-pay | Admitting: Hematology

## 2024-02-26 ENCOUNTER — Other Ambulatory Visit: Payer: Self-pay | Admitting: Hematology

## 2024-02-26 VITALS — BP 138/70 | HR 90 | Temp 98.2°F | Resp 16 | Ht 62.0 in | Wt 156.9 lb

## 2024-02-26 DIAGNOSIS — C7951 Secondary malignant neoplasm of bone: Secondary | ICD-10-CM

## 2024-02-26 DIAGNOSIS — Z1321 Encounter for screening for nutritional disorder: Secondary | ICD-10-CM

## 2024-02-26 DIAGNOSIS — Z170 Estrogen receptor positive status [ER+]: Secondary | ICD-10-CM | POA: Diagnosis present

## 2024-02-26 DIAGNOSIS — C50912 Malignant neoplasm of unspecified site of left female breast: Secondary | ICD-10-CM | POA: Diagnosis not present

## 2024-02-26 DIAGNOSIS — C50812 Malignant neoplasm of overlapping sites of left female breast: Secondary | ICD-10-CM | POA: Insufficient documentation

## 2024-02-26 DIAGNOSIS — Z7983 Long term (current) use of bisphosphonates: Secondary | ICD-10-CM | POA: Diagnosis not present

## 2024-02-26 DIAGNOSIS — C773 Secondary and unspecified malignant neoplasm of axilla and upper limb lymph nodes: Secondary | ICD-10-CM

## 2024-02-26 LAB — CBC WITH DIFFERENTIAL (CANCER CENTER ONLY)
Abs Immature Granulocytes: 0.04 10*3/uL (ref 0.00–0.07)
Basophils Absolute: 0.1 10*3/uL (ref 0.0–0.1)
Basophils Relative: 0 %
Eosinophils Absolute: 0.4 10*3/uL (ref 0.0–0.5)
Eosinophils Relative: 1 %
HCT: 37.6 % (ref 36.0–46.0)
Hemoglobin: 12.3 g/dL (ref 12.0–15.0)
Immature Granulocytes: 0 %
Lymphocytes Relative: 88 %
Lymphs Abs: 30.3 10*3/uL — ABNORMAL HIGH (ref 0.7–4.0)
MCH: 31.3 pg (ref 26.0–34.0)
MCHC: 32.7 g/dL (ref 30.0–36.0)
MCV: 95.7 fL (ref 80.0–100.0)
Monocytes Absolute: 0.3 10*3/uL (ref 0.1–1.0)
Monocytes Relative: 1 %
Neutro Abs: 3.5 10*3/uL (ref 1.7–7.7)
Neutrophils Relative %: 10 %
Platelet Count: 231 10*3/uL (ref 150–400)
RBC: 3.93 MIL/uL (ref 3.87–5.11)
RDW: 13.5 % (ref 11.5–15.5)
Smear Review: NORMAL
WBC Count: 34.7 10*3/uL — ABNORMAL HIGH (ref 4.0–10.5)
nRBC: 0 % (ref 0.0–0.2)

## 2024-02-26 LAB — CMP (CANCER CENTER ONLY)
ALT: 21 U/L (ref 0–44)
AST: 19 U/L (ref 15–41)
Albumin: 4.2 g/dL (ref 3.5–5.0)
Alkaline Phosphatase: 87 U/L (ref 38–126)
Anion gap: 3 — ABNORMAL LOW (ref 5–15)
BUN: 12 mg/dL (ref 8–23)
CO2: 31 mmol/L (ref 22–32)
Calcium: 10.7 mg/dL — ABNORMAL HIGH (ref 8.9–10.3)
Chloride: 107 mmol/L (ref 98–111)
Creatinine: 0.71 mg/dL (ref 0.44–1.00)
GFR, Estimated: >60 mL/min (ref >60)
Glucose, Bld: 87 mg/dL (ref 70–99)
Potassium: 4.1 mmol/L (ref 3.5–5.1)
Sodium: 141 mmol/L (ref 135–145)
Total Bilirubin: 0.4 mg/dL (ref 0.0–1.2)
Total Protein: 6.4 g/dL — ABNORMAL LOW (ref 6.5–8.1)

## 2024-02-26 MED ORDER — SODIUM CHLORIDE 0.9 % IV SOLN: Freq: Once | INTRAVENOUS | Status: AC

## 2024-02-26 MED ORDER — ZOLEDRONIC ACID 4 MG/100ML IV SOLN
4.0000 mg | Freq: Once | INTRAVENOUS | Status: AC
  Administered 2024-02-26: 4 mg via INTRAVENOUS
  Filled 2024-02-26: qty 100

## 2024-02-26 NOTE — Progress Notes (Signed)
 [facility]   [phone]) [phone] Fax:([phone]   Clinic Follow up Note   [name], MD as PCP - General (Family Medicine) [name], MD as Attending Physician (Internal Medicine) [name], RN as Oncology Nurse Navigator [name], RN as Oncology Nurse Navigator [name], MD as Consulting Physician (Hematology)  Date of Service:  02/26/2024  CHIEF COMPLAINT: f/u of breast cancer  CURRENT THERAPY:  Anastrozole  and low-dose Verzenio   Oncology History   Breast cancer metastasized to axillary lymph node, left (HCC) Stage IV, ([id number]), ER+/PR+/HER2 low expression, Grade 2, with bone metastasis  -Patient presented with a palpable left breast mass and enlarged left axillary lymph nodes in April 2024.  Previous mammogram 1 year ago was negative. -I reviewed her breast and left axillary lymph node biopsy, both showed grade 2 invasive ductal carcinoma, ER 100% and PR 20% strongly positive, HER2 IHC 2+, FISH negative. -I personally reviewed her PET scan images with patient and her husband, it showed hypermetabolic left breast, left axilla and subpectoral nodes, and multiple spinal bone mets.  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   -Due to her metastatic disease, upfront breast surgery is not recommended.  Patient was seen by breast surgeon [name]  -I recommend first-line systemic therapy with aromatase inhibitor and CDK 4/6 inhibitor, anastrozole  and Verzenio , she started in June 2024.  Due to significant fatigue and diarrhea, Verzenio  was dose reduced to 50 mg twice daily.  She has been tolerating low dose well overall  -restaging PET in 10/2023 showed good response   Assessment & Plan Metastatic breast cancer Metastatic breast cancer with a favorable response to the current low-dose Verzenio  (abemaciclib ) regimen. Previous higher doses caused diarrhea. The  latest PET scan showed decreased activity with no progression. She tolerates the current dose well and reports improved well-being. If mild progression is observed in the next scan, consider switching to Ibrance (palbociclib) due to its lower incidence of diarrhea, despite being less potent than Verzenio . - Continue current low dose of Verzenio  (abemaciclib ). - Schedule PET scan in early August, one week before the follow-up appointment. - Continue Zometa  infusion every three months, with the next infusion in September.  Chronic lymphocytic leukemia (CLL) Chronic lymphocytic leukemia with improved white blood cell count, decreasing from 156 to 34, likely due to resolution of inflammation and diarrhea from higher Verzenio  doses.  Kidney stones Bilateral kidney stones present, asymptomatic, and well-managed with hydration. - Encourage continued hydration.   Plan - She is tolerating low-dose Verzenio  much better, and overall feel well - Continue anastrozole  and Verzenio  50 mg daily - Proceed with Zometa  infusion today and continue every 3 months - Follow-up in 2 months with lab and restaging PET scan 1 week before  SUMMARY OF ONCOLOGIC HISTORY: Oncology History Overview Note   Cancer Staging  Breast cancer metastasized to axillary lymph node, left (HCC) Staging form: Breast, AJCC 8th Edition - Clinical stage from 02/01/2023: Stage IV (cT2, cN1(f), cM1, G2, ER+, PR+, HER2: Equivocal) - Signed by [name], MD on 02/08/2023 Stage prefix: Initial diagnosis Method of lymph node assessment: Fine needle aspiration Histologic grading system: 3 grade system  CLL (chronic lymphocytic leukemia) (HCC) Staging form: Chronic Lymphocytic Leukemia / Small Lymphocytic Lymphoma, AJCC 8th Edition - Clinical stage from 08/24/2021: Modified Rai Stage I (Modified Rai risk: Intermediate, Lymphocytosis: Present, Adenopathy: Present, Organomegaly: Absent, Anemia: Absent, Thrombocytopenia: Absent) - Signed by  [name],  MD on 09/03/2022 Stage prefix: Initial diagnosis     CLL (chronic lymphocytic leukemia) (HCC)  06/12/2012 Initial Diagnosis   CLL (chronic lymphocytic leukemia)   07/20/2014 Pathology Results   FISH was normal   08/24/2021 Cancer Staging   Staging form: Chronic Lymphocytic Leukemia / Small Lymphocytic Lymphoma, AJCC 8th Edition - Clinical stage from 08/24/2021: Modified Rai Stage I (Modified Rai risk: Intermediate, Lymphocytosis: Present, Adenopathy: Present, Organomegaly: Absent, Anemia: Absent, Thrombocytopenia: Absent) - Signed by [name], MD on 09/03/2022 Stage prefix: Initial diagnosis   09/03/2022 Imaging   1. Isolated enlarged 16 mm short axis left axillary node. No other lymphadenopathy in the chest, abdomen, or pelvis. 2. 2.1 cm right thyroid  nodule. Recommend thyroid  US  (ref: J Am Coll Radiol. 2015 Feb;12(2): 143-50). 3. Tiny bilateral nonobstructing renal stones.   03/18/2023 Genetic Testing   Negative genetic testing on the CancerNext-Expanded panel test.  The report date is March 18, 2023.  The CancerNext-Expanded gene panel offered by [employer] and includes sequencing and rearrangement analysis for the following 71 genes: AIP, ALK, APC, ATM, BAP1, BARD1, BMPR1A, BRCA1, BRCA2, BRIP1, CDC73, CDH1, CDK4, CDKN1B, CDKN2A, CHEK2, DICER1, FH, FLCN, KIF1B, LZTR1, MAX, MEN1, MET, MLH1, MSH2, MSH6, MUTYH, NF1, NF2, NTHL1, PALB2, PHOX2B, PMS2, POT1, PRKAR1A, PTCH1, PTEN, RAD51C, RAD51D, RB1, RET, SDHA, SDHAF2, SDHB, SDHC, SDHD, SMAD4, SMARCA4, SMARCB1, SMARCE1, STK11, SUFU, TMEM127, TP53, TSC1, TSC2 and VHL (sequencing and deletion/duplication); AXIN2, CTNNA1, EGFR, EGLN1, HOXB13, KIT, MITF, MSH3, PDGFRA, POLD1 and POLE (sequencing only); EPCAM and GREM1 (deletion/duplication only). RNA data is routinely analyzed for use in variant interpretation for all genes.    Breast cancer metastasized to axillary lymph node, left (HCC)  01/23/2023 Imaging   IMPRESSION: 1. 2.3 cm  mass in the 3 o'clock position of the left breast with imaging features highly suspicious for primary breast cancer. 2. 3.1 cm markedly enlarged left axillary lymph node most likely representing a metastatic node associated with the patient's probable primary breast cancer. A single enlarged lymph node associated with the patient's chronic lymphocytic leukemia is less likely.   RECOMMENDATION: 1. Ultrasound-guided core needle biopsy of the 2.3 cm mass in the 3 o'clock position of the left breast. 2. Ultrasound-guided core needle biopsy of the 3.1 cm enlarged left axillary lymph node. This has been discussed with the patient and her husband and the biopsies have been scheduled at 12:45 p.m. on 01/29/2023.   01/29/2023 Pathology Results   Patient: Alphonzo Jenkins Collected: 01/29/2023 Client: [facility] Accession: [id number] Received: 01/29/2023 Amanda Jungling, MD DOB: September 25, 1950 Age: 70 Gender: F Reported: 01/30/2023 [address] Patient Ph: [phone] MRN #: [id number] [place], [place] [address] Client Acc#: Chart #: [id number] Phone: [phone] Fax: CC: [facility] CC CC: Jimmey Mould REPORT OF SURGICAL PATHOLOGY Addendum: Breast Biomarker Results FINAL DIAGNOSIS Diagnosis 1. Breast, left, needle core biopsy, 3 o'clock, mass, coil clip - INVASIVE MAMMARY CARCINOMA, SEE NOTE - MAMMARY CARCINOMA IN SITU, INTERMEDIATE GRADE - TUBULE FORMATION: SCORE 3 - NUCLEAR PLEOMORPHISM: SCORE 3 - MITOTIC COUNT: SCORE 1 - TOTAL SCORE: 7 - OVERALL GRADE: 2 - LYMPHOVASCULAR INVASION: NOT IDENTIFIED - CANCER LENGTH: 1.3 CM - CALCIFICATIONS: PRESENT, RARE - OTHER FINDINGS: INTRADUCTAL PAPILLOMA WITH ASSOCIATED MICROCALCIFICATION 2. Lymph node, needle/core biopsy, axilla, lymph node, hydromark clip - METASTATIC CARCINOMA - GREATEST DIMENSION: 1.2 CM - NO DEFINITIVE LYMPHOID TISSUE IDENTIFIED - SEE COMMENT Diagnosis Note 1. -2. Dr. Portia Brittle reviewed the case and concurs  with the interpretation. Immunohistochemical staining for  E-cadherin and a breast prognostic profile (ER, PR, Ki-67 and HER2) on block 1A is pending and will be reported in an addendum. 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  1. Breast, left, needle core biopsy, 3 o'clock, mass, coil clip PROGNOSTIC INDICATORS Results: IMMUNOHISTOCHEMICAL AND MORPHOMETRIC ANALYSIS PERFORMED MANUALLY The tumor cells are EQUIVOCAL for Her2 (2+). Her2 by FISH will be performed and the results reported separately. Estrogen Receptor: 100%, POSITIVE, STRONG STAINING INTENSITY Progesterone Receptor: 20%, POSITIVE, STRONG STAINING INTENSITY Proliferation Marker Ki67: 45%    02/01/2023 Initial Diagnosis   Breast cancer metastasized to axillary lymph node, left (HCC)   02/01/2023 Cancer Staging   Staging form: Breast, AJCC 8th Edition - Clinical stage from 02/01/2023: Stage IV (cT2, cN1(f), cM1, G2, ER+, PR+, HER2: Equivocal) - Signed by Sonja [place], MD on 02/08/2023 Stage prefix: Initial diagnosis Method of lymph node assessment: Fine needle aspiration Histologic grading system: 3 grade system   02/07/2023 PET scan   1. Hypermetabolic left breast mass consistent with known primary breast malignancy. 2. Hypermetabolic nodal metastases in the left axilla and subpectoral regions. 3. No evidence of metastatic disease within the abdomen or pelvis. 4. Several hypermetabolic osseous lesions in the thoracolumbar spine and right ribs, consistent with osseous metastatic disease. 5. Nonobstructing bilateral renal calculi.   03/18/2023 Genetic Testing   Negative genetic testing on the CancerNext-Expanded panel test.  The report date is March 18, 2023.  The CancerNext-Expanded gene panel offered by [employer] and includes sequencing and rearrangement analysis for the following 71 genes: AIP, ALK, APC, ATM, BAP1, BARD1, BMPR1A, BRCA1, BRCA2, BRIP1, CDC73, CDH1, CDK4, CDKN1B, CDKN2A,  CHEK2, DICER1, FH, FLCN, KIF1B, LZTR1, MAX, MEN1, MET, MLH1, MSH2, MSH6, MUTYH, NF1, NF2, NTHL1, PALB2, PHOX2B, PMS2, POT1, PRKAR1A, PTCH1, PTEN, RAD51C, RAD51D, RB1, RET, SDHA, SDHAF2, SDHB, SDHC, SDHD, SMAD4, SMARCA4, SMARCB1, SMARCE1, STK11, SUFU, TMEM127, TP53, TSC1, TSC2 and VHL (sequencing and deletion/duplication); AXIN2, CTNNA1, EGFR, EGLN1, HOXB13, KIT, MITF, MSH3, PDGFRA, POLD1 and POLE (sequencing only); EPCAM and GREM1 (deletion/duplication only). RNA data is routinely analyzed for use in variant interpretation for all genes.       Discussed the use of AI scribe software for clinical note transcription with the patient, who gave verbal consent to proceed.  History of Present Illness Kaitlin Barnes is a 73 year old female with metastatic breast cancer who presents for follow-up.  She is on a low dose of Verzenio , which she tolerates well, and Zometa  every three months. Her last PET scan in February showed decreased activity with no progression, and she is scheduled for another scan in August.  Her white blood cell count has decreased from 156 to 34 over the past eight months, which she attributes partly to the side effects of Verzenio , including diarrhea and inflammation. She experiences regular bowel movements without pain or diarrhea.  She has stopped consuming alcohol and manages her dairy intake with lactase tablets and lactose-free milk. She takes vitamin D  2000 IU daily and is pleased with the improvement in her white blood cell count.     All other systems were reviewed with the patient and are negative.  MEDICAL HISTORY:  Past Medical History:  Diagnosis Date   CLL (chronic lymphoblastic leukemia)    CLL (chronic lymphocytic leukemia) (HCC) 06/12/2012   Family history of breast cancer    Family history of ovarian cancer    Family history of prostate cancer    Fever 03/28/2016   Hypercalcemia    Hyperparathyroidism, unspecified (  HCC)    Hypothyroidism    Lung nodule  07/16/2013    SURGICAL HISTORY: Past Surgical History:  Procedure Laterality Date   BREAST BIOPSY Left    benign   BREAST BIOPSY Left 01/29/2023   times 2   BREAST BIOPSY Left 01/29/2023   US  LT BREAST BX W LOC DEV 1ST LESION IMG BX SPEC US  GUIDE 01/29/2023 [facility] MAMMOGRAPHY   IR FLUORO GUIDED NEEDLE PLC ASPIRATION/INJECTION LOC  02/14/2023   KNEE ARTHROSCOPY Left    THYROIDECTOMY      I have reviewed the social history and family history with the patient and they are unchanged from previous note.  ALLERGIES:  is allergic to tizanidine.  MEDICATIONS:  Current Outpatient Medications  Medication Sig Dispense Refill   ALPRAZolam  (XANAX ) 0.25 MG tablet Take 1 tablet (0.25 mg total) by mouth at bedtime as needed for anxiety. 30 tablet 0   anastrozole  (ARIMIDEX ) 1 MG tablet Take 1 tablet (1 mg total) by mouth daily. 90 tablet 3   Ascorbic Acid (VITAMIN C) 1000 MG tablet Take 1,000 mg by mouth daily.     cholecalciferol (VITAMIN D ) 1000 UNITS tablet Take 2,000 Units by mouth daily.     levothyroxine (SYNTHROID) 75 MCG tablet Take 75 mcg by mouth daily. 75mcg 5 days a week  100mcg 2 days per week     Multiple Vitamin (MULTIVITAMIN) tablet Take 1 tablet by mouth daily.     omeprazole  (PRILOSEC) 40 MG capsule Take 1 capsule by mouth once daily 30 capsule 0   ondansetron  (ZOFRAN ) 8 MG tablet Take 1 tablet (8 mg total) by mouth every 8 (eight) hours as needed for nausea or vomiting. 45 tablet 1   VERZENIO  50 MG tablet TAKE 1 TABLET BY MOUTH TWICE DAILY 56 tablet 0   Biotin 5000 MCG TABS Take 5,000 mcg by mouth daily. (Patient not taking: Reported on 02/26/2024)     No current facility-administered medications for this visit.    PHYSICAL EXAMINATION: ECOG PERFORMANCE STATUS: 1 - Symptomatic but completely ambulatory  Vitals:   02/26/24 0910  BP: 138/70  Pulse: 90  Resp: 16  Temp: 98.2 F (36.8 C)  SpO2: 96%   Wt Readings from Last 3 Encounters:  02/26/24 156 lb 14.4 oz (71.2 kg)   12/04/23 157 lb 8 oz (71.4 kg)  11/20/23 156 lb 6.4 oz (70.9 kg)     GENERAL:alert, no distress and comfortable SKIN: skin color, texture, turgor are normal, no rashes or significant lesions EYES: normal, Conjunctiva are pink and non-injected, sclera clear NECK: supple, thyroid  normal size, non-tender, without nodularity LYMPH:  no palpable lymphadenopathy in the cervical, axillary  LUNGS: clear to auscultation and percussion with normal breathing effort HEART: regular rate & rhythm and no murmurs and no lower extremity edema ABDOMEN:abdomen soft, non-tender and normal bowel sounds Musculoskeletal:no cyanosis of digits and no clubbing  NEURO: alert & oriented x 3 with fluent speech, no focal motor/sensory deficits  Physical Exam    LABORATORY DATA:  I have reviewed the data as listed    Latest Ref Rng & Units 02/26/2024    8:48 AM 11/20/2023    9:04 AM 10/23/2023    9:43 AM  CBC  WBC 4.0 - 10.5 K/uL 34.7  38.9  37.9   Hemoglobin 12.0 - 15.0 g/dL 16.1  09.6  04.5   Hematocrit 36.0 - 46.0 % 37.6  39.0  39.1   Platelets 150 - 400 K/uL 231  230  246  Latest Ref Rng & Units 02/26/2024    8:48 AM 11/20/2023    9:04 AM 10/23/2023    9:43 AM  CMP  Glucose 70 - 99 mg/dL 87  81  90   BUN 8 - 23 mg/dL 12  18  18    Creatinine 0.44 - 1.00 mg/dL 4.09  8.11  9.14   Sodium 135 - 145 mmol/L 141  142  140   Potassium 3.5 - 5.1 mmol/L 4.1  4.1  4.4   [place] 98 - 111 mmol/L 107  109  105   CO2 22 - 32 mmol/L 31  29  32   Calcium 8.9 - 10.3 mg/dL 78.2  95.6  21.3   Total Protein 6.5 - 8.1 g/dL 6.4  6.5  6.6   Total Bilirubin 0.0 - 1.2 mg/dL 0.4  0.4  0.4   Alkaline Phos 38 - 126 U/L 87  78  67   AST 15 - 41 U/L 19  18  16    ALT 0 - 44 U/L 21  19  16        RADIOGRAPHIC STUDIES: I have personally reviewed the radiological images as listed and agreed with the findings in the report. No results found.    Orders Placed This Encounter  Procedures   NM PET Image Restag (PS)  Skull Base To Thigh    Standing Status:   Future    Expected Date:   05/03/2024    Expiration Date:   02/25/2025    If indicated for the ordered procedure, I authorize the administration of a radiopharmaceutical per Radiology protocol:   Yes    Preferred imaging location?:   [facility]   Vitamin D  25 hydroxy    Standing Status:   Future    Expected Date:   05/28/2024    Expiration Date:   02/25/2025   All questions were answered. The patient knows to call the clinic with any problems, questions or concerns. No barriers to learning was detected. The total time spent in the appointment was 30 minutes, including review of chart and various tests results, discussions about plan of care and coordination of care plan     Sonja Mills, MD 02/26/2024

## 2024-02-26 NOTE — Patient Instructions (Signed)

## 2024-03-02 ENCOUNTER — Other Ambulatory Visit: Payer: Self-pay

## 2024-03-13 ENCOUNTER — Other Ambulatory Visit: Payer: Self-pay | Admitting: Nurse Practitioner

## 2024-03-20 ENCOUNTER — Other Ambulatory Visit: Payer: Self-pay | Admitting: Hematology

## 2024-03-25 ENCOUNTER — Other Ambulatory Visit (HOSPITAL_COMMUNITY): Payer: Self-pay

## 2024-04-14 ENCOUNTER — Other Ambulatory Visit: Payer: Self-pay | Admitting: Hematology

## 2024-04-14 ENCOUNTER — Other Ambulatory Visit: Payer: Self-pay | Admitting: Nurse Practitioner

## 2024-04-29 ENCOUNTER — Other Ambulatory Visit (HOSPITAL_COMMUNITY): Payer: Self-pay

## 2024-04-29 ENCOUNTER — Telehealth: Payer: Self-pay

## 2024-04-29 NOTE — Telephone Encounter (Signed)
 Oral Oncology Patient Advocate Encounter  Patient called in requesting an itemized cost statement for the month of July 2025 for her Verzenio  only. I have no documentation to provide on this  as her medication is supplied through a PAP( [employer]) that  allows her to receive the medication at no charge. She has been instructed to contact [employer] at P#  [phone] for further questions or any documentation regarding the processing/delivery  of the medication.   Charlott Hamilton,  CPhT-Adv  she/her/hers [facility]  [facility] Pharmacy Technician Patient Advocate Specialist III [facility] Phone: [phone]  Fax: [phone] [email or web address]

## 2024-05-04 ENCOUNTER — Ambulatory Visit (HOSPITAL_COMMUNITY)
Admission: RE | Admit: 2024-05-04 | Discharge: 2024-05-04 | Disposition: A | Discharge status: Home / Self Care | Source: Ambulatory Visit | Attending: Hematology | Admitting: Hematology

## 2024-05-04 DIAGNOSIS — C773 Secondary and unspecified malignant neoplasm of axilla and upper limb lymph nodes: Secondary | ICD-10-CM | POA: Insufficient documentation

## 2024-05-04 DIAGNOSIS — C50912 Malignant neoplasm of unspecified site of left female breast: Secondary | ICD-10-CM | POA: Insufficient documentation

## 2024-05-04 LAB — GLUCOSE, CAPILLARY: Glucose-Capillary: 95 mg/dL (ref 70–99)

## 2024-05-04 MED ORDER — FLUDEOXYGLUCOSE F - 18 (FDG) INJECTION
7.7000 | Freq: Once | INTRAVENOUS | Status: AC | PRN
  Administered 2024-05-04 (×2): 7.58 via INTRAVENOUS

## 2024-05-05 ENCOUNTER — Other Ambulatory Visit: Payer: Self-pay

## 2024-05-05 DIAGNOSIS — C50912 Malignant neoplasm of unspecified site of left female breast: Secondary | ICD-10-CM

## 2024-05-06 ENCOUNTER — Inpatient Hospital Stay: Attending: Hematology and Oncology

## 2024-05-06 DIAGNOSIS — C9110 Chronic lymphocytic leukemia of B-cell type not having achieved remission: Secondary | ICD-10-CM | POA: Diagnosis not present

## 2024-05-06 DIAGNOSIS — Z803 Family history of malignant neoplasm of breast: Secondary | ICD-10-CM | POA: Diagnosis not present

## 2024-05-06 DIAGNOSIS — R5383 Other fatigue: Secondary | ICD-10-CM | POA: Insufficient documentation

## 2024-05-06 DIAGNOSIS — C773 Secondary and unspecified malignant neoplasm of axilla and upper limb lymph nodes: Secondary | ICD-10-CM | POA: Insufficient documentation

## 2024-05-06 DIAGNOSIS — Z79899 Other long term (current) drug therapy: Secondary | ICD-10-CM | POA: Diagnosis not present

## 2024-05-06 DIAGNOSIS — C7951 Secondary malignant neoplasm of bone: Secondary | ICD-10-CM | POA: Diagnosis present

## 2024-05-06 DIAGNOSIS — Z170 Estrogen receptor positive status [ER+]: Secondary | ICD-10-CM | POA: Diagnosis not present

## 2024-05-06 DIAGNOSIS — C50812 Malignant neoplasm of overlapping sites of left female breast: Secondary | ICD-10-CM | POA: Insufficient documentation

## 2024-05-06 DIAGNOSIS — Z8041 Family history of malignant neoplasm of ovary: Secondary | ICD-10-CM | POA: Diagnosis not present

## 2024-05-06 DIAGNOSIS — C50912 Malignant neoplasm of unspecified site of left female breast: Secondary | ICD-10-CM

## 2024-05-06 DIAGNOSIS — Z79811 Long term (current) use of aromatase inhibitors: Secondary | ICD-10-CM | POA: Insufficient documentation

## 2024-05-06 LAB — CBC WITH DIFFERENTIAL (CANCER CENTER ONLY)
Abs Immature Granulocytes: 0.04 K/uL (ref 0.00–0.07)
Basophils Absolute: 0.1 K/uL (ref 0.0–0.1)
Basophils Relative: 0 %
Eosinophils Absolute: 0.1 K/uL (ref 0.0–0.5)
Eosinophils Relative: 0 %
HCT: 37.8 % (ref 36.0–46.0)
Hemoglobin: 12.3 g/dL (ref 12.0–15.0)
Immature Granulocytes: 0 %
Lymphocytes Relative: 90 %
Lymphs Abs: 32.5 K/uL — ABNORMAL HIGH (ref 0.7–4.0)
MCH: 31.4 pg (ref 26.0–34.0)
MCHC: 32.5 g/dL (ref 30.0–36.0)
MCV: 96.4 fL (ref 80.0–100.0)
Monocytes Absolute: 0.4 K/uL (ref 0.1–1.0)
Monocytes Relative: 1 %
Neutro Abs: 3.2 K/uL (ref 1.7–7.7)
Neutrophils Relative %: 9 %
Platelet Count: 207 K/uL (ref 150–400)
RBC: 3.92 MIL/uL (ref 3.87–5.11)
RDW: 13.8 % (ref 11.5–15.5)
WBC Count: 36.3 K/uL — ABNORMAL HIGH (ref 4.0–10.5)
nRBC: 0 % (ref 0.0–0.2)

## 2024-05-06 LAB — CMP (CANCER CENTER ONLY)
ALT: 20 U/L (ref 0–44)
AST: 18 U/L (ref 15–41)
Albumin: 4.2 g/dL (ref 3.5–5.0)
Alkaline Phosphatase: 78 U/L (ref 38–126)
Anion gap: 4 — ABNORMAL LOW (ref 5–15)
BUN: 19 mg/dL (ref 8–23)
CO2: 30 mmol/L (ref 22–32)
Calcium: 10.3 mg/dL (ref 8.9–10.3)
Chloride: 107 mmol/L (ref 98–111)
Creatinine: 0.81 mg/dL (ref 0.44–1.00)
GFR, Estimated: >60 mL/min (ref >60)
Glucose, Bld: 74 mg/dL (ref 70–99)
Potassium: 4.2 mmol/L (ref 3.5–5.1)
Sodium: 141 mmol/L (ref 135–145)
Total Bilirubin: 0.3 mg/dL (ref 0.0–1.2)
Total Protein: 6.2 g/dL — ABNORMAL LOW (ref 6.5–8.1)

## 2024-05-08 ENCOUNTER — Other Ambulatory Visit: Payer: Self-pay | Admitting: Hematology

## 2024-05-13 ENCOUNTER — Encounter: Payer: Self-pay | Admitting: Hematology

## 2024-05-13 ENCOUNTER — Ambulatory Visit (HOSPITAL_BASED_OUTPATIENT_CLINIC_OR_DEPARTMENT_OTHER): Admitting: Hematology

## 2024-05-13 VITALS — BP 153/68 | HR 72 | Temp 98.2°F | Resp 19 | Ht 62.0 in | Wt 157.4 lb

## 2024-05-13 DIAGNOSIS — C9110 Chronic lymphocytic leukemia of B-cell type not having achieved remission: Secondary | ICD-10-CM

## 2024-05-13 DIAGNOSIS — C773 Secondary and unspecified malignant neoplasm of axilla and upper limb lymph nodes: Secondary | ICD-10-CM | POA: Diagnosis not present

## 2024-05-13 DIAGNOSIS — C50812 Malignant neoplasm of overlapping sites of left female breast: Secondary | ICD-10-CM | POA: Diagnosis not present

## 2024-05-13 DIAGNOSIS — C50912 Malignant neoplasm of unspecified site of left female breast: Secondary | ICD-10-CM

## 2024-05-13 NOTE — Assessment & Plan Note (Signed)
 -  stage 0, no indication for therapy -Will continue new monitoring CBC

## 2024-05-13 NOTE — Assessment & Plan Note (Signed)
 Stage IV, ([id number]), ER+/PR+/HER2 low expression, Grade 2, with bone metastasis  -Patient presented with a palpable left breast mass and enlarged left axillary lymph nodes in April 2024.  Previous mammogram 1 year ago was negative. -I reviewed her breast and left axillary lymph node biopsy, both showed grade 2 invasive ductal carcinoma, ER 100% and PR 20% strongly positive, HER2 IHC 2+, FISH negative. -I personally reviewed her PET scan images with patient and her husband, it showed hypermetabolic left breast, left axilla and subpectoral nodes, and multiple spinal bone mets.  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   -Due to her metastatic disease, upfront breast surgery is not recommended.  Patient was seen by breast surgeon [name]  -I recommend first-line systemic therapy with aromatase inhibitor and CDK 4/6 inhibitor, anastrozole  and Verzenio , she started in June 2024.  Due to significant fatigue and diarrhea, Verzenio  was dose reduced to 50 mg twice daily.  She has been tolerating low dose well overall  -restaging PET in 10/2023 showed good response

## 2024-05-13 NOTE — Progress Notes (Signed)
 [facility]   [phone] [phone]   Clinic Follow up Note   Patient Care Team: Kaitlin Carlin Redbird, MD as PCP - General (Family Medicine) Kaitlin Purchase, MD as Attending Physician (Internal Medicine) Kaitlin Stephane BROCKS, RN (Inactive) as Oncology Nurse Navigator Kaitlin Nanetta SAILOR, RN as Oncology Nurse Navigator Kaitlin Callander, MD as Consulting Physician (Hematology)  Date of Service:  05/13/2024  CHIEF COMPLAINT: f/u of breast cancer  CURRENT THERAPY:  Anastrozole  and Verzenio   Oncology History   Breast cancer metastasized to axillary lymph node, left (HCC) Stage IV, ([id number]), ER+/PR+/HER2 low expression, Grade 2, with bone metastasis  -Patient presented with a palpable left breast mass and enlarged left axillary lymph nodes in April 2024.  Previous mammogram 1 year ago was negative. -I reviewed her breast and left axillary lymph node biopsy, both showed grade 2 invasive ductal carcinoma, ER 100% and PR 20% strongly positive, HER2 IHC 2+, FISH negative. -I personally reviewed her PET scan images with patient and her husband, it showed hypermetabolic left breast, left axilla and subpectoral nodes, and multiple spinal bone mets.  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   -Due to her metastatic disease, upfront breast surgery is not recommended.  Patient was seen by breast surgeon Dr. Aron  -I recommend first-line systemic therapy with aromatase inhibitor and CDK 4/6 inhibitor, anastrozole  and Verzenio , she started in June 2024.  Due to significant fatigue and diarrhea, Verzenio  was dose reduced to 50 mg twice daily.  She has been tolerating low dose well overall  -restaging PET in 10/2023 showed good response   CLL (chronic lymphocytic leukemia) -stage 0, no indication for therapy -Will continue new monitoring CBC   Assessment & Plan Metastatic breast cancer with bone and lymph node  involvement Metastatic breast cancer with bone metastasis in the lower spine and lymph node involvement in the chest and axilla. Bone metastasis is well-controlled with no active disease on PET scan, though bone changes remain visible. The breast tumor shows slightly increased activity compared to the last scan, but lymph nodes appear improved. Current treatment is effective, and no changes are needed. Discussed potential for future disease progression and alternative treatments. She understands the long-term nature of treatment and possible future therapy changes. - Continue current treatment regimen. - Schedule PET scan in six months. - Administer Zemaira every six months to prevent fractures due to bone metastasis. - Monitor for disease progression and adjust treatment as necessary.  Chronic lymphocytic leukemia Chronic lymphocytic leukemia with high but non-worsening white blood cell count, indicating well-managed disease. She is aware of the condition and its implications. - Monitor white blood cell count.  Fatigue Intermittent fatigue, particularly post-exertion, possibly influenced by recent infusions and summer weather. Experiences heaviness in legs and hip pain during walking, limiting activity. Performs daily floor exercises but finds walking challenging. - Encourage splitting walks into shorter sessions, one in the morning and one in the afternoon. - Continue daily floor exercises.   Plan - PET scan reviewed, overall stable disease. - Continue anastrozole  and low-dose Verzenio , she is overall tolerating well. - Due to her side effect from Zometa , we will change it to every 6 months. - Lab, follow-up and Zometa  in 3 months.  SUMMARY OF ONCOLOGIC HISTORY: Oncology History Overview Note   Cancer Staging  Breast cancer metastasized to axillary lymph node, left (HCC) Staging form: Breast, AJCC 8th Edition - Clinical stage from 02/01/2023: Stage IV (cT2, cN1(f), cM1, G2,  ER+, PR+,  HER2: Equivocal) - Signed by Kaitlin Callander, MD on 02/08/2023 Stage prefix: Initial diagnosis Method of lymph node assessment: Fine needle aspiration Histologic grading system: 3 grade system  CLL (chronic lymphocytic leukemia) (HCC) Staging form: Chronic Lymphocytic Leukemia / Small Lymphocytic Lymphoma, AJCC 8th Edition - Clinical stage from 08/24/2021: Modified Rai Stage I (Modified Rai risk: Intermediate, Lymphocytosis: Present, Adenopathy: Present, Organomegaly: Absent, Anemia: Absent, Thrombocytopenia: Absent) - Signed by Kaitlin Hicks, MD on 09/03/2022 Stage prefix: Initial diagnosis     CLL (chronic lymphocytic leukemia) (HCC)  06/12/2012 Initial Diagnosis   CLL (chronic lymphocytic leukemia)   07/20/2014 Pathology Results   FISH was normal   08/24/2021 Cancer Staging   Staging form: Chronic Lymphocytic Leukemia / Small Lymphocytic Lymphoma, AJCC 8th Edition - Clinical stage from 08/24/2021: Modified Rai Stage I (Modified Rai risk: Intermediate, Lymphocytosis: Present, Adenopathy: Present, Organomegaly: Absent, Anemia: Absent, Thrombocytopenia: Absent) - Signed by Kaitlin Hicks, MD on 09/03/2022 Stage prefix: Initial diagnosis   09/03/2022 Imaging   1. Isolated enlarged 16 mm short axis left axillary node. No other lymphadenopathy in the chest, abdomen, or pelvis. 2. 2.1 cm right thyroid  nodule. Recommend thyroid  US  (ref: J Am Coll Radiol. 2015 Feb;12(2): 143-50). 3. Tiny bilateral nonobstructing renal stones.   03/18/2023 Genetic Testing   Negative genetic testing on the CancerNext-Expanded panel test.  The report date is March 18, 2023.  The CancerNext-Expanded gene panel offered by [employer] and includes sequencing and rearrangement analysis for the following 71 genes: AIP, ALK, APC, ATM, BAP1, BARD1, BMPR1A, BRCA1, BRCA2, BRIP1, CDC73, CDH1, CDK4, CDKN1B, CDKN2A, CHEK2, DICER1, FH, FLCN, KIF1B, LZTR1, MAX, MEN1, MET, MLH1, MSH2, MSH6, MUTYH, NF1, NF2, NTHL1, PALB2, PHOX2B, PMS2,  POT1, PRKAR1A, PTCH1, PTEN, RAD51C, RAD51D, RB1, RET, SDHA, SDHAF2, SDHB, SDHC, SDHD, SMAD4, SMARCA4, SMARCB1, SMARCE1, STK11, SUFU, TMEM127, TP53, TSC1, TSC2 and VHL (sequencing and deletion/duplication); AXIN2, CTNNA1, EGFR, EGLN1, HOXB13, KIT, MITF, MSH3, PDGFRA, POLD1 and POLE (sequencing only); EPCAM and GREM1 (deletion/duplication only). RNA data is routinely analyzed for use in variant interpretation for all genes.    Breast cancer metastasized to axillary lymph node, left (HCC)  01/23/2023 Imaging   IMPRESSION: 1. 2.3 cm mass in the 3 o'clock position of the left breast with imaging features highly suspicious for primary breast cancer. 2. 3.1 cm markedly enlarged left axillary lymph node most likely representing a metastatic node associated with the patient's probable primary breast cancer. A single enlarged lymph node associated with the patient's chronic lymphocytic leukemia is less likely.   RECOMMENDATION: 1. Ultrasound-guided core needle biopsy of the 2.3 cm mass in the 3 o'clock position of the left breast. 2. Ultrasound-guided core needle biopsy of the 3.1 cm enlarged left axillary lymph node. This has been discussed with the patient and her husband and the biopsies have been scheduled at 12:45 p.m. on 01/29/2023.   01/29/2023 Pathology Results   Patient: Kaitlin Barnes Collected: 01/29/2023 Client: [facility] Accession: [id number] Received: 01/29/2023 Alm Parkins, MD DOB: 01/23/1951 Age: 20 Gender: F Reported: 01/30/2023 [address] Patient Ph: [phone] MRN #: [id number] [place], [place] [address] Client Acc#: Chart #: [id number] Phone: [phone] Fax: CC: [facility] CC CC: Kaitlin Barnes REPORT OF SURGICAL PATHOLOGY Addendum: Breast Biomarker Results FINAL DIAGNOSIS Diagnosis 1. Breast, left, needle core biopsy, 3 o'clock, mass, coil clip - INVASIVE MAMMARY CARCINOMA, SEE NOTE - MAMMARY CARCINOMA IN SITU, INTERMEDIATE GRADE - TUBULE  FORMATION: SCORE 3 - NUCLEAR PLEOMORPHISM: SCORE 3 - MITOTIC  COUNT: SCORE 1 - TOTAL SCORE: 7 - OVERALL GRADE: 2 - LYMPHOVASCULAR INVASION: NOT IDENTIFIED - CANCER LENGTH: 1.3 CM - CALCIFICATIONS: PRESENT, RARE - OTHER FINDINGS: INTRADUCTAL PAPILLOMA WITH ASSOCIATED MICROCALCIFICATION 2. Lymph node, needle/core biopsy, axilla, lymph node, hydromark clip - METASTATIC CARCINOMA - GREATEST DIMENSION: 1.2 CM - NO DEFINITIVE LYMPHOID TISSUE IDENTIFIED - SEE COMMENT Diagnosis Note 1. -2. Dr. Belvie reviewed the case and concurs with the interpretation. Immunohistochemical staining for E-cadherin and a breast prognostic profile (ER, PR, Ki-67 and HER2) on block 1A is pending and will be reported in an addendum. 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  1. Breast, left, needle core biopsy, 3 o'clock, mass, coil clip PROGNOSTIC INDICATORS Results: IMMUNOHISTOCHEMICAL AND MORPHOMETRIC ANALYSIS PERFORMED MANUALLY The tumor cells are EQUIVOCAL for Her2 (2+). Her2 by FISH will be performed and the results reported separately. Estrogen Receptor: 100%, POSITIVE, STRONG STAINING INTENSITY Progesterone Receptor: 20%, POSITIVE, STRONG STAINING INTENSITY Proliferation Marker Ki67: 45%    02/01/2023 Initial Diagnosis   Breast cancer metastasized to axillary lymph node, left (HCC)   02/01/2023 Cancer Staging   Staging form: Breast, AJCC 8th Edition - Clinical stage from 02/01/2023: Stage IV (cT2, cN1(f), cM1, G2, ER+, PR+, HER2: Equivocal) - Signed by Kaitlin Callander, MD on 02/08/2023 Stage prefix: Initial diagnosis Method of lymph node assessment: Fine needle aspiration Histologic grading system: 3 grade system   02/07/2023 PET scan   1. Hypermetabolic left breast mass consistent with known primary breast malignancy. 2. Hypermetabolic nodal metastases in the left axilla and subpectoral regions. 3. No evidence of metastatic disease within the abdomen or pelvis. 4.  Several hypermetabolic osseous lesions in the thoracolumbar spine and right ribs, consistent with osseous metastatic disease. 5. Nonobstructing bilateral renal calculi.   03/18/2023 Genetic Testing   Negative genetic testing on the CancerNext-Expanded panel test.  The report date is March 18, 2023.  The CancerNext-Expanded gene panel offered by [employer] and includes sequencing and rearrangement analysis for the following 71 genes: AIP, ALK, APC, ATM, BAP1, BARD1, BMPR1A, BRCA1, BRCA2, BRIP1, CDC73, CDH1, CDK4, CDKN1B, CDKN2A, CHEK2, DICER1, FH, FLCN, KIF1B, LZTR1, MAX, MEN1, MET, MLH1, MSH2, MSH6, MUTYH, NF1, NF2, NTHL1, PALB2, PHOX2B, PMS2, POT1, PRKAR1A, PTCH1, PTEN, RAD51C, RAD51D, RB1, RET, SDHA, SDHAF2, SDHB, SDHC, SDHD, SMAD4, SMARCA4, SMARCB1, SMARCE1, STK11, SUFU, TMEM127, TP53, TSC1, TSC2 and VHL (sequencing and deletion/duplication); AXIN2, CTNNA1, EGFR, EGLN1, HOXB13, KIT, MITF, MSH3, PDGFRA, POLD1 and POLE (sequencing only); EPCAM and GREM1 (deletion/duplication only). RNA data is routinely analyzed for use in variant interpretation for all genes.       Discussed the use of AI scribe software for clinical note transcription with the patient, who gave verbal consent to proceed.  History of Present Illness Kaitlin Barnes is a 73 year old female with metastatic breast cancer who presents for follow-up.  She has metastatic breast cancer with bone metastasis in the lower spine and continues anastrozole  therapy without issues. She occasionally misses doses, not more than twice a month, and uses a pill box for medication management. Fatigue occurs particularly after infusions, but her weight remains stable and she maintains a good appetite. She experiences achiness after Zemaira infusions, managed with Advil. Her chronic lymphocytic leukemia is stable, with a high but not worsening white blood cell count. She does not take calcium supplements but takes vitamin D . Protein levels are slightly  below normal. She experiences heaviness in her legs and hip pain after walking a block and a half, and performs daily  floor exercises.     All other systems were reviewed with the patient and are negative.  MEDICAL HISTORY:  Past Medical History:  Diagnosis Date   CLL (chronic lymphoblastic leukemia)    CLL (chronic lymphocytic leukemia) (HCC) 06/12/2012   Family history of breast cancer    Family history of ovarian cancer    Family history of prostate cancer    Fever 03/28/2016   Hypercalcemia    Hyperparathyroidism, unspecified (HCC)    Hypothyroidism    Lung nodule 07/16/2013    SURGICAL HISTORY: Past Surgical History:  Procedure Laterality Date   BREAST BIOPSY Left    benign   BREAST BIOPSY Left 01/29/2023   times 2   BREAST BIOPSY Left 01/29/2023   US  LT BREAST BX W LOC DEV 1ST LESION IMG BX SPEC US  GUIDE 01/29/2023 [facility] MAMMOGRAPHY   IR FLUORO GUIDED NEEDLE PLC ASPIRATION/INJECTION LOC  02/14/2023   KNEE ARTHROSCOPY Left    THYROIDECTOMY      I have reviewed the social history and family history with the patient and they are unchanged from previous note.  ALLERGIES:  is allergic to tizanidine.  MEDICATIONS:  Current Outpatient Medications  Medication Sig Dispense Refill   ALPRAZolam  (XANAX ) 0.25 MG tablet Take 1 tablet (0.25 mg total) by mouth at bedtime as needed for anxiety. 30 tablet 0   anastrozole  (ARIMIDEX ) 1 MG tablet Take 1 tablet (1 mg total) by mouth daily. 90 tablet 3   Ascorbic Acid (VITAMIN C) 1000 MG tablet Take 1,000 mg by mouth daily.     cholecalciferol (VITAMIN D ) 1000 UNITS tablet Take 2,000 Units by mouth daily.     levothyroxine (SYNTHROID) 75 MCG tablet Take 75 mcg by mouth daily. 75mcg 5 days a week  100mcg 2 days per week     Multiple Vitamin (MULTIVITAMIN) tablet Take 1 tablet by mouth daily.     omeprazole  (PRILOSEC) 40 MG capsule Take 1 capsule by mouth once daily 30 capsule 0   ondansetron  (ZOFRAN ) 8 MG tablet Take 1 tablet (8 mg total)  by mouth every 8 (eight) hours as needed for nausea or vomiting. 45 tablet 1   VERZENIO  50 MG tablet TAKE 1 TABLET BY MOUTH TWICE DAILY 56 tablet 0   No current facility-administered medications for this visit.    PHYSICAL EXAMINATION: ECOG PERFORMANCE STATUS: 1 - Symptomatic but completely ambulatory  Vitals:   05/13/24 0904  BP: (!) 153/68  Pulse: 72  Resp: 19  Temp: 98.2 F (36.8 C)  SpO2: 99%   Wt Readings from Last 3 Encounters:  05/13/24 157 lb 6.4 oz (71.4 kg)  02/26/24 156 lb 14.4 oz (71.2 kg)  12/04/23 157 lb 8 oz (71.4 kg)     GENERAL:alert, no distress and comfortable SKIN: skin color, texture, turgor are normal, no rashes or significant lesions EYES: normal, Conjunctiva are pink and non-injected, sclera clear Musculoskeletal:no cyanosis of digits and no clubbing  NEURO: alert & oriented x 3 with fluent speech, no focal motor/sensory deficits  Physical Exam   LABORATORY DATA:  I have reviewed the data as listed    Latest Ref Rng & Units 05/06/2024    8:29 AM 02/26/2024    8:48 AM 11/20/2023    9:04 AM  CBC  WBC 4.0 - 10.5 K/uL 36.3  34.7  38.9   Hemoglobin 12.0 - 15.0 g/dL 87.6  87.6  87.3   Hematocrit 36.0 - 46.0 % 37.8  37.6  39.0   Platelets 150 - 400  K/uL 207  231  230         Latest Ref Rng & Units 05/06/2024    8:29 AM 02/26/2024    8:48 AM 11/20/2023    9:04 AM  CMP  Glucose 70 - 99 mg/dL 74  87  81   BUN 8 - 23 mg/dL 19  12  18    Creatinine 0.44 - 1.00 mg/dL 9.18  9.28  9.19   Sodium 135 - 145 mmol/L 141  141  142   Potassium 3.5 - 5.1 mmol/L 4.2  4.1  4.1   Chloride 98 - 111 mmol/L 107  107  109   CO2 22 - 32 mmol/L 30  31  29    Calcium 8.9 - 10.3 mg/dL 89.6  89.2  89.2   Total Protein 6.5 - 8.1 g/dL 6.2  6.4  6.5   Total Bilirubin 0.0 - 1.2 mg/dL 0.3  0.4  0.4   Alkaline Phos 38 - 126 U/L 78  87  78   AST 15 - 41 U/L 18  19  18    ALT 0 - 44 U/L 20  21  19        RADIOGRAPHIC STUDIES: I have personally reviewed the radiological images  as listed and agreed with the findings in the report. No results found.    No orders of the defined types were placed in this encounter.  All questions were answered. The patient knows to call the clinic with any problems, questions or concerns. No barriers to learning was detected. The total time spent in the appointment was 30 minutes, including review of chart and various tests results, discussions about plan of care and coordination of care plan     Onita Mattock, MD 05/13/2024

## 2024-05-13 NOTE — Addendum Note (Signed)
 Addended by: Portage Antolin P on: 05/13/2024 09:25 AM   Modules accepted: Orders

## 2024-05-16 ENCOUNTER — Other Ambulatory Visit: Payer: Self-pay | Admitting: Nurse Practitioner

## 2024-06-03 ENCOUNTER — Other Ambulatory Visit: Payer: Self-pay | Admitting: Hematology

## 2024-06-14 ENCOUNTER — Other Ambulatory Visit: Payer: Self-pay | Admitting: Nurse Practitioner

## 2024-07-01 ENCOUNTER — Other Ambulatory Visit: Payer: Self-pay

## 2024-07-01 NOTE — Progress Notes (Signed)
 Patient called in stating she is taking Verzenio , she said she was doing fine at first but now she is having diarrhea. She is taking Imodium the max dose on the box, I informed her that she can take double the dose after each bowel movement not to exceed 4 times a day. She also stated she is having a lot of fatigue. The diarrhea is happening every other day. She has a good day then a day of diarrhea, she stated it is worse after eating. She is taking in fluids water ginger ale, Gatorade to keep her hydrated. Informed patient to try the imodium for a couple of days if she sees no improvement to contact us  back for different instructions. I told her we could get her worked into our symptom management clinic. She had no further questions at this time.

## 2024-07-03 ENCOUNTER — Other Ambulatory Visit: Payer: Self-pay | Admitting: Hematology

## 2024-07-06 ENCOUNTER — Other Ambulatory Visit: Payer: Self-pay

## 2024-07-13 ENCOUNTER — Other Ambulatory Visit: Payer: Self-pay | Admitting: Nurse Practitioner

## 2024-07-30 ENCOUNTER — Other Ambulatory Visit: Payer: Self-pay | Admitting: Hematology

## 2024-08-05 ENCOUNTER — Ambulatory Visit

## 2024-08-05 ENCOUNTER — Ambulatory Visit: Admitting: Hematology

## 2024-08-05 ENCOUNTER — Other Ambulatory Visit

## 2024-08-12 ENCOUNTER — Other Ambulatory Visit: Payer: Self-pay | Admitting: Nurse Practitioner

## 2024-08-12 DIAGNOSIS — C50912 Malignant neoplasm of unspecified site of left female breast: Secondary | ICD-10-CM

## 2024-08-12 NOTE — Progress Notes (Unsigned)
 Patient Care Team: Okey Carlin Redbird, MD as PCP - General (Family Medicine) Faythe Purchase, MD as Attending Physician (Internal Medicine) Tyree Nanetta SAILOR, RN as Oncology Nurse Navigator Lanny Callander, MD as Consulting Physician (Hematology)  Clinic Day:  08/13/2024  Referring physician: Okey Carlin Redbird, MD  ASSESSMENT & PLAN:   Assessment & Plan: Breast cancer metastasized to axillary lymph node, left (HCC) Stage IV, ([id number]), ER+/PR+/HER2 low expression, Grade 2, with bone metastasis  -Patient presented with a palpable left breast mass and enlarged left axillary lymph nodes in April 2024.  Previous mammogram 1 year ago was negative. -I reviewed her breast and left axillary lymph node biopsy, both showed grade 2 invasive ductal carcinoma, ER 100% and PR 20% strongly positive, HER2 IHC 2+, FISH negative. -I personally reviewed her PET scan images with patient and her husband, it showed hypermetabolic left breast, left axilla and subpectoral nodes, and multiple spinal bone mets.  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   -Due to her metastatic disease, upfront breast surgery is not recommended.  Patient was seen by breast surgeon Dr. Aron  -I recommend first-line systemic therapy with aromatase inhibitor and CDK 4/6 inhibitor, anastrozole  and Verzenio , she started in June 2024.  Due to significant fatigue and diarrhea, Verzenio  was dose reduced to 50 mg twice daily.  She has been tolerating low dose well overall  -restaging PET in 10/2023 showed good response  - Continue anastrozole  and Verzenio  as prescribed. -Restaging PET/CT ordered for February 2026. -Zometa  infusion given 08/13/2024.  Treatment plan updated for every 6 months.  Will be due next in 01/2025. -Plan for labs and follow-up in 3 months or sooner if needed.  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 Will change Zometa  to every 6 months after this treatment. This was plan at last visit in 04/2024. Will change treatment plan to every 6 months after today's infusion. Patient reports severe bone and joint pain for several days after each Zometa  infusion. She is able to take ibuprofen to help with pain and this does relieve it to some degree.   CLL WBC count remains stable at 38.1. will continue to monitor closely.   Left breast cancer with mets (stage IV) Continues to take anastrozole  and Verzenio . Is on 50 mg verzenio  twice daily. Higher doses were causing her significant GI side effects. She does have hot flashes and night sweats which are tolerable. She can still feel the small lump in the outer, upper quadrant of her left breast. It is non tender. She has not noted any new changes, lumps, or masses.  She will be due for restaging PET scan in February 2026.  This was ordered as part of today's visit.  Difficulty sleeping Patient states she has recently had some trouble with pain.  Has been taking 1 Advil PM at night to help sleep.  She states she is concerned about taking all but ibuprofen.  She states back at initial diagnosis with breast cancer, she was given low-dose prescription for alprazolam  to use as needed.  States she only took once or twice, but felt very groggy the next day.  She was wondering if taking 1/2 tablet would be beneficial, rather than taking Advil PM.  She states she has nearly the entire bottle remaining.  Discussed this would be okay to try.  I did recommend she try taking  1/2 tablets to see if that helped without causing grogginess or hangover type feeling in the morning.  If this was not effective, recommended OTC Unisom which is the same diphenhydramine in Advil PM without the ibuprofen.  Plan Labs reviewed. - Stable CBC. - Mild elevation of Of calcium at 10.8. Surveillance PET scan due in February 2026.  This  was ordered as part of today's visit. Continue anastrozole  daily and Verzenio  twice daily. Zometa  infusion administered today. - Recommend ibuprofen and/or Tylenol to help manage pain. -- Will continue with Zometa  infusions every 6 months. Labs and follow-up in 3 months, sooner if needed.   The patient understands the plans discussed today and is in agreement with them.  She knows to contact our office if she develops concerns prior to her next appointment.  I provided 30 minutes of face-to-face time during this encounter and > 50% was spent counseling as documented under my assessment and plan.    [name], NP  [facility] - A DEPT OF [facility] [address] [place] [address] Dept: [phone] Dept Fax: [phone]   Orders Placed This Encounter  Procedures   NM PET Image Restag (PS) Skull Base To Thigh    Standing Status:   Future    Expected Date:   11/13/2024    Expiration Date:   08/13/2025    If indicated for the ordered procedure, I authorize the administration of a radiopharmaceutical per Radiology protocol:   Yes    Preferred imaging location?:   [facility]      CHIEF COMPLAINT:  CC: left breast cancer, ER +; CLL  Current Treatment:  anastrozole  and Verzenio  - Zometa  q 6 months  INTERVAL HISTORY:  Kaitlin Barnes is here today for repeat clinical assessment. She last saw Dr. Lanny on 05/13/2024. They discussed severe side effects that patient was having after each Zometa  infusion. They had agreed to make infusions every 6 months, but change was not carried over to treatment plan. Patient states that she would like to get it since she is already here. She contineus to take anastrozole  and Verzenio . Is on 50 mg verzenio  twice daily. Higher doses were causing her significant GI side effects. She does have hot flashes and night sweats which are tolerable. She can still feel the small lump in the outer,  upper quadrant of her left breast. It is non tender. She has not noted any new changes, lumps, masses. She denies chest pain, chest pressure, or shortness of breath. She denies headaches or visual disturbances. She denies abdominal pain, nausea, vomiting, or changes in bowel or bladder habits.  She denies fevers or chills. She denies pain. Her appetite is fair. She states that she has had to make some dietary changes. Specifically, has to really limit her dairy intake due to GI upset. Her weight has been stable.  I have reviewed the past medical history, past surgical history, social history and family history with the patient and they are unchanged from previous note.  ALLERGIES:  is allergic to tizanidine.  MEDICATIONS:  Current Outpatient Medications  Medication Sig Dispense Refill   ALPRAZolam  (XANAX ) 0.25 MG tablet Take 1 tablet (0.25 mg total) by mouth at bedtime as needed for anxiety. 30 tablet 0   anastrozole  (ARIMIDEX ) 1 MG tablet Take 1 tablet (1 mg total) by mouth daily. 90 tablet 3   Ascorbic Acid (VITAMIN C) 1000 MG tablet Take 1,000 mg  by mouth daily.     Bacillus Coagulans-Inulin (ALIGN PREBIOTIC-PROBIOTIC PO) Take 1 capsule by mouth daily.     cholecalciferol (VITAMIN D ) 1000 UNITS tablet Take 2,000 Units by mouth daily.     levothyroxine (SYNTHROID) 75 MCG tablet Take 75 mcg by mouth daily. 75mcg 4 days a week  37.5 mg 3 days per week.     Multiple Vitamin (MULTIVITAMIN) tablet Take 1 tablet by mouth daily.     omeprazole  (PRILOSEC) 40 MG capsule Take 1 capsule by mouth once daily 30 capsule 0   ondansetron  (ZOFRAN ) 8 MG tablet Take 1 tablet (8 mg total) by mouth every 8 (eight) hours as needed for nausea or vomiting. 45 tablet 1   VERZENIO  50 MG tablet TAKE 1 TABLET BY MOUTH TWICE DAILY 56 tablet 0   No current facility-administered medications for this visit.    HISTORY OF PRESENT ILLNESS:   Oncology History Overview Note   Cancer Staging  Breast cancer metastasized to  axillary lymph node, left (HCC) Staging form: Breast, AJCC 8th Edition - Clinical stage from 02/01/2023: Stage IV (cT2, cN1(f), cM1, G2, ER+, PR+, HER2: Equivocal) - Signed by Lanny Callander, MD on 02/08/2023 Stage prefix: Initial diagnosis Method of lymph node assessment: Fine needle aspiration Histologic grading system: 3 grade system  CLL (chronic lymphocytic leukemia) (HCC) Staging form: Chronic Lymphocytic Leukemia / Small Lymphocytic Lymphoma, AJCC 8th Edition - Clinical stage from 08/24/2021: Modified Rai Stage I (Modified Rai risk: Intermediate, Lymphocytosis: Present, Adenopathy: Present, Organomegaly: Absent, Anemia: Absent, Thrombocytopenia: Absent) - Signed by Lonn Hicks, MD on 09/03/2022 Stage prefix: Initial diagnosis     CLL (chronic lymphocytic leukemia) (HCC)  06/12/2012 Initial Diagnosis   CLL (chronic lymphocytic leukemia)   07/20/2014 Pathology Results   FISH was normal   08/24/2021 Cancer Staging   Staging form: Chronic Lymphocytic Leukemia / Small Lymphocytic Lymphoma, AJCC 8th Edition - Clinical stage from 08/24/2021: Modified Rai Stage I (Modified Rai risk: Intermediate, Lymphocytosis: Present, Adenopathy: Present, Organomegaly: Absent, Anemia: Absent, Thrombocytopenia: Absent) - Signed by Lonn Hicks, MD on 09/03/2022 Stage prefix: Initial diagnosis   09/03/2022 Imaging   1. Isolated enlarged 16 mm short axis left axillary node. No other lymphadenopathy in the chest, abdomen, or pelvis. 2. 2.1 cm right thyroid  nodule. Recommend thyroid  US  (ref: J Am Coll Radiol. 2015 Feb;12(2): 143-50). 3. Tiny bilateral nonobstructing renal stones.   03/18/2023 Genetic Testing   Negative genetic testing on the CancerNext-Expanded panel test.  The report date is March 18, 2023.  The CancerNext-Expanded gene panel offered by [employer] and includes sequencing and rearrangement analysis for the following 71 genes: AIP, ALK, APC, ATM, BAP1, BARD1, BMPR1A, BRCA1, BRCA2, BRIP1, CDC73,  CDH1, CDK4, CDKN1B, CDKN2A, CHEK2, DICER1, FH, FLCN, KIF1B, LZTR1, MAX, MEN1, MET, MLH1, MSH2, MSH6, MUTYH, NF1, NF2, NTHL1, PALB2, PHOX2B, PMS2, POT1, PRKAR1A, PTCH1, PTEN, RAD51C, RAD51D, RB1, RET, SDHA, SDHAF2, SDHB, SDHC, SDHD, SMAD4, SMARCA4, SMARCB1, SMARCE1, STK11, SUFU, TMEM127, TP53, TSC1, TSC2 and VHL (sequencing and deletion/duplication); AXIN2, CTNNA1, EGFR, EGLN1, HOXB13, KIT, MITF, MSH3, PDGFRA, POLD1 and POLE (sequencing only); EPCAM and GREM1 (deletion/duplication only). RNA data is routinely analyzed for use in variant interpretation for all genes.    Breast cancer metastasized to axillary lymph node, left (HCC)  01/23/2023 Imaging   IMPRESSION: 1. 2.3 cm mass in the 3 o'clock position of the left breast with imaging features highly suspicious for primary breast cancer. 2. 3.1 cm markedly enlarged left axillary lymph node most likely representing a metastatic node associated  with the patient's probable primary breast cancer. A single enlarged lymph node associated with the patient's chronic lymphocytic leukemia is less likely.   RECOMMENDATION: 1. Ultrasound-guided core needle biopsy of the 2.3 cm mass in the 3 o'clock position of the left breast. 2. Ultrasound-guided core needle biopsy of the 3.1 cm enlarged left axillary lymph node. This has been discussed with the patient and her husband and the biopsies have been scheduled at 12:45 p.m. on 01/29/2023.   01/29/2023 Pathology Results   Patient: Kaitlin Barnes Collected: 01/29/2023 Client: [facility] Accession: [id number] Received: 01/29/2023 Alm Parkins, MD DOB: [birth year] Age: 73 Gender: F Reported: 01/30/2023 [address] Patient Ph: [phone] MRN #: [id number] [place], [place] [address] Client Acc#: Chart #: [id number] Phone: [phone] Fax: CC: [facility] CC CC: Okey Carlin Redbird REPORT OF SURGICAL PATHOLOGY Addendum: Breast Biomarker Results FINAL DIAGNOSIS Diagnosis 1. Breast, left, needle  core biopsy, 3 o'clock, mass, coil clip - INVASIVE MAMMARY CARCINOMA, SEE NOTE - MAMMARY CARCINOMA IN SITU, INTERMEDIATE GRADE - TUBULE FORMATION: SCORE 3 - NUCLEAR PLEOMORPHISM: SCORE 3 - MITOTIC COUNT: SCORE 1 - TOTAL SCORE: 7 - OVERALL GRADE: 2 - LYMPHOVASCULAR INVASION: NOT IDENTIFIED - CANCER LENGTH: 1.3 CM - CALCIFICATIONS: PRESENT, RARE - OTHER FINDINGS: INTRADUCTAL PAPILLOMA WITH ASSOCIATED MICROCALCIFICATION 2. Lymph node, needle/core biopsy, axilla, lymph node, hydromark clip - METASTATIC CARCINOMA - GREATEST DIMENSION: 1.2 CM - NO DEFINITIVE LYMPHOID TISSUE IDENTIFIED - SEE COMMENT Diagnosis Note 1. -2. Dr. Belvie reviewed the case and concurs with the interpretation. Immunohistochemical staining for E-cadherin and a breast prognostic profile (ER, PR, Ki-67 and HER2) on block 1A is pending and will be reported in an addendum. 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  1. Breast, left, needle core biopsy, 3 o'clock, mass, coil clip PROGNOSTIC INDICATORS Results: IMMUNOHISTOCHEMICAL AND MORPHOMETRIC ANALYSIS PERFORMED MANUALLY The tumor cells are EQUIVOCAL for Her2 (2+). Her2 by FISH will be performed and the results reported separately. Estrogen Receptor: 100%, POSITIVE, STRONG STAINING INTENSITY Progesterone Receptor: 20%, POSITIVE, STRONG STAINING INTENSITY Proliferation Marker Ki67: 45%    02/01/2023 Initial Diagnosis   Breast cancer metastasized to axillary lymph node, left (HCC)   02/01/2023 Cancer Staging   Staging form: Breast, AJCC 8th Edition - Clinical stage from 02/01/2023: Stage IV (cT2, cN1(f), cM1, G2, ER+, PR+, HER2: Equivocal) - Signed by Lanny Callander, MD on 02/08/2023 Stage prefix: Initial diagnosis Method of lymph node assessment: Fine needle aspiration Histologic grading system: 3 grade system   02/07/2023 PET scan   1. Hypermetabolic left breast mass consistent with known primary breast malignancy. 2.  Hypermetabolic nodal metastases in the left axilla and subpectoral regions. 3. No evidence of metastatic disease within the abdomen or pelvis. 4. Several hypermetabolic osseous lesions in the thoracolumbar spine and right ribs, consistent with osseous metastatic disease. 5. Nonobstructing bilateral renal calculi.   03/18/2023 Genetic Testing   Negative genetic testing on the CancerNext-Expanded panel test.  The report date is March 18, 2023.  The CancerNext-Expanded gene panel offered by [employer] and includes sequencing and rearrangement analysis for the following 71 genes: AIP, ALK, APC, ATM, BAP1, BARD1, BMPR1A, BRCA1, BRCA2, BRIP1, CDC73, CDH1, CDK4, CDKN1B, CDKN2A, CHEK2, DICER1, FH, FLCN, KIF1B, LZTR1, MAX, MEN1, MET, MLH1, MSH2, MSH6, MUTYH, NF1, NF2, NTHL1, PALB2, PHOX2B, PMS2, POT1, PRKAR1A, PTCH1, PTEN, RAD51C, RAD51D, RB1, RET, SDHA, SDHAF2, SDHB, SDHC, SDHD, SMAD4, SMARCA4, SMARCB1, SMARCE1, STK11, SUFU, TMEM127, TP53, TSC1, TSC2 and VHL (sequencing and deletion/duplication); AXIN2, CTNNA1, EGFR,  EGLN1, HOXB13, KIT, MITF, MSH3, PDGFRA, POLD1 and POLE (sequencing only); EPCAM and GREM1 (deletion/duplication only). RNA data is routinely analyzed for use in variant interpretation for all genes.        REVIEW OF SYSTEMS:   Constitutional: Denies fevers, chills or abnormal weight loss Eyes: Denies blurriness of vision Ears, nose, mouth, throat, and face: Denies mucositis or sore throat Respiratory: Denies cough, dyspnea or wheezes Cardiovascular: Denies palpitation, chest discomfort or lower extremity swelling Gastrointestinal:  Denies nausea, heartburn or change in bowel habits.  States she has to limit dairy in her diet.  Also has to limit meat.  Used to do because significant GI upset. Skin: Denies abnormal skin rashes Lymphatics: Denies new lymphadenopathy or easy bruising Neurological:Denies numbness, tingling or new weaknesses Behavioral/Psych: Mood is stable, no new changes   All other systems were reviewed with the patient and are negative.   VITALS:   Today's Vitals   08/13/24 1259 08/13/24 1301  BP: (!) 167/70 (!) 157/76  Pulse: 77   Resp: 18   Temp: 97.7 F (36.5 C)   TempSrc: Temporal   SpO2: 98%   Weight: 157 lb 1.6 oz (71.3 kg)    Body mass index is 28.73 kg/m.   Wt Readings from Last 3 Encounters:  08/13/24 157 lb 1.6 oz (71.3 kg)  05/13/24 157 lb 6.4 oz (71.4 kg)  02/26/24 156 lb 14.4 oz (71.2 kg)    Body mass index is 28.73 kg/m.  Performance status (ECOG): 1 - Symptomatic but completely ambulatory  PHYSICAL EXAM:   GENERAL:alert, no distress and comfortable SKIN: skin color, texture, turgor are normal, no rashes or significant lesions EYES: normal, Conjunctiva are pink and non-injected, sclera clear OROPHARYNX:no exudate, no erythema and lips, buccal mucosa, and tongue normal  NECK: supple, thyroid  normal size, non-tender, without nodularity LYMPH:  no palpable lymphadenopathy in the cervical, axillary or inguinal LUNGS: clear to auscultation and percussion with normal breathing effort HEART: regular rate & rhythm and no murmurs and no lower extremity edema ABDOMEN:abdomen soft, non-tender and normal bowel sounds Musculoskeletal:no cyanosis of digits and no clubbing  NEURO: alert & oriented x 3 with fluent speech, no focal motor/sensory deficits BREAST: There are no palpable masses or lumps noted on today's visit.  There is no nipple inversion or nipple discharge.  There is no axillary lymphadenopathy on the right.  There is small, nodular mass located in the upper outer quadrant left breast.  General dense breast tissue throughout.  There is no nipple inversion or discharge.  There is no axillary lymphadenopathy on the left.  LABORATORY DATA:  I have reviewed the data as listed    Component Value Date/Time   NA 140 08/13/2024 1219   NA 143 07/19/2016 0910   K 4.1 08/13/2024 1219   K 4.6 07/19/2016 0910   CL 106 08/13/2024  1219   CL 104 12/11/2012 1408   CO2 25 08/13/2024 1219   CO2 28 07/19/2016 0910   GLUCOSE 105 (H) 08/13/2024 1219   GLUCOSE 115 07/19/2016 0910   GLUCOSE 94 12/11/2012 1408   BUN 16 08/13/2024 1219   BUN 11.5 07/19/2016 0910   CREATININE 0.78 08/13/2024 1219   CREATININE 0.8 07/19/2016 0910   CALCIUM 10.8 (H) 08/13/2024 1219   CALCIUM 10.6 (H) 07/19/2016 0910   PROT 6.2 (L) 08/13/2024 1219   PROT 6.9 07/19/2016 0910   ALBUMIN 4.3 08/13/2024 1219   ALBUMIN 3.8 07/19/2016 0910   AST 24 08/13/2024 1219   AST 28  07/19/2016 0910   ALT 23 08/13/2024 1219   ALT 52 07/19/2016 0910   ALKPHOS 87 08/13/2024 1219   ALKPHOS 114 07/19/2016 0910   BILITOT 0.3 08/13/2024 1219   BILITOT 0.32 07/19/2016 0910   GFRNONAA >60 08/13/2024 1219   GFRAA >60 07/22/2018 1111    Lab Results  Component Value Date   WBC 38.1 (H) 08/13/2024   NEUTROABS 3.1 08/13/2024   HGB 12.4 08/13/2024   HCT 36.5 08/13/2024   MCV 95.3 08/13/2024   PLT 249 08/13/2024

## 2024-08-13 ENCOUNTER — Encounter: Payer: Self-pay | Admitting: Nurse Practitioner

## 2024-08-13 ENCOUNTER — Inpatient Hospital Stay: Attending: Nurse Practitioner

## 2024-08-13 ENCOUNTER — Inpatient Hospital Stay (HOSPITAL_BASED_OUTPATIENT_CLINIC_OR_DEPARTMENT_OTHER): Admitting: Nurse Practitioner

## 2024-08-13 ENCOUNTER — Inpatient Hospital Stay

## 2024-08-13 VITALS — BP 157/76 | HR 77 | Temp 97.7°F | Resp 18 | Wt 157.1 lb

## 2024-08-13 DIAGNOSIS — C9110 Chronic lymphocytic leukemia of B-cell type not having achieved remission: Secondary | ICD-10-CM

## 2024-08-13 DIAGNOSIS — C50912 Malignant neoplasm of unspecified site of left female breast: Secondary | ICD-10-CM

## 2024-08-13 DIAGNOSIS — C773 Secondary and unspecified malignant neoplasm of axilla and upper limb lymph nodes: Secondary | ICD-10-CM | POA: Diagnosis not present

## 2024-08-13 DIAGNOSIS — C7951 Secondary malignant neoplasm of bone: Secondary | ICD-10-CM | POA: Insufficient documentation

## 2024-08-13 DIAGNOSIS — C50812 Malignant neoplasm of overlapping sites of left female breast: Secondary | ICD-10-CM | POA: Diagnosis present

## 2024-08-13 LAB — CBC WITH DIFFERENTIAL (CANCER CENTER ONLY)
Abs Immature Granulocytes: 0.04 K/uL (ref 0.00–0.07)
Basophils Absolute: 0.1 K/uL (ref 0.0–0.1)
Basophils Relative: 0 %
Eosinophils Absolute: 0.8 K/uL — ABNORMAL HIGH (ref 0.0–0.5)
Eosinophils Relative: 2 %
HCT: 36.5 % (ref 36.0–46.0)
Hemoglobin: 12.4 g/dL (ref 12.0–15.0)
Immature Granulocytes: 0 %
Lymphocytes Relative: 89 %
Lymphs Abs: 33.7 K/uL — ABNORMAL HIGH (ref 0.7–4.0)
MCH: 32.4 pg (ref 26.0–34.0)
MCHC: 34 g/dL (ref 30.0–36.0)
MCV: 95.3 fL (ref 80.0–100.0)
Monocytes Absolute: 0.4 K/uL (ref 0.1–1.0)
Monocytes Relative: 1 %
Neutro Abs: 3.1 K/uL (ref 1.7–7.7)
Neutrophils Relative %: 8 %
Platelet Count: 249 K/uL (ref 150–400)
RBC: 3.83 MIL/uL — ABNORMAL LOW (ref 3.87–5.11)
RDW: 13.9 % (ref 11.5–15.5)
Smear Review: NORMAL
WBC Count: 38.1 K/uL — ABNORMAL HIGH (ref 4.0–10.5)
nRBC: 0 % (ref 0.0–0.2)

## 2024-08-13 LAB — CMP (CANCER CENTER ONLY)
ALT: 23 U/L (ref 0–44)
AST: 24 U/L (ref 15–41)
Albumin: 4.3 g/dL (ref 3.5–5.0)
Alkaline Phosphatase: 87 U/L (ref 38–126)
Anion gap: 10 (ref 5–15)
BUN: 16 mg/dL (ref 8–23)
CO2: 25 mmol/L (ref 22–32)
Calcium: 10.8 mg/dL — ABNORMAL HIGH (ref 8.9–10.3)
Chloride: 106 mmol/L (ref 98–111)
Creatinine: 0.78 mg/dL (ref 0.44–1.00)
GFR, Estimated: >60 mL/min (ref >60)
Glucose, Bld: 105 mg/dL — ABNORMAL HIGH (ref 70–99)
Potassium: 4.1 mmol/L (ref 3.5–5.1)
Sodium: 140 mmol/L (ref 135–145)
Total Bilirubin: 0.3 mg/dL (ref 0.0–1.2)
Total Protein: 6.2 g/dL — ABNORMAL LOW (ref 6.5–8.1)

## 2024-08-13 MED ORDER — ZOLEDRONIC ACID 4 MG/100ML IV SOLN
4.0000 mg | Freq: Once | INTRAVENOUS | Status: AC
  Administered 2024-08-13: 4 mg via INTRAVENOUS
  Filled 2024-08-13: qty 100

## 2024-08-13 MED ORDER — SODIUM CHLORIDE 0.9 % IV SOLN: Freq: Once | INTRAVENOUS | Status: AC

## 2024-08-13 NOTE — Patient Instructions (Signed)
 [facility] - A DEPT OF [facility]  Discharge Instructions: Thank you for choosing [facility] to provide your oncology and hematology care.   If you have a lab appointment with the Cancer Center, please go directly to the Cancer Center and check in at the registration area.   Wear comfortable clothing and clothing appropriate for easy access to any Portacath or PICC line.   We strive to give you quality time with your provider. You may need to reschedule your appointment if you arrive late (15 or more minutes).  Arriving late affects you and other patients whose appointments are after yours.  Also, if you miss three or more appointments without notifying the office, you may be dismissed from the clinic at the provider's discretion.      For prescription refill requests, have your pharmacy contact our office and allow 72 hours for refills to be completed.    Today you received the following chemotherapy and/or immunotherapy agents ZOLEDRONIC  ACID (ZOMETA )       To help prevent nausea and vomiting after your treatment, we encourage you to take your nausea medication as directed.  BELOW ARE SYMPTOMS THAT SHOULD BE REPORTED IMMEDIATELY: *FEVER GREATER THAN 100.4 F (38 C) OR HIGHER *CHILLS OR SWEATING *NAUSEA AND VOMITING THAT IS NOT CONTROLLED WITH YOUR NAUSEA MEDICATION *UNUSUAL SHORTNESS OF BREATH *UNUSUAL BRUISING OR BLEEDING *URINARY PROBLEMS (pain or burning when urinating, or frequent urination) *BOWEL PROBLEMS (unusual diarrhea, constipation, pain near the anus) TENDERNESS IN MOUTH AND THROAT WITH OR WITHOUT PRESENCE OF ULCERS (sore throat, sores in mouth, or a toothache) UNUSUAL RASH, SWELLING OR PAIN  UNUSUAL VAGINAL DISCHARGE OR ITCHING   Items with * indicate a potential emergency and should be followed up as soon as possible or go to the Emergency Department if any problems should occur.  Please show the CHEMOTHERAPY ALERT CARD or  IMMUNOTHERAPY ALERT CARD at check-in to the Emergency Department and triage nurse.  Should you have questions after your visit or need to cancel or reschedule your appointment, please contact [facility] - A DEPT [facility]  Dept: [phone]  and follow the prompts.  Office hours are 8:00 a.m. to 4:30 p.m. Monday - Friday. Please note that voicemails left after 4:00 p.m. may not be returned until the following business day.  We are closed weekends and major holidays. You have access to a nurse at all times for urgent questions. Please call the main number to the clinic Dept: [phone] and follow the prompts.   For any non-urgent questions, you may also contact your provider using MyChart. We now offer e-Visits for anyone 40 and older to request care online for non-urgent symptoms. For details visit [email or web address].   Also download the MyChart app! Go to the app store, search MyChart, open the app, select [facility], and log in with your MyChart username and password.

## 2024-08-13 NOTE — Assessment & Plan Note (Addendum)
 Stage IV, ([id number]), ER+/PR+/HER2 low expression, Grade 2, with bone metastasis  -Patient presented with a palpable left breast mass and enlarged left axillary lymph nodes in April 2024.  Previous mammogram 1 year ago was negative. -I reviewed her breast and left axillary lymph node biopsy, both showed grade 2 invasive ductal carcinoma, ER 100% and PR 20% strongly positive, HER2 IHC 2+, FISH negative. -I personally reviewed her PET scan images with patient and her husband, it showed hypermetabolic left breast, left axilla and subpectoral nodes, and multiple spinal bone mets.  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   -Due to her metastatic disease, upfront breast surgery is not recommended.  Patient was seen by breast surgeon Dr. Aron  -I recommend first-line systemic therapy with aromatase inhibitor and CDK 4/6 inhibitor, anastrozole  and Verzenio , she started in June 2024.  Due to significant fatigue and diarrhea, Verzenio  was dose reduced to 50 mg twice daily.  She has been tolerating low dose well overall  -restaging PET in 10/2023 showed good response  - Continue anastrozole  and Verzenio  as prescribed. -Restaging PET/CT ordered for February 2026. -Zometa  infusion given 08/13/2024.  Treatment plan updated for every 6 months.  Will be due next in 01/2025. -Plan for labs and follow-up in 3 months or sooner if needed.

## 2024-08-13 NOTE — Assessment & Plan Note (Signed)
 -  stage 0, no indication for therapy -Will continue new monitoring CBC

## 2024-08-15 ENCOUNTER — Other Ambulatory Visit: Payer: Self-pay | Admitting: Nurse Practitioner

## 2024-09-05 ENCOUNTER — Other Ambulatory Visit: Payer: Self-pay | Admitting: Nurse Practitioner

## 2024-09-06 ENCOUNTER — Other Ambulatory Visit: Payer: Self-pay | Admitting: Hematology

## 2024-09-11 ENCOUNTER — Other Ambulatory Visit: Payer: Self-pay | Admitting: Nurse Practitioner

## 2024-09-18 ENCOUNTER — Telehealth: Payer: Self-pay

## 2024-09-18 NOTE — Telephone Encounter (Addendum)
 PAP reenrollment for year 2026    Application has been submitted for Verzenio  through [employer] with both Patient and doctor signatures, along with requested documentation.  Status: Approved through 09/23/2025  Charlott Hamilton,  CPhT-Adv  she/her/hers [facility]  [facility] Pharmacy Technician [facility] Phone: [phone]  Fax: [phone] [email or web address]

## 2024-10-04 ENCOUNTER — Other Ambulatory Visit: Payer: Self-pay | Admitting: Hematology

## 2024-10-13 ENCOUNTER — Other Ambulatory Visit: Payer: Self-pay | Admitting: Nurse Practitioner

## 2024-10-29 ENCOUNTER — Other Ambulatory Visit: Payer: Self-pay | Admitting: Nurse Practitioner

## 2024-11-05 ENCOUNTER — Ambulatory Visit (HOSPITAL_COMMUNITY)

## 2024-11-13 ENCOUNTER — Inpatient Hospital Stay: Attending: Nurse Practitioner

## 2024-11-13 ENCOUNTER — Inpatient Hospital Stay

## 2024-11-13 ENCOUNTER — Inpatient Hospital Stay: Admitting: Nurse Practitioner
